# Patient Record
Sex: Male | Born: 1935
Health system: Southern US, Community
[De-identification: ages and names within clinical notes are randomized; demographics above are authoritative.]

## PROBLEM LIST (undated history)

## (undated) ENCOUNTER — Inpatient Hospital Stay: Admission: EM | Payer: Self-pay | Source: Home / Self Care

## (undated) DIAGNOSIS — I714 Abdominal aortic aneurysm, without rupture, unspecified: Secondary | ICD-10-CM

## (undated) DIAGNOSIS — I8290 Acute embolism and thrombosis of unspecified vein: Secondary | ICD-10-CM

## (undated) DIAGNOSIS — N189 Chronic kidney disease, unspecified: Secondary | ICD-10-CM

## (undated) DIAGNOSIS — J189 Pneumonia, unspecified organism: Secondary | ICD-10-CM

## (undated) DIAGNOSIS — I10 Essential (primary) hypertension: Secondary | ICD-10-CM

## (undated) DIAGNOSIS — I2510 Atherosclerotic heart disease of native coronary artery without angina pectoris: Secondary | ICD-10-CM

## (undated) DIAGNOSIS — M1990 Unspecified osteoarthritis, unspecified site: Secondary | ICD-10-CM

## (undated) DIAGNOSIS — K649 Unspecified hemorrhoids: Secondary | ICD-10-CM

## (undated) DIAGNOSIS — K219 Gastro-esophageal reflux disease without esophagitis: Secondary | ICD-10-CM

## (undated) DIAGNOSIS — E079 Disorder of thyroid, unspecified: Secondary | ICD-10-CM

## (undated) DIAGNOSIS — C801 Malignant (primary) neoplasm, unspecified: Secondary | ICD-10-CM

## (undated) HISTORY — PX: ABDOMINAL AORTIC ANEURYSM REPAIR: SUR1152

## (undated) HISTORY — PX: EYE SURGERY: SHX253

## (undated) HISTORY — DX: Abdominal aortic aneurysm, without rupture: I71.4

## (undated) HISTORY — DX: Gastro-esophageal reflux disease without esophagitis: K21.9

## (undated) HISTORY — DX: Acute embolism and thrombosis of unspecified vein: I82.90

## (undated) HISTORY — DX: Atherosclerotic heart disease of native coronary artery without angina pectoris: I25.10

## (undated) HISTORY — DX: Chronic kidney disease, unspecified: N18.9

## (undated) HISTORY — DX: Unspecified osteoarthritis, unspecified site: M19.90

## (undated) HISTORY — DX: Abdominal aortic aneurysm, without rupture, unspecified: I71.40

## (undated) HISTORY — DX: Essential (primary) hypertension: I10

## (undated) HISTORY — PX: MOUTH SURGERY: SHX715

---

## 2007-07-15 ENCOUNTER — Ambulatory Visit: Payer: Self-pay | Admitting: Vascular Surgery

## 2007-07-23 ENCOUNTER — Ambulatory Visit (HOSPITAL_COMMUNITY): Admission: RE | Admit: 2007-07-23 | Discharge: 2007-07-23 | Payer: Self-pay | Admitting: Vascular Surgery

## 2007-07-25 ENCOUNTER — Ambulatory Visit: Payer: Self-pay | Admitting: Cardiology

## 2007-07-25 ENCOUNTER — Encounter (HOSPITAL_COMMUNITY): Admission: RE | Admit: 2007-07-25 | Discharge: 2007-08-24 | Payer: Self-pay | Admitting: Vascular Surgery

## 2007-07-29 ENCOUNTER — Ambulatory Visit: Payer: Self-pay | Admitting: Vascular Surgery

## 2008-02-03 ENCOUNTER — Ambulatory Visit: Payer: Self-pay | Admitting: Vascular Surgery

## 2008-07-27 ENCOUNTER — Ambulatory Visit: Payer: Self-pay | Admitting: Vascular Surgery

## 2009-01-25 ENCOUNTER — Ambulatory Visit: Payer: Self-pay | Admitting: Vascular Surgery

## 2009-08-11 ENCOUNTER — Ambulatory Visit: Payer: Self-pay | Admitting: Vascular Surgery

## 2010-03-24 ENCOUNTER — Ambulatory Visit: Payer: Self-pay | Admitting: Vascular Surgery

## 2010-08-28 ENCOUNTER — Other Ambulatory Visit: Payer: Self-pay | Admitting: Ophthalmology

## 2010-08-28 ENCOUNTER — Encounter (HOSPITAL_COMMUNITY): Payer: Medicare Other

## 2010-08-28 LAB — BASIC METABOLIC PANEL
CO2: 23 mEq/L (ref 19–32)
Calcium: 10.2 mg/dL (ref 8.4–10.5)
Chloride: 108 mEq/L (ref 96–112)
Creatinine, Ser: 2.21 mg/dL — ABNORMAL HIGH (ref 0.4–1.5)
Glucose, Bld: 112 mg/dL — ABNORMAL HIGH (ref 70–99)

## 2010-09-04 ENCOUNTER — Ambulatory Visit (HOSPITAL_COMMUNITY)
Admission: RE | Admit: 2010-09-04 | Discharge: 2010-09-04 | Disposition: A | Discharge status: Home / Self Care | Payer: Medicare Other | Source: Ambulatory Visit | Attending: Ophthalmology | Admitting: Ophthalmology

## 2010-09-04 DIAGNOSIS — Z79899 Other long term (current) drug therapy: Secondary | ICD-10-CM | POA: Insufficient documentation

## 2010-09-04 DIAGNOSIS — Z7982 Long term (current) use of aspirin: Secondary | ICD-10-CM | POA: Insufficient documentation

## 2010-09-04 DIAGNOSIS — Z01812 Encounter for preprocedural laboratory examination: Secondary | ICD-10-CM | POA: Insufficient documentation

## 2010-09-04 DIAGNOSIS — H2589 Other age-related cataract: Secondary | ICD-10-CM | POA: Insufficient documentation

## 2010-09-04 DIAGNOSIS — I10 Essential (primary) hypertension: Secondary | ICD-10-CM | POA: Insufficient documentation

## 2010-09-05 NOTE — Procedures (Signed)
DUPLEX ULTRASOUND OF ABDOMINAL AORTA   INDICATION:  Followup abdominal aortic aneurysm.   HISTORY:  Diabetes:  No.  Cardiac:  No.  Hypertension:  Yes.  Smoking:  Previous.  Connective Tissue Disorder:  Family History:  No.  Previous Surgery:  No.   DUPLEX EXAM:         AP (cm)                   TRANSVERSE (cm)  Proximal             2.48 cm                   2.50 cm  Mid                  3.31 cm                   3.69 cm  Distal               4.84 cm                   4.93 cm  Right Iliac          1.78 cm                   cm  Left Iliac           1.65 cm                   cm   PREVIOUS:  Date:  01/25/2009  AP:  4.9  TRANSVERSE:  4.8   IMPRESSION:  Stable abdominal aortic aneurysm with largest measurement  today of 4.84 cm x 4.93 cm.   ___________________________________________  Nelda Severe. Kellie Simmering, M.D.   AS/MEDQ  D:  08/11/2009  T:  08/11/2009  Job:  [id number]

## 2010-09-05 NOTE — Procedures (Signed)
DUPLEX ULTRASOUND OF ABDOMINAL AORTA   INDICATION:  Abdominal aortic aneurysm.   HISTORY:  Diabetes:  No.  Cardiac:  No.  Hypertension:  Yes.  Smoking:  Previous.  Connective Tissue Disorder:  Family History:  No.  Previous Surgery:  No.   DUPLEX EXAM:         AP (cm)                   TRANSVERSE (cm)  Proximal             2.95 cm                   2.8 cm  Mid                  4.9 cm                    4.8 cm  Distal               4.7 cm                    4.5 cm  Right Iliac          Not visualized            Not visualized  Left Iliac           Not visualized            Not visualized   PREVIOUS:  Date:  07/27/2008  AP:  4.62  TRANSVERSE:  4.76   IMPRESSION:  1. Aneurysm of the mid to distal abdominal aorta with no significant      change in maximum diameter.  2. Unable to adequately visualize the bilateral iliac arteries due to      overlying bowel gas patterns.   ___________________________________________  Nelda Severe Kellie Simmering, M.D.   CH/MEDQ  D:  01/26/2009  T:  01/26/2009  Job:  [id number]

## 2010-09-05 NOTE — Assessment & Plan Note (Signed)
OFFICE VISIT   [name], Gerald T  DOB:  1929/03/15                                       02/03/2008  [id number]   The patient returns today for further followup regarding his abdominal  aortic aneurysm which we have been following.  He did have a CT scan in  April of this year which revealed the aneurysm to be slightly smaller  than had been appreciated on the duplex scan, which had suggested it was  5.2 cm in diameter from [facility].  That study was done in  April 2009 as well.  Today, a duplex scan in our office revealed the  aneurysm had a maximum diameter of 4.7 x 4.43 cm.  I reviewed the CT  scan again today and his neck measures about 27.5 cm in diameter below  the renal arteries, and the neck is fairly short and the aorta is quite  torturous.  I think he is very borderline as a candidate for stent  graft.  He does have mild renal insufficiency, which apparently has  improved slightly during his last check.  His creatinine has run in the  range of 1.8 in the past.  He denies any abdominal or back pain, other  than his chronic back discomfort and has no hemispheric or non-  hemispheric TIA by history, chest pain, dyspnea on exertion, PND, or  orthopnea.   PHYSICAL EXAMINATION:  Today, blood pressure is 130/84, heart rate 65,  respirations 18.  Chest is clear to auscultation.  Carotid pulses 3+  with no bruits.  Neurologic exam is normal.  Abdomen is soft and  nontender with a small pulsatile mass noted at approximately 5 cm, which  is nontender.  He has 3+ femoral and popliteal pulses bilaterally with  well-perfused lower extremities.   We will continue to follow his aneurysm since it is only 4.7 cm in  maximum diameter, and I will see him back in 6 months with a followup  duplex scan at that time.  If he develops any sudden abdominal or flank  pain, he will report to the emergency room immediately.   Nelda Severe Kellie Simmering, M.D.  Electronically Signed   JDL/MEDQ  D:  02/03/2008  T:  02/04/2008  Job:  [id number]

## 2010-09-05 NOTE — Consult Note (Signed)
VASCULAR SURGERY CONSULTATION   [name], [name]  DOB:  03-17-1929                                       07/16/2007  [id number]   The patient was referred for vascular surgery consultation by Dr. Nadara Mustard  for recently diagnosed infrarenal abdominal aortic aneurysm.  This 77-  year-old healthy gentleman has had some mild renal dysfunction over the  past few years, and underwent a renal ultrasound, which revealed a  5.2x4.8 cm infrarenal abdominal aortic aneurysm, which had not been  previously identified.  He had chronic renal disease with no asymmetry  in renal size.  He also had bilateral renal cysts.  He was not aware  that he had previously had the aneurysm.  His serum creatinine on March  9 this year was 1.84 with a BUN 34 and it was repeated 1 week later and  was 1.64.   PAST MEDICAL HISTORY:  1. Hypertension.  2. Negative for diabetes, coronary artery disease, MI, COPD, stroke,      or hyperlipidemia.   PREVIOUS SURGERY:  None.   FAMILY HISTORY:  Positive for coronary artery disease in his mother,  stroke in his father, negative for diabetes.   SOCIAL HISTORY:  He is married and has 2 children.  Is retired.  He has  not smoked cigarettes in approximately 30 years.  He does not use  alcohol.   REVIEW OF SYSTEMS:  He does have reflux esophagitis symptoms on  occasion.  He has chronic kidney disease as noted, and some diffuse arthritis.  He had 1 episode of blurred vision in the left eye lasting 10 minutes,  which has not recurred.  He has no history of TIA.   ALLERGIES:  None.   MEDICATIONS:  Ranitidine 150 mg b.i.d.  Levothyroxine 150 mg 1 daily.  Hydrochlorothiazide 50 mg 1 daily.  Lisinopril 50 mg 1 daily.   PHYSICAL EXAM:  Blood pressure 137/76, heart rate 62, respirations 14.  In general, he is a healthy-appearing male in no acute distress.  He is  alert and oriented x3.  His neck is supple.  3+ carotid pulses palpable.  No bruits are audible.   Neurologic exam is normal.  No palpable  adenopathy in the neck.  No skin rashes are noted.  Upper extremity  pulses are 3+ bilaterally.  Chest is clear to auscultation.  Cardiovascular exam reveals a regular rhythm with no murmurs.  His  abdomen is soft and nontender with a pulsatile mass in the  midepigastrium.  This approximates 5 cm in diameter.  He has 3+ femoral,  popliteal, and dorsalis pedis pulses bilaterally and 2+ posterior tibial  pulses.  No distal edema is noted.   I discussed the aneurysm with the patient today and we will proceed with  further evaluation, including a CT scan with no contrast since he does  have some mild renal insufficiency.  Will also obtain a Cardiolite and  he will return in 2 weeks for [place] to discuss this finding.   Nelda Severe Kellie Simmering, M.D.  Electronically Signed  JDL/MEDQ  D:  07/15/2007  T:  07/16/2007  Job:  [id number]   cc:   Rory Percy

## 2010-09-05 NOTE — Procedures (Signed)
DUPLEX ULTRASOUND OF ABDOMINAL AORTA   INDICATION:  Abdominal aortic aneurysm.   HISTORY:  Diabetes:  No.  Cardiac:  No.  Hypertension:  Yes.  Smoking:  Previous.  Connective Tissue Disorder:  Family History:  No.  Previous Surgery:  No.   DUPLEX EXAM:         AP (cm)                   TRANSVERSE (cm)  Proximal             2.5 cm                    2.9 cm  Mid                  3.7 cm                    4.0 cm  Distal               4.9 cm                    4.8 cm  Right Iliac          1.6 cm                    1.6 cm  Left Iliac           1.4 cm                    1.5 cm   PREVIOUS:  Date:  08/11/2009  AP:  4.84  TRANSVERSE:  4.93   IMPRESSION:  1. Aneurysmal dilatation of the mid to distal abdominal aorta with no      significant change in maximum diameter when compared to the      previous exam.  2. Doppler velocities of the abdominal aorta and bilateral common      iliac arteries appear within normal limits.   ___________________________________________  Nelda Severe Kellie Simmering, M.D.   CH/MEDQ  D:  03/24/2010  T:  03/24/2010  Job:  [id number]

## 2010-09-05 NOTE — Procedures (Signed)
DUPLEX ULTRASOUND OF ABDOMINAL AORTA   INDICATION:  Follow up abdominal aortic aneurysm.   HISTORY:  Diabetes:  No.  Cardiac:  No.  Hypertension:  Yes.  Smoking:  Quit.  Connective Tissue Disorder:  Family History:  No.  Previous Surgery:  No.   DUPLEX EXAM:         AP (cm)                   TRANSVERSE (cm)  Proximal             3.33 cm                   3.45 cm  Mid                  4.62 cm                   4.65 cm  Distal               4.46 cm                   4.76 cm  Right Iliac          1.69 cm                   1.61 cm  Left Iliac           1.65 cm                   1.61 cm   PREVIOUS:  Date: 02/03/2008  AP:  4.7  TRANSVERSE:  4.43   IMPRESSION:  Stable abdominal aortic aneurysm with largest measurement  of 4.46 cm x 4.76 cm.   ___________________________________________  Nelda Severe. Kellie Simmering, M.D.   AS/MEDQ  D:  07/27/2008  T:  07/27/2008  Job:  [id number]

## 2010-09-05 NOTE — Assessment & Plan Note (Signed)
OFFICE VISIT   Niblett, Vilas T  DOB:  1929-02-08                                       07/29/2007  [id number]   The patient returns today having had a Myoview performed or cardiac  clearance for his aneurysm surgery which we are contemplating.  His  Myoview was normal with good ventricular function and no ischemia.  He  also had a noncontrast CT scan which I reviewed.  The CT scan reveals  the diameter of the aneurysm to be significantly less than the  ultrasound revealed.  The ultrasound revealed 5.2 x 4.8 done at [facility]  and the CT scan reveals maximum diameter of 4.3 because of tortuosity of  the aneurysm.  I do not think the aneurysm needs treatment at this time  but does need close followup.  I discussed this at length with him and  his son and wife.  I also discussed symptoms that would be present if  the aneurysm leaked.   His blood pressure today is 163/78, heart rate 54, respirations 14.  He  is stable hemodynamically.  I will see him back in 6 months with a  followup duplex scan in our office since we have not performed an  ultrasound in our office before.  We will then continue to follow it on  a regular basis.  He does have mild renal insufficiency with a  creatinine of 1.8, BUN of 34 although those numbers have decreased  somewhat.   Nelda Severe Kellie Simmering, M.D.  Electronically Signed   JDL/MEDQ  D:  07/29/2007  T:  07/30/2007  Job:  [id number]   cc:   Rory Percy

## 2010-09-05 NOTE — Procedures (Signed)
DUPLEX ULTRASOUND OF ABDOMINAL AORTA   INDICATION:  Followup abdominal aortic aneurysm per CT.   HISTORY:  Diabetes:  No.  Cardiac:  No.  Hypertension:  Yes.  Smoking:  Quit about 30 years ago.  Connective Tissue Disorder:  Family History:  Previous Surgery:   DUPLEX EXAM:         AP (cm)                   TRANSVERSE (cm)  Proximal             3.49 cm                   3.65 cm  Mid                  4.27 cm                   4.09 cm  Distal               4.70 cm                   4.43 cm  Right Iliac          1.59 cm                   1.29 cm  Left Iliac           1.43 cm                   1.42 cm   PREVIOUS:  Date:  AP:  4.3 per CT  TRANSVERSE:  4.3 per CT   IMPRESSION:  A bilobar abdominal aortic aneurysm noted measuring 4.27 cm  x 4.09 cm and 4.70 cm x 4.43 cm.   ___________________________________________  Nelda Severe. Kellie Simmering, M.D.   MG/MEDQ  D:  02/03/2008  T:  02/03/2008  Job:  [id number]

## 2010-10-02 NOTE — Op Note (Signed)
  NAME:  [name], [name]                   ACCOUNT NO.:  [id number]  MEDICAL RECORD NO.:  [id number]           PATIENT TYPE:  LOCATION:                                 FACILITY:  PHYSICIAN:  Richardo Hanks, MD       DATE OF BIRTH:  [birth year]  DATE OF PROCEDURE:  09/05/2010 DATE OF DISCHARGE:  09/05/2010                              OPERATIVE REPORT   PREOPERATIVE DIAGNOSIS:  Combined cataract, right eye, diagnosis code 366.19.  POSTOPERATIVE DIAGNOSIS:  Combined cataract, right eye, diagnosis code 366.19.  PROSTHETIC DEVICE USED:  Lenstec posterior chamber lens model Softec HD. The power is 28.75, serial number is [id number].          ______________________________ Richardo Hanks, MD     KEH/MEDQ  D:  09/04/2010  T:  09/04/2010  Job:  [id number]  Electronically Signed by Tonny Branch MD on 10/02/2010 12:21:50 PM

## 2010-10-17 ENCOUNTER — Encounter (INDEPENDENT_AMBULATORY_CARE_PROVIDER_SITE_OTHER): Payer: Medicare Other

## 2010-10-17 ENCOUNTER — Ambulatory Visit (INDEPENDENT_AMBULATORY_CARE_PROVIDER_SITE_OTHER): Payer: Medicare Other | Admitting: Vascular Surgery

## 2010-10-17 DIAGNOSIS — I714 Abdominal aortic aneurysm, without rupture: Secondary | ICD-10-CM

## 2010-10-17 NOTE — Assessment & Plan Note (Signed)
OFFICE VISIT  [name] DOB:  01-14-1929                                       10/17/2010 [id number]  This patient returns today for continued follow-up regarding his infrarenal abdominal aneurysm which was diagnosed in 2009 at [facility].  This was proximal 5.2 cm initially but follow-up ultrasound in our office has always been in the 4.8 to 4.9 cm range.  He remains asymptomatic.  The previous CT scan showed the neck to be fairly short with a tortuous aorta and he was a very borderline candidate for stent graft at best.  Also some mild renal insufficiency with creatinine of 1.8 and has been followed by Dr. Lowanda Foster.  CHRONIC MEDICAL PROBLEMS: 1. Hypertension. 2. Chronic renal insufficiency. 3. Cataracts, previous surgery. 4. Negative for coronary artery disease, diabetes, COPD or stroke.  SOCIAL HISTORY:  Married.  He is retired.  He has not smoked since 1975. He does not use alcohol.  FAMILY HISTORY:  Positive for coronary artery disease and renal failure in his mother and stroke in father.  Negative for diabetes.  REVIEW OF SYSTEMS:  Negative for chest pain, dyspnea on exertion, PND, orthopnea, no claudication.  Does have chronic renal insufficiency.  All other systems are negative in complete review of systems.  PHYSICAL EXAMINATION:  Blood pressure 130/86, heart rate is 55, respirations 20.  General:  This is a well-developed, well-nourished male who is in no apparent distress, alert and oriented x3.  HEENT: Exam normal for age.  EOMs intact.  Lungs:  Clear to auscultation.  No rhonchi or wheezing.  Cardiovascular:  Regular rhythm, no murmurs. Carotid pulses 3+, no audible bruits.  Abdomen:  Soft, nontender with 5 cm pulsatile mass.  Musculoskeletal:  Free of major deformities. Neurologic:  Normal.  Skin:  Free of rashes.  Lower extremities:  Exam reveals 3+ femoral and posterior tibial pulses palpable bilaterally.  Today I  ordered a duplex scan of the aneurysm which I have reviewed and interpreted.  The maximum diameter today appears to be 4.8 x 4.7 cm.  I reassured him regarding these findings.  We will see him in 9 months with a follow-up duplex scan of the office for further evaluation.  He will also be followed by Dr. Lowanda Foster for his chronic renal insufficiency.    Nelda Severe Kellie Simmering, M.D. Electronically Signed  JDL/MEDQ  D:  10/17/2010  Walker:  10/17/2010  Job:  [id number]  cc:   Rory Percy, MD

## 2010-10-26 NOTE — Procedures (Unsigned)
DUPLEX ULTRASOUND OF ABDOMINAL AORTA  INDICATION:  Follow up abdominal aortic aneurysm.  HISTORY: Diabetes:  No. Cardiac:  No. Hypertension:  Yes. Smoking:  Previous. Connective Tissue Disorder: Family History:  No. Previous Surgery:  No.  DUPLEX EXAM:         AP (cm)                   TRANSVERSE (cm) Proximal             2.31 cm Mid                  3.24 cm                   3.37 cm Distal               4.73 cm                   4.81 cm Right Iliac          1.49 cm Left Iliac           1.64 cm  PREVIOUS:  Date: 03/24/2010  AP:  4.9  TRANSVERSE:  4.8  IMPRESSION: 1. Abdominal aortic aneurysm shows no significant changes from a     previous study done 03/24/2010. 2. Aortic and common iliac artery velocities appear within normal     limits.  ___________________________________________ Nelda Severe Kellie Simmering, M.D.  AS/MEDQ  D:  10/17/2010  T:  10/17/2010  Job:  [id number]

## 2011-07-17 ENCOUNTER — Ambulatory Visit: Payer: Medicare Other | Admitting: Vascular Surgery

## 2011-08-02 ENCOUNTER — Encounter: Payer: Self-pay | Admitting: Vascular Surgery

## 2011-08-07 ENCOUNTER — Ambulatory Visit: Payer: Medicare Other | Admitting: Vascular Surgery

## 2011-08-13 ENCOUNTER — Encounter: Payer: Self-pay | Admitting: Vascular Surgery

## 2011-08-14 ENCOUNTER — Ambulatory Visit (INDEPENDENT_AMBULATORY_CARE_PROVIDER_SITE_OTHER): Payer: Medicare Other | Admitting: Vascular Surgery

## 2011-08-14 ENCOUNTER — Encounter: Payer: Self-pay | Admitting: Vascular Surgery

## 2011-08-14 VITALS — BP 148/78 | HR 57 | Resp 16 | Ht 72.0 in | Wt 199.6 lb

## 2011-08-14 DIAGNOSIS — I714 Abdominal aortic aneurysm, without rupture, unspecified: Secondary | ICD-10-CM

## 2011-08-14 DIAGNOSIS — Z48812 Encounter for surgical aftercare following surgery on the circulatory system: Secondary | ICD-10-CM

## 2011-08-14 NOTE — Progress Notes (Signed)
Subjective:     Patient ID: Carmelina Dane, male   DOB: Sep 07, 1928, 78 y.o.   MRN: [id number]  HPI is 78 year old male patient returns for further follow up regarding his abdominal aortic aneurysm which we have been following since 2009. It has hovered around 5 cm in maximum diameter. He denies any new abdominal or back symptoms. He has had no new medical problems since the last saw him one year ago.  Past Medical History  Diagnosis Date  . AAA (abdominal aortic aneurysm)   . GERD (gastroesophageal reflux disease)   . Arthritis   . Hypertension   . Chronic kidney disease     Renal insufficiency  . Coronary artery disease     History  Substance Use Topics  . Smoking status: Former Smoker    Quit date: 07/22/1973  . Smokeless tobacco: Not on file  . Alcohol Use: No    Family History  Problem Relation Age of Onset  . Coronary artery disease Mother   . Kidney disease Mother   . Stroke Father     Not on File  Current outpatient prescriptions:fish oil-omega-3 fatty acids 1000 MG capsule, Take 2 g by mouth daily., Disp: , Rfl: ;  levothyroxine (SYNTHROID, LEVOTHROID) 150 MCG tablet, Take 150 mcg by mouth daily., Disp: , Rfl: ;  lisinopril-hydrochlorothiazide (PRINZIDE,ZESTORETIC) 10-12.5 MG per tablet, Take 1 tablet by mouth daily., Disp: , Rfl: ;  Multiple Vitamins-Minerals (MULTIVITAMIN WITH MINERALS) tablet, Take 1 tablet by mouth daily., Disp: , Rfl:  ranitidine (ZANTAC) 150 MG tablet, Take 150 mg by mouth 2 (two) times daily., Disp: , Rfl: ;  aspirin 81 MG tablet, Take 81 mg by mouth daily., Disp: , Rfl:   BP 148/78  Pulse 57  Resp 16  Ht 6' (1.829 m)  Wt 199 lb 9.6 oz (90.538 kg)  BMI 27.07 kg/m2  SpO2 98%  Body mass index is 27.07 kg/(m^2).          Review of Systems denies chest pain, dyspnea on exertion, PND, orthopnea, or Trinity claudication. Does have mild renal insufficiency with recent creatinine of 1.95. Other systems are negative and a complete review of  systems     Objective:   Physical Exam blood pressure 140/78 heart rate 57 respirations 16 Gen.-alert and oriented x3 in no apparent distress HEENT normal for age Lungs no rhonchi or wheezing Cardiovascular regular rhythm no murmurs carotid pulses 3+ palpable no bruits audible Abdomen soft nontender-5 cm pulsatile mass in mid epigastrium which is nontender.  Musculoskeletal free of  major deformities Skin clear -no rashes Neurologic normal Lower extremities 3+ femoral and dorsalis pedis pulses palpable bilaterally with no edema  Today I ordered an abdominal duplex scan which I reviewed and interpreted. The aneurysm has increased slightly in size measuring 5 x 5.1 cm     Assessment:     Infrarenal abdominal aortic aneurysm 5.1 cm maximum diameter Previous CT scan in 09 revealed that this has a short angulated neck and is not likely a stent graft candidate Discussed possible options with patient and his wife including proceeding with treatment now versus waiting 3-6 months and repeating a CT scan which they chose to do    Plan:     Return 3 months with CT scan-no contrast If patient's wife decided they would like to proceed with that they will call and we will move this up Patient will need Cardiolite prior to surgical procedure and may well require open repair

## 2011-08-16 NOTE — Progress Notes (Signed)
Addended by: Mena Goes on: 08/16/2011 08:41 AM   Modules accepted: Orders

## 2011-08-21 NOTE — Procedures (Unsigned)
DUPLEX ULTRASOUND OF ABDOMINAL AORTA  INDICATION:  Abdominal aortic aneurysm.  HISTORY: Diabetes:  No. Cardiac:  No. Hypertension:  Yes. Smoking:  Previous. Connective Tissue Disorder: Family History:  No. Previous Surgery:  No.  DUPLEX EXAM:         AP (cm)                   TRANSVERSE (cm) Proximal             2.49 cm                   2.67 cm Mid                  4.99 cm                   5.07 cm Distal               2.94 cm                   3.08 cm Right Iliac          1.34 cm                   1.48 cm Left Iliac           1.37 cm                   1.57 cm  PREVIOUS:  Date: 10/17/2010  AP:  4.73  TRANSVERSE:  4.81  IMPRESSION: 1. Infrarenal abdominal aortic aneurysm present measuring 4.99 cm x     5.07 cm, with intramural thrombus present. 2. Slight increase in diameter since previous study on 10/17/2010.  ___________________________________________ Nelda Severe. Kellie Simmering, M.D.  SH/MEDQ  D:  08/14/2011  T:  08/14/2011  Job:  [id number]

## 2011-11-12 ENCOUNTER — Encounter: Payer: Self-pay | Admitting: Vascular Surgery

## 2011-11-13 ENCOUNTER — Encounter: Payer: Self-pay | Admitting: Vascular Surgery

## 2011-11-13 ENCOUNTER — Ambulatory Visit
Admission: RE | Admit: 2011-11-13 | Discharge: 2011-11-13 | Disposition: A | Discharge status: Home / Self Care | Payer: Medicare Other | Source: Ambulatory Visit | Attending: Vascular Surgery | Admitting: Vascular Surgery

## 2011-11-13 ENCOUNTER — Ambulatory Visit (INDEPENDENT_AMBULATORY_CARE_PROVIDER_SITE_OTHER): Payer: Medicare Other | Admitting: Vascular Surgery

## 2011-11-13 ENCOUNTER — Other Ambulatory Visit: Payer: Self-pay | Admitting: *Deleted

## 2011-11-13 VITALS — BP 161/79 | HR 53 | Temp 97.9°F | Ht 72.0 in | Wt 198.0 lb

## 2011-11-13 DIAGNOSIS — Z48812 Encounter for surgical aftercare following surgery on the circulatory system: Secondary | ICD-10-CM

## 2011-11-13 DIAGNOSIS — Z01810 Encounter for preprocedural cardiovascular examination: Secondary | ICD-10-CM

## 2011-11-13 DIAGNOSIS — I714 Abdominal aortic aneurysm, without rupture: Secondary | ICD-10-CM

## 2011-11-13 NOTE — Addendum Note (Signed)
Addended by: Mena Goes on: 11/13/2011 12:47 PM   Modules accepted: Orders

## 2011-11-13 NOTE — Progress Notes (Signed)
Subjective:     Patient ID: [name], male   DOB: Mar 04, 1929, 78 y.o.   MRN: [id number]  HPI this healthy 78 year old male returns today for further followup regarding his abdominal aortic aneurysm which I have followed for the past 5-6 years. He had a CT scan today without contrast which I have reviewed by computer. The aneurysm has now increased in size to 6 cm in diameter. It extends up to the renal arteries and is not a candidate for aortic stent grafting. Today I had a long discussion with the patient's family including the sons and the wife regarding this surgery.  Past Medical History  Diagnosis Date  . AAA (abdominal aortic aneurysm)   . GERD (gastroesophageal reflux disease)   . Arthritis   . Hypertension   . Chronic kidney disease     Renal insufficiency  . Coronary artery disease     History  Substance Use Topics  . Smoking status: Former Smoker    Types: Cigarettes    Quit date: 07/22/1973  . Smokeless tobacco: Never Used  . Alcohol Use: No    Family History  Problem Relation Age of Onset  . Coronary artery disease Mother   . Kidney disease Mother   . Heart attack Mother   . Stroke Father   . Hypertension Father     No Known Allergies  Current outpatient prescriptions:aspirin 81 MG tablet, Take 81 mg by mouth daily., Disp: , Rfl: ;  fish oil-omega-3 fatty acids 1000 MG capsule, Take 2 g by mouth daily., Disp: , Rfl: ;  levothyroxine (SYNTHROID, LEVOTHROID) 150 MCG tablet, Take 150 mcg by mouth daily., Disp: , Rfl: ;  lisinopril-hydrochlorothiazide (PRINZIDE,ZESTORETIC) 10-12.5 MG per tablet, Take 1 tablet by mouth daily., Disp: , Rfl:  Multiple Vitamins-Minerals (MULTIVITAMIN WITH MINERALS) tablet, Take 1 tablet by mouth daily., Disp: , Rfl: ;  ranitidine (ZANTAC) 150 MG tablet, Take 150 mg by mouth 2 (two) times daily., Disp: , Rfl:   BP 161/79  Pulse 53  Temp 97.9 F (36.6 C) (Oral)  Ht 6' (1.829 m)  Wt 198 lb (89.812 kg)  BMI 26.85 kg/m2  SpO2  100%  Body mass index is 26.85 kg/(m^2).           Review of Systems denies chest pain, dyspnea on exertion, PND, orthopnea, claudication, lateralizing weakness.     Objective:   Physical Exam blood pressure 161/79 heart rate 53 respirations 16 Gen.-alert and oriented x3 in no apparent distress HEENT normal for age Lungs no rhonchi or wheezing Cardiovascular regular rhythm no murmurs carotid pulses 3+ palpable no bruits audible Abdomen soft nontender , small umbilical hernia, 5-6 cm pulsatile mass nontender Musculoskeletal free of  major deformities Skin clear -no rashes Neurologic normal Lower extremities 3+ femoral and dorsalis pedis pulses palpable bilaterally with no edema      Assessment:     I reviewed his CT scan today and agree that he does have a bilobed infrarenal abdominal aortic aneurysm with maximum diameter of near 6 cm. This extends to the renal arteries.    Plan:     Obtain Cardiolite for cardiac clearance #2 schedule resection and grafting of abdominal aortic aneurysm for Wednesday, August 14 Risks and benefits discussed completely with patient and family including 3% mortality rate. If patient develops any back or flank pain in the interim he will report to the emergency department immediately

## 2011-11-19 ENCOUNTER — Encounter (HOSPITAL_COMMUNITY): Payer: Self-pay | Admitting: Pharmacy Technician

## 2011-11-19 ENCOUNTER — Ambulatory Visit (HOSPITAL_COMMUNITY): Payer: Medicare Other | Attending: Cardiology | Admitting: Radiology

## 2011-11-19 VITALS — BP 136/77 | Ht 72.0 in | Wt 195.0 lb

## 2011-11-19 DIAGNOSIS — I714 Abdominal aortic aneurysm, without rupture, unspecified: Secondary | ICD-10-CM | POA: Insufficient documentation

## 2011-11-19 DIAGNOSIS — Z01810 Encounter for preprocedural cardiovascular examination: Secondary | ICD-10-CM | POA: Insufficient documentation

## 2011-11-19 DIAGNOSIS — R9431 Abnormal electrocardiogram [ECG] [EKG]: Secondary | ICD-10-CM

## 2011-11-19 MED ORDER — REGADENOSON 0.4 MG/5ML IV SOLN
0.4000 mg | Freq: Once | INTRAVENOUS | Status: AC
  Administered 2011-11-19: 0.4 mg via INTRAVENOUS

## 2011-11-19 MED ORDER — TECHNETIUM TC 99M TETROFOSMIN IV KIT
30.0000 | PACK | Freq: Once | INTRAVENOUS | Status: AC | PRN
  Administered 2011-11-19: 30 via INTRAVENOUS

## 2011-11-19 MED ORDER — TECHNETIUM TC 99M TETROFOSMIN IV KIT
10.0000 | PACK | Freq: Once | INTRAVENOUS | Status: AC | PRN
  Administered 2011-11-19: 10 via INTRAVENOUS

## 2011-11-19 NOTE — Progress Notes (Signed)
[address] [place] [address] [phone]  Cardiology Nuclear Med Study  [name] is a 78 y.o. male     MRN : [id number]     DOB: Sep 23, 1928  Procedure Date: 11/19/2011  Nuclear Med Background Indication for Stress Test:  Evaluation for Ischemia, and Surgical Clearance for  AAA Repair on 12-05-11 by Dr.James Kellie Simmering History:  AAA at 6.0cm and '09 [id number] and EF=65% Cardiac Risk Factors: Family History - CAD, History of Smoking, Hypertension and PVD  Symptoms:  no symptoms   Nuclear Pre-Procedure Caffeine/Decaff Intake:  None X 12 hrs NPO After: 6:30pm   Lungs:  clear O2 Sat: 95% on RA IV 0.9% NS with Angio Cath:  20g  IV Site: R Antecubital x 1, tolerated well IV Started by:  Irven Baltimore, RN  Chest Size (in):  46 Cup Size: n/a  Height: 6' (1.829 m)  Weight:  195 lb (88.451 kg)  BMI:  Body mass index is 26.45 kg/(m^2). Tech Comments:  n/a    Nuclear Med Study 1 or 2 day study: 1 day  Stress Test Type:  Lexiscan  Reading MD: Kirk Ruths, MD  Order Authorizing Provider:  Tinnie Gens, MD  Resting Radionuclide: Technetium 78m Tetrofosmin  Resting Radionuclide Dose: 11.0 mCi   Stress Radionuclide:  Technetium 25m Tetrofosmin  Stress Radionuclide Dose: 33.0 mCi           Stress Protocol Rest HR: 50 Stress HR: 64  Rest BP: 136/77 Stress BP: 145/85  Exercise Time (min): n/a METS: n/a   Predicted Max HR: 139 bpm % Max HR: 46.76 bpm Rate Pressure Product: 9425   Dose of Adenosine (mg):  n/a Dose of Lexiscan: 0.4 mg  Dose of Atropine (mg): n/a Dose of Dobutamine: n/a mcg/kg/min (at max HR)  Stress Test Technologist: Matilde Haymaker, RN  Nuclear Technologist:  Charlton Amor, CNMT     Rest Procedure:  Myocardial perfusion imaging was performed at rest 45 minutes following the intravenous administration of Technetium 25m Tetrofosmin. Rest ECG: Sinus Bradycardia  Stress Procedure:  The patient received IV  Lexiscan 0.4 mg over 15-seconds.  Technetium 26m Tetrofosmin injected at 30-seconds.  There were no significant changes with Lexiscan.  Quantitative spect images were obtained after a 45 minute delay. Stress ECG: No significant ST segment change suggestive of ischemia.  QPS Raw Data Images:  Acquisition technically good; normal left ventricular size. Stress Images:  Normal homogeneous uptake in all areas of the myocardium. Rest Images:  Normal homogeneous uptake in all areas of the myocardium. Subtraction (SDS):  No evidence of ischemia. Transient Ischemic Dilatation (Normal <1.22):  1.14 Lung/Heart Ratio (Normal <0.45):  0.34  Quantitative Gated Spect Images QGS EDV:  100 ml QGS ESV:  40 ml  Impression Exercise Capacity:  Lexiscan with no exercise. BP Response:  Normal blood pressure response. Clinical Symptoms:  No chest pain or dyspnea. ECG Impression:  No significant ST segment change suggestive of ischemia. Comparison with Prior Nuclear Study: No significant change from previous study  Overall Impression:  Normal stress nuclear study.  LV Ejection Fraction: 60%.  LV Wall Motion:  NL LV Function; NL Wall Motion  Kirk Ruths

## 2011-11-28 ENCOUNTER — Ambulatory Visit (HOSPITAL_COMMUNITY)
Admission: RE | Admit: 2011-11-28 | Discharge: 2011-11-28 | Disposition: A | Discharge status: Home / Self Care | Payer: PRIVATE HEALTH INSURANCE | Source: Ambulatory Visit | Attending: Vascular Surgery | Admitting: Vascular Surgery

## 2011-11-28 ENCOUNTER — Encounter (HOSPITAL_COMMUNITY): Payer: Self-pay

## 2011-11-28 ENCOUNTER — Encounter (HOSPITAL_COMMUNITY)
Admission: RE | Admit: 2011-11-28 | Discharge: 2011-11-28 | Disposition: A | Discharge status: Home / Self Care | Payer: PRIVATE HEALTH INSURANCE | Source: Ambulatory Visit | Attending: Vascular Surgery | Admitting: Vascular Surgery

## 2011-11-28 DIAGNOSIS — J438 Other emphysema: Secondary | ICD-10-CM | POA: Insufficient documentation

## 2011-11-28 DIAGNOSIS — K228 Other specified diseases of esophagus: Secondary | ICD-10-CM | POA: Insufficient documentation

## 2011-11-28 DIAGNOSIS — Z01812 Encounter for preprocedural laboratory examination: Secondary | ICD-10-CM | POA: Insufficient documentation

## 2011-11-28 DIAGNOSIS — I714 Abdominal aortic aneurysm, without rupture, unspecified: Secondary | ICD-10-CM | POA: Insufficient documentation

## 2011-11-28 DIAGNOSIS — Z01810 Encounter for preprocedural cardiovascular examination: Secondary | ICD-10-CM | POA: Insufficient documentation

## 2011-11-28 DIAGNOSIS — K2289 Other specified disease of esophagus: Secondary | ICD-10-CM | POA: Insufficient documentation

## 2011-11-28 DIAGNOSIS — Z01818 Encounter for other preprocedural examination: Secondary | ICD-10-CM | POA: Insufficient documentation

## 2011-11-28 HISTORY — DX: Unspecified hemorrhoids: K64.9

## 2011-11-28 LAB — CBC
HCT: 38.8 % — ABNORMAL LOW (ref 39.0–52.0)
Hemoglobin: 13 g/dL (ref 13.0–17.0)
MCV: 94.6 fL (ref 78.0–100.0)
RBC: 4.1 MIL/uL — ABNORMAL LOW (ref 4.22–5.81)
RDW: 13.5 % (ref 11.5–15.5)
WBC: 8.5 10*3/uL (ref 4.0–10.5)

## 2011-11-28 LAB — APTT: aPTT: 36 seconds (ref 24–37)

## 2011-11-28 LAB — BLOOD GAS, ARTERIAL
Bicarbonate: 23.2 mEq/L (ref 20.0–24.0)
TCO2: 24.4 mmol/L (ref 0–100)
pCO2 arterial: 40.5 mmHg (ref 35.0–45.0)
pH, Arterial: 7.376 (ref 7.350–7.450)
pO2, Arterial: 94.9 mmHg (ref 80.0–100.0)

## 2011-11-28 LAB — COMPREHENSIVE METABOLIC PANEL
Albumin: 3.5 g/dL (ref 3.5–5.2)
Alkaline Phosphatase: 65 U/L (ref 39–117)
BUN: 23 mg/dL (ref 6–23)
CO2: 22 mEq/L (ref 19–32)
Chloride: 111 mEq/L (ref 96–112)
Creatinine, Ser: 1.74 mg/dL — ABNORMAL HIGH (ref 0.50–1.35)
GFR calc Af Amer: 41 mL/min — ABNORMAL LOW (ref >90)
GFR calc non Af Amer: 35 mL/min — ABNORMAL LOW (ref >90)
Glucose, Bld: 88 mg/dL (ref 70–99)
Potassium: 5.1 mEq/L (ref 3.5–5.1)
Total Bilirubin: 0.5 mg/dL (ref 0.3–1.2)

## 2011-11-28 LAB — URINALYSIS, ROUTINE W REFLEX MICROSCOPIC
Glucose, UA: NEGATIVE mg/dL
Hgb urine dipstick: NEGATIVE
Ketones, ur: NEGATIVE mg/dL
Protein, ur: NEGATIVE mg/dL
Urobilinogen, UA: 0.2 mg/dL (ref 0.0–1.0)

## 2011-11-28 LAB — ABO/RH: ABO/RH(D): O POS

## 2011-11-28 NOTE — Progress Notes (Signed)
Per Arbie Cookey at Dr. Evelena Leyden office obtain PT/INR

## 2011-11-28 NOTE — Pre-Procedure Instructions (Signed)
[place]  11/28/2011   Your procedure is scheduled on:  August 14  Report to [place] at 06:30 AM.  Call this number if you have problems the morning of surgery: [phone]   Remember:   Do not eat or drink:After Midnight.  Take these medicines the morning of surgery with A SIP OF WATER: Synthroid, Zantac   STOP fish oil and multiple vitamins after today  Do not wear jewelry, make-up or nail polish.  Do not wear lotions, powders, or perfumes. You may wear deodorant.  Do not shave 48 hours prior to surgery. Men may shave face and neck.  Do not bring valuables to the hospital.  Contacts, dentures or bridgework may not be worn into surgery.  Leave suitcase in the car. After surgery it may be brought to your room.  For patients admitted to the hospital, checkout time is 11:00 AM the day of discharge.   Special Instructions: CHG Shower Use Special Wash: 1/2 bottle night before surgery and 1/2 bottle morning of surgery.   Please read over the following fact sheets that you were given: Pain Booklet, Coughing and Deep Breathing, Blood Transfusion Information and Surgical Site Infection Prevention

## 2011-11-29 NOTE — Consult Note (Addendum)
Anesthesia chart review: Patient is an 78 year old male scheduled for open repair of AAA on 12/05/2011 by Dr. Kellie Simmering.  History includes former smoker, AAA, GERD, HTN, CKD, OA, CAD (not specified and without documented history of previous coronary intervention).  PCP is listed as Dr. Rory Percy.  Dr. Ashley Akin is his Nephrologist.  EKG on 11/28/11 showed SB @ 51 bpm.  He had a normal nuclear stress test on 11/19/11.  LV Ejection Fraction: 60%. LV Wall Motion: NL LV Function; NL Wall Motion.  CXR report on 11/28/11 showed: Heart size and pulmonary vascularity are normal. There  is tortuosity of the thoracic aorta. The esophagus is dilated as demonstrated on prior CT scan. The patient has emphysematous disease at throughout both lungs. No acute osseous abnormality.   CT scan of the ABD/pelvis on 11/13/11 showed: 1. Fusiform infrarenal abdominal aortic aneurysm measures up to 6.0 cm in diameter. Bilateral common iliac artery aneurysms are also identified. No hemorrhage.  2. Single small gallstone. No evidence of cholecystitis.  3. Bilateral renal cysts.  4. Enlarged prostate gland.  5. Emphysema.  6. Fat containing umbilical hernia.  7. Fluid in the distal esophagus compatible with poor motility  and/or reflux disease.  Labs noted.  K 5.1, Cr 1.74 (improved from 2.21 in May 2012), H/H 13.0/38.8, PLT 136, coags WNL.  Labs from [facility] on 06/26/11 show a Cr 1.95.  Michaele Offer, RN at [facility] is aware of BMET results.  She will review with Dr. Kellie Simmering.  He has known CKD with a stable/improved Cr since last year.  His recent stress test was normal.  Anticipate he can proceed as planned.  Dr. Kellie Simmering can follow-up patient's renal function closely in the post-operative period.  Myra Gianotti, PA-C

## 2011-12-04 MED ORDER — DEXTROSE 5 % IV SOLN
1.5000 g | INTRAVENOUS | Status: AC
  Administered 2011-12-05: 1.5 g via INTRAVENOUS
  Filled 2011-12-04: qty 1.5

## 2011-12-05 ENCOUNTER — Inpatient Hospital Stay (HOSPITAL_COMMUNITY): Payer: Medicare Other

## 2011-12-05 ENCOUNTER — Encounter (HOSPITAL_COMMUNITY)
Admission: RE | Disposition: A | Discharge status: Rehabilitation Facility | Payer: Self-pay | Source: Ambulatory Visit | Attending: Vascular Surgery

## 2011-12-05 ENCOUNTER — Inpatient Hospital Stay (HOSPITAL_COMMUNITY): Payer: Medicare Other | Admitting: Certified Registered Nurse Anesthetist

## 2011-12-05 ENCOUNTER — Encounter (HOSPITAL_COMMUNITY): Payer: Self-pay | Admitting: *Deleted

## 2011-12-05 ENCOUNTER — Encounter (HOSPITAL_COMMUNITY): Payer: Self-pay | Admitting: Certified Registered Nurse Anesthetist

## 2011-12-05 ENCOUNTER — Inpatient Hospital Stay (HOSPITAL_COMMUNITY)
Admission: RE | Admit: 2011-12-05 | Discharge: 2011-12-13 | DRG: 237 | Disposition: A | Discharge status: Rehabilitation Facility | Payer: Medicare Other | Source: Ambulatory Visit | Attending: Vascular Surgery | Admitting: Vascular Surgery

## 2011-12-05 ENCOUNTER — Encounter (HOSPITAL_COMMUNITY): Payer: Self-pay | Admitting: Vascular Surgery

## 2011-12-05 ENCOUNTER — Inpatient Hospital Stay (HOSPITAL_COMMUNITY): Payer: Medicare Other | Admitting: Vascular Surgery

## 2011-12-05 DIAGNOSIS — D62 Acute posthemorrhagic anemia: Secondary | ICD-10-CM | POA: Diagnosis not present

## 2011-12-05 DIAGNOSIS — I214 Non-ST elevation (NSTEMI) myocardial infarction: Secondary | ICD-10-CM | POA: Diagnosis present

## 2011-12-05 DIAGNOSIS — T82898A Other specified complication of vascular prosthetic devices, implants and grafts, initial encounter: Secondary | ICD-10-CM | POA: Diagnosis present

## 2011-12-05 DIAGNOSIS — N183 Chronic kidney disease, stage 3 unspecified: Secondary | ICD-10-CM | POA: Diagnosis present

## 2011-12-05 DIAGNOSIS — I714 Abdominal aortic aneurysm, without rupture, unspecified: Principal | ICD-10-CM

## 2011-12-05 DIAGNOSIS — M129 Arthropathy, unspecified: Secondary | ICD-10-CM | POA: Diagnosis present

## 2011-12-05 DIAGNOSIS — J95821 Acute postprocedural respiratory failure: Secondary | ICD-10-CM

## 2011-12-05 DIAGNOSIS — N189 Chronic kidney disease, unspecified: Secondary | ICD-10-CM

## 2011-12-05 DIAGNOSIS — K219 Gastro-esophageal reflux disease without esophagitis: Secondary | ICD-10-CM

## 2011-12-05 DIAGNOSIS — D649 Anemia, unspecified: Secondary | ICD-10-CM | POA: Diagnosis present

## 2011-12-05 DIAGNOSIS — D696 Thrombocytopenia, unspecified: Secondary | ICD-10-CM | POA: Diagnosis present

## 2011-12-05 DIAGNOSIS — E875 Hyperkalemia: Secondary | ICD-10-CM | POA: Diagnosis present

## 2011-12-05 DIAGNOSIS — I10 Essential (primary) hypertension: Secondary | ICD-10-CM

## 2011-12-05 DIAGNOSIS — K560 Paralytic ileus: Secondary | ICD-10-CM | POA: Diagnosis present

## 2011-12-05 DIAGNOSIS — I129 Hypertensive chronic kidney disease with stage 1 through stage 4 chronic kidney disease, or unspecified chronic kidney disease: Secondary | ICD-10-CM | POA: Diagnosis present

## 2011-12-05 DIAGNOSIS — R7989 Other specified abnormal findings of blood chemistry: Secondary | ICD-10-CM

## 2011-12-05 DIAGNOSIS — I2510 Atherosclerotic heart disease of native coronary artery without angina pectoris: Secondary | ICD-10-CM

## 2011-12-05 DIAGNOSIS — R748 Abnormal levels of other serum enzymes: Secondary | ICD-10-CM

## 2011-12-05 DIAGNOSIS — E872 Acidosis, unspecified: Secondary | ICD-10-CM | POA: Diagnosis present

## 2011-12-05 DIAGNOSIS — E039 Hypothyroidism, unspecified: Secondary | ICD-10-CM | POA: Diagnosis present

## 2011-12-05 DIAGNOSIS — Y832 Surgical operation with anastomosis, bypass or graft as the cause of abnormal reaction of the patient, or of later complication, without mention of misadventure at the time of the procedure: Secondary | ICD-10-CM | POA: Diagnosis present

## 2011-12-05 DIAGNOSIS — I743 Embolism and thrombosis of arteries of the lower extremities: Secondary | ICD-10-CM

## 2011-12-05 DIAGNOSIS — I998 Other disorder of circulatory system: Secondary | ICD-10-CM

## 2011-12-05 DIAGNOSIS — Z01812 Encounter for preprocedural laboratory examination: Secondary | ICD-10-CM

## 2011-12-05 DIAGNOSIS — J9600 Acute respiratory failure, unspecified whether with hypoxia or hypercapnia: Secondary | ICD-10-CM | POA: Diagnosis present

## 2011-12-05 DIAGNOSIS — D72829 Elevated white blood cell count, unspecified: Secondary | ICD-10-CM | POA: Diagnosis present

## 2011-12-05 DIAGNOSIS — Z87891 Personal history of nicotine dependence: Secondary | ICD-10-CM

## 2011-12-05 HISTORY — PX: EMBOLECTOMY: SHX44

## 2011-12-05 HISTORY — PX: INTRAOPERATIVE ARTERIOGRAM: SHX5157

## 2011-12-05 HISTORY — PX: ABDOMINAL AORTIC ANEURYSM REPAIR: SHX42

## 2011-12-05 HISTORY — PX: ARTERY EXPLORATION: SHX5110

## 2011-12-05 LAB — BASIC METABOLIC PANEL
CO2: 22 mEq/L (ref 19–32)
Calcium: 7.8 mg/dL — ABNORMAL LOW (ref 8.4–10.5)
GFR calc Af Amer: 42 mL/min — ABNORMAL LOW (ref >90)
Sodium: 143 mEq/L (ref 135–145)

## 2011-12-05 LAB — POCT I-STAT 7, (LYTES, BLD GAS, ICA,H+H)
Acid-base deficit: 7 mmol/L — ABNORMAL HIGH (ref 0.0–2.0)
Bicarbonate: 21 mEq/L (ref 20.0–24.0)
Bicarbonate: 23.5 mEq/L (ref 20.0–24.0)
Hemoglobin: 9.9 g/dL — ABNORMAL LOW (ref 13.0–17.0)
Sodium: 141 mEq/L (ref 135–145)
TCO2: 23 mmol/L (ref 0–100)
TCO2: 25 mmol/L (ref 0–100)
pCO2 arterial: 48.9 mmHg — ABNORMAL HIGH (ref 35.0–45.0)
pH, Arterial: 7.29 — ABNORMAL LOW (ref 7.350–7.450)

## 2011-12-05 LAB — CBC
MCH: 32.3 pg (ref 26.0–34.0)
MCHC: 33.9 g/dL (ref 30.0–36.0)
MCV: 95.5 fL (ref 78.0–100.0)
Platelets: 124 10*3/uL — ABNORMAL LOW (ref 150–400)
RBC: 3.37 MIL/uL — ABNORMAL LOW (ref 4.22–5.81)
RDW: 13.7 % (ref 11.5–15.5)

## 2011-12-05 LAB — CARDIAC PANEL(CRET KIN+CKTOT+MB+TROPI)
CK, MB: 6 ng/mL — ABNORMAL HIGH (ref 0.3–4.0)
Relative Index: 3.8 — ABNORMAL HIGH (ref 0.0–2.5)
Total CK: 158 U/L (ref 7–232)

## 2011-12-05 LAB — BLOOD GAS, ARTERIAL
Acid-base deficit: 3.5 mmol/L — ABNORMAL HIGH (ref 0.0–2.0)
Drawn by: 296031
O2 Content: 6 L/min
O2 Saturation: 96.3 %
Patient temperature: 98.6
TCO2: 22.5 mmol/L (ref 0–100)
pCO2 arterial: 40.4 mmHg (ref 35.0–45.0)

## 2011-12-05 LAB — APTT: aPTT: 36 seconds (ref 24–37)

## 2011-12-05 SURGERY — EMBOLECTOMY
Anesthesia: General | Site: Leg Lower | Laterality: Right | Wound class: Clean

## 2011-12-05 SURGERY — ANEURYSM ABDOMINAL AORTIC REPAIR
Anesthesia: General | Site: Leg Lower | Laterality: Right | Wound class: Clean

## 2011-12-05 MED ORDER — SODIUM CHLORIDE 0.9 % IR SOLN
Status: DC | PRN
  Administered 2011-12-05: 22:00:00

## 2011-12-05 MED ORDER — LABETALOL HCL 5 MG/ML IV SOLN
10.0000 mg | INTRAVENOUS | Status: DC | PRN
  Administered 2011-12-05: 10 mg via INTRAVENOUS
  Filled 2011-12-05: qty 4

## 2011-12-05 MED ORDER — LISINOPRIL 10 MG PO TABS: 10.0000 mg | ORAL_TABLET | Freq: Every day | ORAL | Status: DC

## 2011-12-05 MED ORDER — DIPHENHYDRAMINE HCL 12.5 MG/5ML PO ELIX
12.5000 mg | ORAL_SOLUTION | Freq: Four times a day (QID) | ORAL | Status: DC | PRN
  Filled 2011-12-05: qty 5

## 2011-12-05 MED ORDER — SODIUM CHLORIDE 0.9 % IV SOLN
INTRAVENOUS | Status: DC | PRN
  Administered 2011-12-05: 21:00:00 via INTRAVENOUS

## 2011-12-05 MED ORDER — ONDANSETRON HCL 4 MG/2ML IJ SOLN: 4.0000 mg | Freq: Four times a day (QID) | INTRAMUSCULAR | Status: DC | PRN

## 2011-12-05 MED ORDER — LEVOTHYROXINE SODIUM 150 MCG PO TABS: 150.0000 ug | ORAL_TABLET | Freq: Every day | ORAL | Status: DC

## 2011-12-05 MED ORDER — MAGNESIUM SULFATE 40 MG/ML IJ SOLN
2.0000 g | Freq: Once | INTRAMUSCULAR | Status: AC | PRN
  Administered 2011-12-05: 2 g via INTRAVENOUS
  Filled 2011-12-05: qty 100

## 2011-12-05 MED ORDER — PROPOFOL 10 MG/ML IV EMUL
INTRAVENOUS | Status: DC | PRN
  Administered 2011-12-05: 120 mg via INTRAVENOUS
  Administered 2011-12-05: 80 mg via INTRAVENOUS

## 2011-12-05 MED ORDER — HEPARIN (PORCINE) IN NACL 100-0.45 UNIT/ML-% IJ SOLN
1200.0000 [IU]/h | INTRAMUSCULAR | Status: DC
  Administered 2011-12-06: 800 [IU]/h via INTRAVENOUS
  Administered 2011-12-07: 1200 [IU]/h via INTRAVENOUS
  Filled 2011-12-05 (×4): qty 250

## 2011-12-05 MED ORDER — SODIUM CHLORIDE 0.9 % IJ SOLN
10.0000 mL | Freq: Two times a day (BID) | INTRAMUSCULAR | Status: DC
  Administered 2011-12-06 – 2011-12-07 (×4): 10 mL via INTRAVENOUS

## 2011-12-05 MED ORDER — BIOTENE DRY MOUTH MT LIQD
15.0000 mL | Freq: Two times a day (BID) | OROMUCOSAL | Status: DC
  Administered 2011-12-05: 15 mL via OROMUCOSAL

## 2011-12-05 MED ORDER — ESMOLOL HCL 10 MG/ML IV SOLN
INTRAVENOUS | Status: DC | PRN
  Administered 2011-12-05: 50 mg via INTRAVENOUS

## 2011-12-05 MED ORDER — SODIUM CHLORIDE 0.9 % IV SOLN: 500.0000 mL | Freq: Once | INTRAVENOUS | Status: AC | PRN

## 2011-12-05 MED ORDER — CHLORHEXIDINE GLUCONATE 0.12 % MT SOLN
15.0000 mL | Freq: Two times a day (BID) | OROMUCOSAL | Status: DC
  Administered 2011-12-06 – 2011-12-12 (×15): 15 mL via OROMUCOSAL
  Filled 2011-12-05 (×18): qty 15

## 2011-12-05 MED ORDER — SODIUM CHLORIDE 0.9 % IJ SOLN
10.0000 mL | INTRAMUSCULAR | Status: DC | PRN
  Administered 2011-12-12: 10 mL via INTRAVENOUS

## 2011-12-05 MED ORDER — 0.9 % SODIUM CHLORIDE (POUR BTL) OPTIME
TOPICAL | Status: DC | PRN
  Administered 2011-12-05: 100 mL

## 2011-12-05 MED ORDER — LISINOPRIL 10 MG PO TABS
10.0000 mg | ORAL_TABLET | Freq: Every day | ORAL | Status: DC
  Filled 2011-12-05: qty 1

## 2011-12-05 MED ORDER — HYDROMORPHONE 0.3 MG/ML IV SOLN
INTRAVENOUS | Status: DC
  Administered 2011-12-05: 25 mL via INTRAVENOUS

## 2011-12-05 MED ORDER — DOPAMINE-DEXTROSE 3.2-5 MG/ML-% IV SOLN: 3.0000 ug/kg/min | INTRAVENOUS | Status: DC | PRN

## 2011-12-05 MED ORDER — FENTANYL CITRATE 0.05 MG/ML IJ SOLN
INTRAMUSCULAR | Status: DC | PRN
  Administered 2011-12-05: 50 ug via INTRAVENOUS
  Administered 2011-12-05 (×3): 100 ug via INTRAVENOUS
  Administered 2011-12-05 (×3): 50 ug via INTRAVENOUS

## 2011-12-05 MED ORDER — ONDANSETRON HCL 4 MG/2ML IJ SOLN
INTRAMUSCULAR | Status: DC | PRN
  Administered 2011-12-05: 4 mg via INTRAVENOUS

## 2011-12-05 MED ORDER — NITROGLYCERIN IN D5W 200-5 MCG/ML-% IV SOLN
INTRAVENOUS | Status: DC | PRN
  Administered 2011-12-05: 20 ug/min via INTRAVENOUS

## 2011-12-05 MED ORDER — LACTATED RINGERS IV SOLN
INTRAVENOUS | Status: DC | PRN
  Administered 2011-12-05: 23:00:00 via INTRAVENOUS

## 2011-12-05 MED ORDER — HYDROMORPHONE HCL PF 1 MG/ML IJ SOLN
0.2500 mg | INTRAMUSCULAR | Status: DC | PRN
  Administered 2011-12-05 (×2): 0.5 mg via INTRAVENOUS

## 2011-12-05 MED ORDER — ACETAMINOPHEN 325 MG PO TABS: 325.0000 mg | ORAL_TABLET | ORAL | Status: DC | PRN

## 2011-12-05 MED ORDER — PHENOL 1.4 % MT LIQD
1.0000 | OROMUCOSAL | Status: DC | PRN
  Filled 2011-12-05: qty 177

## 2011-12-05 MED ORDER — HYDROCHLOROTHIAZIDE 12.5 MG PO CAPS: 12.5000 mg | ORAL_CAPSULE | Freq: Every day | ORAL | Status: DC

## 2011-12-05 MED ORDER — LACTATED RINGERS IV SOLN
INTRAVENOUS | Status: DC | PRN
  Administered 2011-12-05 (×2): via INTRAVENOUS

## 2011-12-05 MED ORDER — LACTATED RINGERS IV SOLN
INTRAVENOUS | Status: DC | PRN
  Administered 2011-12-05 (×2): via INTRAVENOUS

## 2011-12-05 MED ORDER — DIPHENHYDRAMINE HCL 12.5 MG/5ML PO ELIX: 12.5000 mg | ORAL_SOLUTION | Freq: Four times a day (QID) | ORAL | Status: DC | PRN

## 2011-12-05 MED ORDER — DEXTROSE 5 % IV SOLN
1.5000 g | Freq: Two times a day (BID) | INTRAVENOUS | Status: AC
  Administered 2011-12-05 – 2011-12-06 (×2): 1.5 g via INTRAVENOUS
  Filled 2011-12-05 (×2): qty 1.5

## 2011-12-05 MED ORDER — SODIUM CHLORIDE 0.9 % IV SOLN: INTRAVENOUS | Status: DC

## 2011-12-05 MED ORDER — IOHEXOL 300 MG/ML  SOLN
INTRAMUSCULAR | Status: DC | PRN
  Administered 2011-12-05: 50 mL via INTRAVENOUS

## 2011-12-05 MED ORDER — SODIUM CHLORIDE 0.9 % IR SOLN
Status: DC | PRN
  Administered 2011-12-05: 10:00:00

## 2011-12-05 MED ORDER — GLYCOPYRROLATE 0.2 MG/ML IJ SOLN
INTRAMUSCULAR | Status: DC | PRN
  Administered 2011-12-05: .8 mg via INTRAVENOUS

## 2011-12-05 MED ORDER — ALUM & MAG HYDROXIDE-SIMETH 200-200-20 MG/5ML PO SUSP: 15.0000 mL | ORAL | Status: DC | PRN

## 2011-12-05 MED ORDER — 0.9 % SODIUM CHLORIDE (POUR BTL) OPTIME
TOPICAL | Status: DC | PRN
  Administered 2011-12-05: 1000 mL

## 2011-12-05 MED ORDER — DIPHENHYDRAMINE HCL 50 MG/ML IJ SOLN: 12.5000 mg | Freq: Four times a day (QID) | INTRAMUSCULAR | Status: DC | PRN

## 2011-12-05 MED ORDER — HYDRALAZINE HCL 20 MG/ML IJ SOLN
INTRAMUSCULAR | Status: DC | PRN
  Administered 2011-12-05: 5 mg via INTRAVENOUS
  Administered 2011-12-05: 10 mg via INTRAVENOUS
  Administered 2011-12-05: 5 mg via INTRAVENOUS

## 2011-12-05 MED ORDER — POTASSIUM CHLORIDE CRYS ER 20 MEQ PO TBCR: 20.0000 meq | EXTENDED_RELEASE_TABLET | Freq: Every day | ORAL | Status: DC | PRN

## 2011-12-05 MED ORDER — HEPARIN SODIUM (PORCINE) 1000 UNIT/ML IJ SOLN
INTRAMUSCULAR | Status: AC
  Filled 2011-12-05: qty 1

## 2011-12-05 MED ORDER — NALOXONE HCL 0.4 MG/ML IJ SOLN: 0.4000 mg | INTRAMUSCULAR | Status: DC | PRN

## 2011-12-05 MED ORDER — HYDROCHLOROTHIAZIDE 12.5 MG PO CAPS
12.5000 mg | ORAL_CAPSULE | Freq: Every day | ORAL | Status: DC
  Filled 2011-12-05: qty 1

## 2011-12-05 MED ORDER — PANTOPRAZOLE SODIUM 40 MG IV SOLR
40.0000 mg | Freq: Every day | INTRAVENOUS | Status: DC
  Administered 2011-12-06 – 2011-12-10 (×6): 40 mg via INTRAVENOUS
  Filled 2011-12-05 (×8): qty 40

## 2011-12-05 MED ORDER — ARTIFICIAL TEARS OP OINT
TOPICAL_OINTMENT | OPHTHALMIC | Status: DC | PRN
  Administered 2011-12-05: 1 via OPHTHALMIC

## 2011-12-05 MED ORDER — IOHEXOL 300 MG/ML  SOLN
INTRAMUSCULAR | Status: AC
  Filled 2011-12-05: qty 1

## 2011-12-05 MED ORDER — MANNITOL 25 % IV SOLN
INTRAVENOUS | Status: DC | PRN
  Administered 2011-12-05: 25 g via INTRAVENOUS

## 2011-12-05 MED ORDER — METOPROLOL TARTRATE 1 MG/ML IV SOLN
5.0000 mg | Freq: Four times a day (QID) | INTRAVENOUS | Status: DC
  Administered 2011-12-06 – 2011-12-08 (×5): 5 mg via INTRAVENOUS
  Filled 2011-12-05 (×13): qty 5

## 2011-12-05 MED ORDER — POTASSIUM CHLORIDE CRYS ER 20 MEQ PO TBCR: 20.0000 meq | EXTENDED_RELEASE_TABLET | Freq: Once | ORAL | Status: DC | PRN

## 2011-12-05 MED ORDER — HYDRALAZINE HCL 20 MG/ML IJ SOLN
10.0000 mg | INTRAMUSCULAR | Status: DC | PRN
  Filled 2011-12-05: qty 0.5

## 2011-12-05 MED ORDER — LEVOTHYROXINE SODIUM 150 MCG PO TABS
150.0000 ug | ORAL_TABLET | Freq: Every day | ORAL | Status: DC
  Filled 2011-12-05: qty 1

## 2011-12-05 MED ORDER — ALBUMIN HUMAN 5 % IV SOLN
INTRAVENOUS | Status: DC | PRN
  Administered 2011-12-05 (×3): via INTRAVENOUS

## 2011-12-05 MED ORDER — EPHEDRINE SULFATE 50 MG/ML IJ SOLN
INTRAMUSCULAR | Status: DC | PRN
  Administered 2011-12-05 (×2): 10 mg via INTRAVENOUS

## 2011-12-05 MED ORDER — ENOXAPARIN SODIUM 30 MG/0.3ML ~~LOC~~ SOLN: 30.0000 mg | SUBCUTANEOUS | Status: DC

## 2011-12-05 MED ORDER — HYDROMORPHONE 0.3 MG/ML IV SOLN
INTRAVENOUS | Status: DC
  Administered 2011-12-05: 1.79 mg via INTRAVENOUS

## 2011-12-05 MED ORDER — DOCUSATE SODIUM 100 MG PO CAPS: 100.0000 mg | ORAL_CAPSULE | Freq: Every day | ORAL | Status: DC

## 2011-12-05 MED ORDER — HYDROMORPHONE 0.3 MG/ML IV SOLN
INTRAVENOUS | Status: AC
  Filled 2011-12-05: qty 25

## 2011-12-05 MED ORDER — HYDROMORPHONE HCL PF 1 MG/ML IJ SOLN
INTRAMUSCULAR | Status: AC
  Filled 2011-12-05: qty 1

## 2011-12-05 MED ORDER — NITROGLYCERIN IN D5W 200-5 MCG/ML-% IV SOLN: 5.0000 ug/min | INTRAVENOUS | Status: DC

## 2011-12-05 MED ORDER — PROPOFOL 10 MG/ML IV BOLUS: INTRAVENOUS | Status: DC | PRN

## 2011-12-05 MED ORDER — SODIUM CHLORIDE 0.9 % IJ SOLN: 9.0000 mL | INTRAMUSCULAR | Status: DC | PRN

## 2011-12-05 MED ORDER — GUAIFENESIN-DM 100-10 MG/5ML PO SYRP: 15.0000 mL | ORAL_SOLUTION | ORAL | Status: DC | PRN

## 2011-12-05 MED ORDER — FENTANYL CITRATE 0.05 MG/ML IJ SOLN
INTRAMUSCULAR | Status: DC | PRN
  Administered 2011-12-05: 100 ug via INTRAVENOUS

## 2011-12-05 MED ORDER — SODIUM BICARBONATE 4.2 % IV SOLN
INTRAVENOUS | Status: DC | PRN
  Administered 2011-12-05: 50 meq via INTRAVENOUS

## 2011-12-05 MED ORDER — HEPARIN SODIUM (PORCINE) 1000 UNIT/ML IJ SOLN
INTRAMUSCULAR | Status: DC | PRN
  Administered 2011-12-05: 6000 [IU] via INTRAVENOUS
  Administered 2011-12-05: 2000 [IU] via INTRAVENOUS

## 2011-12-05 MED ORDER — ROCURONIUM BROMIDE 100 MG/10ML IV SOLN
INTRAVENOUS | Status: DC | PRN
  Administered 2011-12-05 (×2): 50 mg via INTRAVENOUS

## 2011-12-05 MED ORDER — ACETAMINOPHEN 650 MG RE SUPP: 325.0000 mg | RECTAL | Status: DC | PRN

## 2011-12-05 MED ORDER — SODIUM CHLORIDE 0.9 % IV SOLN
INTRAVENOUS | Status: DC | PRN
  Administered 2011-12-05: 11:00:00 via INTRAVENOUS

## 2011-12-05 MED ORDER — LISINOPRIL-HYDROCHLOROTHIAZIDE 10-12.5 MG PO TABS: 1.0000 | ORAL_TABLET | Freq: Every day | ORAL | Status: DC

## 2011-12-05 MED ORDER — SODIUM CHLORIDE 0.9 % IV SOLN
INTRAVENOUS | Status: DC
  Administered 2011-12-05: 17:00:00 via INTRAVENOUS

## 2011-12-05 MED ORDER — METOPROLOL TARTRATE 1 MG/ML IV SOLN
2.0000 mg | INTRAVENOUS | Status: DC | PRN
  Filled 2011-12-05: qty 5

## 2011-12-05 MED ORDER — PROPOFOL 10 MG/ML IV EMUL
INTRAVENOUS | Status: DC | PRN
  Administered 2011-12-05: 80 mg via INTRAVENOUS

## 2011-12-05 MED ORDER — HEPARIN SODIUM (PORCINE) 1000 UNIT/ML IJ SOLN
INTRAMUSCULAR | Status: DC | PRN
  Administered 2011-12-05: 2000 [IU] via INTRAVENOUS
  Administered 2011-12-05: 6000 [IU] via INTRAVENOUS
  Administered 2011-12-05: 5000 [IU] via INTRAVENOUS

## 2011-12-05 MED ORDER — PROTAMINE SULFATE 10 MG/ML IV SOLN
INTRAVENOUS | Status: DC | PRN
  Administered 2011-12-05: 10 mg via INTRAVENOUS
  Administered 2011-12-05: 20 mg via INTRAVENOUS
  Administered 2011-12-05 (×2): 30 mg via INTRAVENOUS
  Administered 2011-12-05 (×3): 20 mg via INTRAVENOUS

## 2011-12-05 MED ORDER — NEOSTIGMINE METHYLSULFATE 1 MG/ML IJ SOLN
INTRAMUSCULAR | Status: DC | PRN
  Administered 2011-12-05: 5 mg via INTRAVENOUS

## 2011-12-05 MED ORDER — LACTATED RINGERS IV SOLN
INTRAVENOUS | Status: DC | PRN
  Administered 2011-12-05 (×3): via INTRAVENOUS

## 2011-12-05 MED ORDER — PHENYLEPHRINE HCL 10 MG/ML IJ SOLN
10.0000 mg | INTRAVENOUS | Status: DC | PRN
  Administered 2011-12-05: 50 ug/min via INTRAVENOUS

## 2011-12-05 MED ORDER — LIDOCAINE HCL (CARDIAC) 20 MG/ML IV SOLN
INTRAVENOUS | Status: DC | PRN
  Administered 2011-12-05: 100 mg via INTRAVENOUS

## 2011-12-05 MED ORDER — LACTATED RINGERS IV SOLN
INTRAVENOUS | Status: DC | PRN
  Administered 2011-12-05 (×2): via INTRAVENOUS

## 2011-12-05 MED ORDER — PHENYLEPHRINE HCL 10 MG/ML IJ SOLN
10.0000 mg | INTRAVENOUS | Status: DC | PRN
  Administered 2011-12-05: 10 ug/min via INTRAVENOUS

## 2011-12-05 MED ORDER — VECURONIUM BROMIDE 10 MG IV SOLR
INTRAVENOUS | Status: DC | PRN
  Administered 2011-12-05: 3 mg via INTRAVENOUS

## 2011-12-05 MED ORDER — EPHEDRINE SULFATE 50 MG/ML IJ SOLN
INTRAMUSCULAR | Status: DC | PRN
  Administered 2011-12-05: 10 mg via INTRAVENOUS

## 2011-12-05 MED ORDER — HYDROMORPHONE HCL PF 1 MG/ML IJ SOLN
1.0000 mg | Freq: Once | INTRAMUSCULAR | Status: AC
  Administered 2011-12-05: 1 mg via INTRAVENOUS

## 2011-12-05 MED ORDER — ALBUMIN HUMAN 5 % IV SOLN
INTRAVENOUS | Status: DC | PRN
  Administered 2011-12-05 (×2): via INTRAVENOUS

## 2011-12-05 MED ORDER — MIDAZOLAM HCL 5 MG/5ML IJ SOLN
INTRAMUSCULAR | Status: DC | PRN
  Administered 2011-12-05: 2 mg via INTRAVENOUS

## 2011-12-05 SURGICAL SUPPLY — 78 items
BAG ISOLATION DRAPE 18X18 (DRAPES) ×2 IMPLANT
BLADE SURG 10 STRL SS (BLADE) ×3 IMPLANT
BLADE SURG 11 STRL SS (BLADE) ×3 IMPLANT
BLADE SURG 15 STRL LF DISP TIS (BLADE) ×2 IMPLANT
BLADE SURG 15 STRL SS (BLADE) ×1
CANISTER SUCTION 2500CC (MISCELLANEOUS) ×3 IMPLANT
CATH EMB 3FR 80CM (CATHETERS) ×9 IMPLANT
CATH EMB 4FR 80CM (CATHETERS) ×3 IMPLANT
CLIP TI MEDIUM 24 (CLIP) ×3 IMPLANT
CLIP TI WIDE RED SMALL 24 (CLIP) ×3 IMPLANT
CLOTH BEACON ORANGE TIMEOUT ST (SAFETY) ×3 IMPLANT
COVER SURGICAL LIGHT HANDLE (MISCELLANEOUS) ×3 IMPLANT
DRAPE INCISE IOBAN 66X45 STRL (DRAPES) ×3 IMPLANT
DRAPE ISOLATION BAG 18X18 (DRAPES) ×1
DRAPE WARM FLUID 44X44 (DRAPE) ×3 IMPLANT
DRAPE X-RAY CASS 24X20 (DRAPES) ×3 IMPLANT
DRSG COVADERM 4X14 (GAUZE/BANDAGES/DRESSINGS) ×3 IMPLANT
ELECT CAUTERY BLADE 6.4 (BLADE) ×3 IMPLANT
ELECT REM PT RETURN 9FT ADLT (ELECTROSURGICAL) ×3
ELECTRODE REM PT RTRN 9FT ADLT (ELECTROSURGICAL) ×2 IMPLANT
FELT TEFLON 4 X1 (Mesh General) ×3 IMPLANT
GLOVE BIO SURGEON STRL SZ 6.5 (GLOVE) ×6 IMPLANT
GLOVE BIOGEL PI IND STRL 7.0 (GLOVE) ×14 IMPLANT
GLOVE BIOGEL PI IND STRL 7.5 (GLOVE) ×4 IMPLANT
GLOVE BIOGEL PI INDICATOR 7.0 (GLOVE) ×7
GLOVE BIOGEL PI INDICATOR 7.5 (GLOVE) ×2
GLOVE ORTHOPEDIC STR SZ6.5 (GLOVE) ×6 IMPLANT
GLOVE SS BIOGEL STRL SZ 7 (GLOVE) ×6 IMPLANT
GLOVE SUPERSENSE BIOGEL SZ 7 (GLOVE) ×3
GLOVE SURG SS PI 7.5 STRL IVOR (GLOVE) ×9 IMPLANT
GOWN PREVENTION PLUS XLARGE (GOWN DISPOSABLE) ×6 IMPLANT
GOWN STRL NON-REIN LRG LVL3 (GOWN DISPOSABLE) ×21 IMPLANT
GRAFT HEMASHIELD 18X9MM (Vascular Products) ×3 IMPLANT
INSERT FOGARTY 61MM (MISCELLANEOUS) ×3 IMPLANT
INSERT FOGARTY SM (MISCELLANEOUS) ×6 IMPLANT
KIT BASIN OR (CUSTOM PROCEDURE TRAY) ×3 IMPLANT
KIT ROOM TURNOVER OR (KITS) ×3 IMPLANT
LOOP VESSEL MAXI BLUE (MISCELLANEOUS) IMPLANT
LOOP VESSEL MINI RED (MISCELLANEOUS) IMPLANT
NS IRRIG 1000ML POUR BTL (IV SOLUTION) ×9 IMPLANT
PACK AORTA (CUSTOM PROCEDURE TRAY) ×3 IMPLANT
PACK UNIVERSAL I (CUSTOM PROCEDURE TRAY) ×3 IMPLANT
PAD ARMBOARD 7.5X6 YLW CONV (MISCELLANEOUS) ×6 IMPLANT
PENCIL BUTTON HOLSTER BLD 10FT (ELECTRODE) ×3 IMPLANT
SET COLLECT BLD 21X3/4 12 (NEEDLE) ×3 IMPLANT
SPECIMEN JAR MEDIUM (MISCELLANEOUS) ×3 IMPLANT
SPONGE LAP 18X18 X RAY DECT (DISPOSABLE) ×3 IMPLANT
SPONGE LAP 4X18 X RAY DECT (DISPOSABLE) IMPLANT
STAPLER VISISTAT 35W (STAPLE) ×6 IMPLANT
STOPCOCK 4 WAY LG BORE MALE ST (IV SETS) ×3 IMPLANT
SUT ETHIBOND 5 LR DA (SUTURE) IMPLANT
SUT PROLENE 1 XLH 60 (SUTURE) ×6 IMPLANT
SUT PROLENE 3 0 SH1 36 (SUTURE) ×3 IMPLANT
SUT PROLENE 4 0 RB 1 (SUTURE)
SUT PROLENE 4-0 RB1 .5 CRCL 36 (SUTURE) IMPLANT
SUT PROLENE 5 0 CC 1 (SUTURE) IMPLANT
SUT PROLENE 5 0 CC1 (SUTURE) ×9 IMPLANT
SUT PROLENE 6 0 BV (SUTURE) ×9 IMPLANT
SUT PROLENE 6 0 C 1 30 (SUTURE) ×3 IMPLANT
SUT SILK 2 0 (SUTURE) ×1
SUT SILK 2 0 SH CR/8 (SUTURE) ×3 IMPLANT
SUT SILK 2-0 18XBRD TIE 12 (SUTURE) ×2 IMPLANT
SUT SILK 3 0 (SUTURE) ×1
SUT SILK 3 0 SH CR/8 (SUTURE) IMPLANT
SUT SILK 3-0 18XBRD TIE 12 (SUTURE) ×2 IMPLANT
SUT VIC AB 2-0 CTX 36 (SUTURE) ×3 IMPLANT
SUT VIC AB 3-0 MH 27 (SUTURE) ×6 IMPLANT
SUT VIC AB 3-0 SH 27 (SUTURE)
SUT VIC AB 3-0 SH 27X BRD (SUTURE) IMPLANT
SYR 30ML SLIP (SYRINGE) ×3 IMPLANT
SYR 3ML LL SCALE MARK (SYRINGE) ×3 IMPLANT
SYR TB 1ML LUER SLIP (SYRINGE) ×3 IMPLANT
TOWEL OR 17X24 6PK STRL BLUE (TOWEL DISPOSABLE) ×6 IMPLANT
TOWEL OR 17X26 10 PK STRL BLUE (TOWEL DISPOSABLE) ×12 IMPLANT
TRAY FOLEY CATH 14FRSI W/METER (CATHETERS) ×3 IMPLANT
TUBE CONNECTING 12X1/4 (SUCTIONS) ×3 IMPLANT
TUBING EXTENTION W/L.L. (IV SETS) ×3 IMPLANT
WATER STERILE IRR 1000ML POUR (IV SOLUTION) ×6 IMPLANT

## 2011-12-05 SURGICAL SUPPLY — 55 items
BANDAGE ESMARK 6X9 LF (GAUZE/BANDAGES/DRESSINGS) IMPLANT
BNDG ESMARK 6X9 LF (GAUZE/BANDAGES/DRESSINGS)
BOOT SUTURE AID YELLOW STND (SUTURE) IMPLANT
CANISTER SUCTION 2500CC (MISCELLANEOUS) ×3 IMPLANT
CATH EMB 2FR 60CM (CATHETERS) ×3 IMPLANT
CATH EMB 3FR 80CM (CATHETERS) ×3 IMPLANT
CATH EMB 4FR 80CM (CATHETERS) ×3 IMPLANT
CLIP TI MEDIUM 24 (CLIP) ×3 IMPLANT
CLIP TI WIDE RED SMALL 24 (CLIP) ×3 IMPLANT
CLOTH BEACON ORANGE TIMEOUT ST (SAFETY) ×3 IMPLANT
COVER SURGICAL LIGHT HANDLE (MISCELLANEOUS) ×3 IMPLANT
DECANTER SPIKE VIAL GLASS SM (MISCELLANEOUS) IMPLANT
DERMABOND ADVANCED (GAUZE/BANDAGES/DRESSINGS) ×1
DERMABOND ADVANCED .7 DNX12 (GAUZE/BANDAGES/DRESSINGS) ×2 IMPLANT
DRAIN SNY 10X20 3/4 PERF (WOUND CARE) ×3 IMPLANT
DRAPE WARM FLUID 44X44 (DRAPE) ×3 IMPLANT
DRAPE X-RAY CASS 24X20 (DRAPES) ×6 IMPLANT
ELECT REM PT RETURN 9FT ADLT (ELECTROSURGICAL) ×3
ELECTRODE REM PT RTRN 9FT ADLT (ELECTROSURGICAL) ×2 IMPLANT
EVACUATOR SILICONE 100CC (DRAIN) ×3 IMPLANT
GLOVE BIO SURGEON STRL SZ 6.5 (GLOVE) ×12 IMPLANT
GLOVE BIO SURGEON STRL SZ7 (GLOVE) ×9 IMPLANT
GLOVE BIOGEL PI IND STRL 7.0 (GLOVE) ×8 IMPLANT
GLOVE BIOGEL PI INDICATOR 7.0 (GLOVE) ×4
GLOVE SS BIOGEL STRL SZ 7 (GLOVE) ×2 IMPLANT
GLOVE SUPERSENSE BIOGEL SZ 7 (GLOVE) ×1
GOWN STRL NON-REIN LRG LVL3 (GOWN DISPOSABLE) ×15 IMPLANT
INSERT FOGARTY SM (MISCELLANEOUS) ×3 IMPLANT
KIT BASIN OR (CUSTOM PROCEDURE TRAY) ×3 IMPLANT
KIT ROOM TURNOVER OR (KITS) ×3 IMPLANT
NS IRRIG 1000ML POUR BTL (IV SOLUTION) ×6 IMPLANT
PACK PERIPHERAL VASCULAR (CUSTOM PROCEDURE TRAY) ×3 IMPLANT
PAD ARMBOARD 7.5X6 YLW CONV (MISCELLANEOUS) ×6 IMPLANT
PADDING CAST COTTON 6X4 STRL (CAST SUPPLIES) IMPLANT
SET COLLECT BLD 21X3/4 12 (NEEDLE) ×3 IMPLANT
SPONGE GAUZE 4X4 12PLY (GAUZE/BANDAGES/DRESSINGS) ×3 IMPLANT
STOPCOCK 4 WAY LG BORE MALE ST (IV SETS) ×3 IMPLANT
SUT PROLENE 6 0 BV (SUTURE) IMPLANT
SUT PROLENE 6 0 CC (SUTURE) ×6 IMPLANT
SUT PROLENE 7 0 BV 1 (SUTURE) IMPLANT
SUT PROLENE 7 0 BV1 MDA (SUTURE) IMPLANT
SUT SILK 2 0 SH (SUTURE) ×3 IMPLANT
SUT SILK 3 0 (SUTURE)
SUT SILK 3-0 18XBRD TIE 12 (SUTURE) IMPLANT
SUT VIC AB 2-0 CTX 36 (SUTURE) ×6 IMPLANT
SUT VIC AB 3-0 SH 27 (SUTURE) ×2
SUT VIC AB 3-0 SH 27X BRD (SUTURE) ×4 IMPLANT
SYR TB 1ML LUER SLIP (SYRINGE) ×3 IMPLANT
TAPE CLOTH SURG 4X10 WHT LF (GAUZE/BANDAGES/DRESSINGS) ×3 IMPLANT
TOWEL OR 17X24 6PK STRL BLUE (TOWEL DISPOSABLE) ×6 IMPLANT
TOWEL OR 17X26 10 PK STRL BLUE (TOWEL DISPOSABLE) ×9 IMPLANT
TRAY FOLEY CATH 14FRSI W/METER (CATHETERS) IMPLANT
TUBING EXTENTION W/L.L. (IV SETS) ×3 IMPLANT
UNDERPAD 30X30 INCONTINENT (UNDERPADS AND DIAPERS) ×3 IMPLANT
WATER STERILE IRR 1000ML POUR (IV SOLUTION) ×3 IMPLANT

## 2011-12-05 NOTE — OR Nursing (Signed)
Per Surgeon, Right Leg prepped and draped for Exploration of Right Popliteal Artery, time out @1223 , start @1226 .

## 2011-12-05 NOTE — Transfer of Care (Signed)
Immediate Anesthesia Transfer of Care Note  Patient: [name]  Procedure(s) Performed: Procedure(s) (LRB): ANEURYSM ABDOMINAL AORTIC REPAIR (N/A) ARTERY EXPLORATION (Right) EMBOLECTOMY (Right) INTRA OPERATIVE ARTERIOGRAM (Right)  Patient Location: PACU  Anesthesia Type: General  Level of Consciousness: awake, alert , oriented and patient cooperative  Airway & Oxygen Therapy: Patient Spontanous Breathing and Patient connected to face mask oxygen  Post-op Assessment: Report given to PACU RN, Post -op Vital signs reviewed and stable and Patient moving all extremities X 4  Post vital signs: Reviewed and stable  Complications: No apparent anesthesia complications

## 2011-12-05 NOTE — Anesthesia Preprocedure Evaluation (Addendum)
Anesthesia Evaluation  Patient identified by MRN, date of birth, ID band Patient awake    Reviewed: Allergy & Precautions, H&P , NPO status , Patient's Chart, lab work & pertinent test results  History of Anesthesia Complications Negative for: history of anesthetic complications  Airway Mallampati: II TM Distance: >3 FB Neck ROM: Full    Dental No notable dental hx. (+) Dental Advisory Given, Partial Upper, Teeth Intact and Implants   Pulmonary neg pulmonary ROS,  breath sounds clear to auscultation  Pulmonary exam normal       Cardiovascular Exercise Tolerance: Good hypertension, On Medications and Pt. on medications + CAD and + Peripheral Vascular Disease Rhythm:Regular Rate:Normal  CXR report on 11/28/11 showed: Heart size and pulmonary vascularity are normal. There   is tortuosity of the thoracic aorta. The esophagus is dilated as demonstrated on prior CT scan. The patient has emphysematous disease at throughout both lungs. No acute osseous abnormality.    CT scan of the ABD/pelvis on 11/13/11 showed: 1. Fusiform infrarenal abdominal aortic aneurysm measures up to 6.0 cm in diameter. Bilateral common iliac artery aneurysms are also identified. No hemorrhage.   2. Single small gallstone. No evidence of cholecystitis.   3. Bilateral renal cysts.   4. Enlarged prostate gland.   5. Emphysema.   6. Fat containing umbilical hernia.   7. Fluid in the distal esophagus compatible with poor motility   and/or reflux disease.  EKG on 11/28/11 showed SB @ 51 bpm.  He had a normal nuclear stress test on 11/19/11.  LV Ejection Fraction: 60%. LV Wall Motion: NL LV Function; NL Wall Motion    Neuro/Psych negative neurological ROS  negative psych ROS   GI/Hepatic Neg liver ROS, GERD-  Medicated and Controlled,  Endo/Other  negative endocrine ROS  Renal/GU Renal InsufficiencyRenal disease  negative genitourinary   Musculoskeletal   Abdominal   Peds  Hematology negative hematology ROS (+)   Anesthesia Other Findings   Reproductive/Obstetrics negative OB ROS                           Anesthesia Physical  Anesthesia Plan  ASA: III and Emergent  Anesthesia Plan: General   Post-op Pain Management:    Induction: Intravenous  Airway Management Planned: Oral ETT  Additional Equipment: Arterial line, CVP, PA Cath and Ultrasound Guidance Line Placement  Intra-op Plan:   Post-operative Plan: Possible Post-op intubation/ventilation  Informed Consent: I have reviewed the patients History and Physical, chart, labs and discussed the procedure including the risks, benefits and alternatives for the proposed anesthesia with the patient or authorized representative who has indicated his/her understanding and acceptance.   Dental advisory given  Plan Discussed with: CRNA, Anesthesiologist and Surgeon  Anesthesia Plan Comments:        Anesthesia Quick Evaluation

## 2011-12-05 NOTE — Anesthesia Preprocedure Evaluation (Addendum)
Anesthesia Evaluation  Patient identified by MRN, date of birth, ID band Patient awake    Reviewed: Allergy & Precautions, H&P , NPO status , Patient's Chart, lab work & pertinent test results  History of Anesthesia Complications Negative for: history of anesthetic complications  Airway Mallampati: II TM Distance: >3 FB Neck ROM: Full    Dental No notable dental hx. (+) Dental Advisory Given, Partial Upper, Teeth Intact and Implants   Pulmonary neg pulmonary ROS,  breath sounds clear to auscultation  Pulmonary exam normal       Cardiovascular Exercise Tolerance: Good hypertension, On Medications and Pt. on medications + CAD and + Peripheral Vascular Disease Rhythm:Regular Rate:Normal  CXR report on 11/28/11 showed: Heart size and pulmonary vascularity are normal. There   is tortuosity of the thoracic aorta. The esophagus is dilated as demonstrated on prior CT scan. The patient has emphysematous disease at throughout both lungs. No acute osseous abnormality.    CT scan of the ABD/pelvis on 11/13/11 showed: 1. Fusiform infrarenal abdominal aortic aneurysm measures up to 6.0 cm in diameter. Bilateral common iliac artery aneurysms are also identified. No hemorrhage.   2. Single small gallstone. No evidence of cholecystitis.   3. Bilateral renal cysts.   4. Enlarged prostate gland.   5. Emphysema.   6. Fat containing umbilical hernia.   7. Fluid in the distal esophagus compatible with poor motility   and/or reflux disease.  EKG on 11/28/11 showed SB @ 51 bpm.  He had a normal nuclear stress test on 11/19/11.  LV Ejection Fraction: 60%. LV Wall Motion: NL LV Function; NL Wall Motion    Neuro/Psych negative neurological ROS  negative psych ROS   GI/Hepatic Neg liver ROS, GERD-  Medicated and Controlled,  Endo/Other  negative endocrine ROS  Renal/GU Renal InsufficiencyRenal disease  negative genitourinary   Musculoskeletal   Abdominal   Peds  Hematology negative hematology ROS (+)   Anesthesia Other Findings   Reproductive/Obstetrics negative OB ROS                        Anesthesia Physical Anesthesia Plan  ASA: III  Anesthesia Plan: General   Post-op Pain Management:    Induction: Intravenous  Airway Management Planned: Oral ETT  Additional Equipment: Arterial line, CVP, PA Cath and Ultrasound Guidance Line Placement  Intra-op Plan:   Post-operative Plan: Extubation in OR and Possible Post-op intubation/ventilation  Informed Consent: I have reviewed the patients History and Physical, chart, labs and discussed the procedure including the risks, benefits and alternatives for the proposed anesthesia with the patient or authorized representative who has indicated his/her understanding and acceptance.   Dental advisory given  Plan Discussed with: CRNA  Anesthesia Plan Comments:         Anesthesia Quick Evaluation

## 2011-12-05 NOTE — Op Note (Signed)
OPERATIVE REPORT  Date of Surgery: 12/05/2011  Surgeon: Tinnie Gens, MD  Assistant: Johnette Abraham  Pre-op Diagnosis: AAA  Post-op Diagnosis: Abdominal Aortic Aneurysm Intraoperative embolization 2 tibial vessels right lower extremity  Procedure: Procedure(s): #1 resection and grafting of infrarenal abdominal aortic aneurysm with insertion of aorto by common iliac graft using an 18 x 9 mm Hemashield Dacron graft #2 exploration of right popliteal artery with embolectomy of posterior tibial and anterior tibial arteries with intraoperative arteriogram right leg  Anesthesia: General  EBL: [id number]  Complications: None  Procedure Details: Patient was taken to the operating room placed in the supine position at which time satisfactory general endotracheal anesthesia was administered. Monitoring lines were inserted by anesthesia. The abdomen and groins were prepped with Betadine scrub and solution draped in routine sterile manner. Midline incision was made from xiphoid to pubis carried down through subcutaneous tissues bulimia all but using the Bovie.  Cavity was entered and thoroughly explored. The stomach duodenum small bowel and colon are unremarkable. The liver was smooth with no palpable masses. Gallbladder was palpated and no stones were noted. Transverse colon was elevated and the intestines reflected to the right side exposing an infrarenal aortic aneurysm measuring approximately 6 cm in diameter. Retroperitoneum was incised exposing the aneurysm. Neck of the aneurysm was short and was exposed just distal to both renal arteries. Both common iliac arteries had posterior plaque formation or large and had excellent pulses were dissected free down to the origin of the hypogastrics. 25 g of mannitol was given intravenously and the patient was heparinized. The aorta was occluded distal to the renal arteries both common iliac arteries were occluded distally with vascular clamps. A longitudinal  opening made in the anterior aspect of aneurysm. There was a lot of soft atheromatous debris filling the aneurysm sac and the neck of the aneurysm. The inferior mesenteric artery was small and diseased but was patent. Neck of the aneurysm was transected about 2-3 cm distal to the renal arteries. The superficial layer endarterectomy was performed to remove as much debris as possible from the neck. It was a large neck. An 18 x 9 mm IMA shield Dacron graft was selected cut appropriately and anastomosed end and the proximal aortic stump using continuous 3-0 Prolene buttressing this with a strip of felt this was checked for leaks and none were present. Abundant flushing was performed with no debris being noted in the basin. Following this both common iliac arteries were transected about 1-2 cm proximal to the bifurcation. The right common iliac was slightly larger than the left and slightly more degenerative. External iliacs were widely patent bilaterally. In the anastomoses were done left followed by right to left leg open initially followed by the right leg with no significant hypotension. 5-0 Prolene was done for these anastomoses. Following completion of this there was excellent femoral pulses bilaterally. Intermesenteric artery was small and diseased and ligated at its origin with a 2-0 silk tie. Protamine was given to reverse the heparin following hemostasis aneurysm was closed over the graft continuous 3-0 Vicryl retroperitoneum approximated with 3-0 Vicryl and following thorough irrigation the linea alba closed with continuous #1 Prolene. There was also a small umbilical hernia which was repaired with the continuous Prolene suture. Subcutaneous tissue was irrigated with saline skin closed with staples. Following this the feet were examined. Left leg has a dorsalis pedis pulse palpable 3+ the right leg had no palpable pulses and appeared ischemic. It was pale. There was a palpable  posterior tibial pulse at the  beginning of the procedure. It was suspected embolization had occurred from the neck of the aneurysm therefore the right lower extremity was prepped Betadine scrub and solution draped in routine sterile manner. There was a palpable popliteal pulse at 2-3+. Medial incision was made below the knee the popliteal fossa entered. Care was taken not to injure the great saphenous vein. The popliteal artery had a good pulse was dissected free anterior tibial artery was exposed at its origin and the tibial peroneal trunk was exposed. Following this the patient was given an additional 5000 units of heparin. After occluding the popliteal artery proximally with a vessel loop transverse opening was made distally just proximal to the origin of the anterior tibial. There was no thrombus in the popliteal artery. Fogarty catheter was passed down the anterior tibial artery with good the ankle level seemed to be diseased vessel distally and some old thrombus was removed and some fresh atheromatous debris as well. There was good backbleeding. Fogarty was then passed down the tibial peroneal trunk and on one occasion went into the foot down the posterior tibial artery and a core of atheromatous debris which was fresh and fresh clot was removed. Foley was also passed down the needle artery and the small piece of atheroma was removed from this vessel as well. Enterotomy was enclosed with continuous 6-0 Prolene from both ends intraoperative arteriogram was performed which revealed the posterior tibial artery to the small apparently in spasm. Paranasal artery was also patent anterior tibial artery seen to be occluded in the mid calf area. But the skin. Therefore I gave an additional [id number] units of heparin we opened the arthrotomy directed the Fogarty catheter down the posterior tibial artery by dissecting this free at its origin. Fogarty again went down into the foot and no more thrombus was retrieved was also passed down the anterior  tibial artery once again and there was no further thrombus retrieved. Irotomy was reclosed 2 continuous 6-0 Prolene sutures clamps released there was an excellent pulse in all vessels a palpable posterior tibial pulse at the ankle. Protamine was given to reverse the heparin following adequate hemostasis wound was irrigated with saline closed in layers with Vicryl in subcuticular fashion with Dermabond patient to recovery in stable condition estimated blood loss 600 cc patient received 250 cc of blood from the Cell Saver and excellent urinary output and stable hemodynamically throughout the case Dr. Kellie Simmering concluding dictation    Tinnie Gens, MD 12/05/2011 2:17 PM

## 2011-12-05 NOTE — Progress Notes (Signed)
Labetolol 10 mg IV given per order at 1735

## 2011-12-05 NOTE — Anesthesia Postprocedure Evaluation (Signed)
  Anesthesia Post-op Note  Patient: [name]  Procedure(s) Performed: Procedure(s) (LRB): ANEURYSM ABDOMINAL AORTIC REPAIR (N/A) ARTERY EXPLORATION (Right) EMBOLECTOMY (Right) INTRA OPERATIVE ARTERIOGRAM (Right)  Patient Location: PACU  Anesthesia Type: General  Level of Consciousness: awake, oriented, sedated and patient cooperative  Airway and Oxygen Therapy: Patient Spontanous Breathing and Patient connected to face mask oxygen  Post-op Pain: moderate  Post-op Assessment: Post-op Vital signs reviewed, Patient's Cardiovascular Status Stable, Respiratory Function Stable, Patent Airway, No signs of Nausea or vomiting and Pain level controlled  Post-op Vital Signs: stable  Complications: No apparent anesthesia complications

## 2011-12-05 NOTE — Progress Notes (Signed)
MEDICATION RELATED CONSULT NOTE - INITIAL   Pharmacy Consult for Antibiotic Adjustment Indication: Post-op Zinacef  No Known Allergies  Vital Signs: Temp: 96.8 F (36 C) (08/14 1445) Temp src: Oral (08/14 0716) BP: 106/49 mmHg (08/14 1615) Pulse Rate: 80  (08/14 1615) Intake/Output from this shift: Total I/O In: 6250 [I.V.:5250; Blood:250; IV Piggyback:750] Out: 1475 [Urine:575; Blood:900]  Labs:  [facility] 12/05/11 1519 12/05/11 1331 12/05/11 1131  WBC 13.3* -- --  HGB 10.9* 9.9* 9.5*  HCT 32.2* 29.0* 28.0*  PLT 124* -- --  APTT 36 -- --  CREATININE 1.69* -- --  LABCREA -- -- --  CREATININE 1.69* -- --  CREAT24HRUR -- -- --  MG -- -- --  PHOS -- -- --  ALBUMIN -- -- --  PROT -- -- --  ALBUMIN -- -- --  AST -- -- --  ALT -- -- --  ALKPHOS -- -- --  BILITOT -- -- --  BILIDIR -- -- --  IBILI -- -- --   The CrCl is unknown because both a height and weight (above a minimum accepted value) are required for this calculation.   Microbiology: Recent Results (from the past 720 hour(s))  SURGICAL PCR SCREEN     Status: Normal   Collection Time   11/28/11 10:44 AM      Component Value Range Status Comment   MRSA, PCR NEGATIVE  NEGATIVE Final    Staphylococcus aureus NEGATIVE  NEGATIVE Final    Medications:  Anti-infectives     Start     Dose/Rate Route Frequency Ordered Stop   12/05/11 1715   cefUROXime (ZINACEF) 1.5 g in dextrose 5 % 50 mL IVPB        1.5 g 100 mL/hr over 30 Minutes Intravenous Every 12 hours 12/05/11 1703 12/06/11 1714   12/04/11 1425   cefUROXime (ZINACEF) 1.5 g in dextrose 5 % 50 mL IVPB        1.5 g 100 mL/hr over 30 Minutes Intravenous 30 min pre-op 12/04/11 1426 12/05/11 0846         Assessment: 66 YOM s/p AAA repair, artery exploration, and embolectomy on Zinacef for 2 doses post-op. CrCl~44 mL/min based on last SCr. WBC 13.3. Temperature at 96.8.   Plan:  Current Zinacef doses are ok for remainder of 2 doses.  Pharmacy will sign  off as no further adjustment needed.  Please re-consult if further changes.   Brain Hilts 12/05/2011,5:11 PM

## 2011-12-05 NOTE — H&P (View-Only) (Signed)
Subjective:     Patient ID: [name], male   DOB: 09-24-1928, 78 y.o.   MRN: [id number]  HPI this healthy 78 year old male returns today for further followup regarding his abdominal aortic aneurysm which I have followed for the past 5-6 years. He had a CT scan today without contrast which I have reviewed by computer. The aneurysm has now increased in size to 6 cm in diameter. It extends up to the renal arteries and is not a candidate for aortic stent grafting. Today I had a long discussion with the patient's family including the sons and the wife regarding this surgery.  Past Medical History  Diagnosis Date  . AAA (abdominal aortic aneurysm)   . GERD (gastroesophageal reflux disease)   . Arthritis   . Hypertension   . Chronic kidney disease     Renal insufficiency  . Coronary artery disease     History  Substance Use Topics  . Smoking status: Former Smoker    Types: Cigarettes    Quit date: 07/22/1973  . Smokeless tobacco: Never Used  . Alcohol Use: No    Family History  Problem Relation Age of Onset  . Coronary artery disease Mother   . Kidney disease Mother   . Heart attack Mother   . Stroke Father   . Hypertension Father     No Known Allergies  Current outpatient prescriptions:aspirin 81 MG tablet, Take 81 mg by mouth daily., Disp: , Rfl: ;  fish oil-omega-3 fatty acids 1000 MG capsule, Take 2 g by mouth daily., Disp: , Rfl: ;  levothyroxine (SYNTHROID, LEVOTHROID) 150 MCG tablet, Take 150 mcg by mouth daily., Disp: , Rfl: ;  lisinopril-hydrochlorothiazide (PRINZIDE,ZESTORETIC) 10-12.5 MG per tablet, Take 1 tablet by mouth daily., Disp: , Rfl:  Multiple Vitamins-Minerals (MULTIVITAMIN WITH MINERALS) tablet, Take 1 tablet by mouth daily., Disp: , Rfl: ;  ranitidine (ZANTAC) 150 MG tablet, Take 150 mg by mouth 2 (two) times daily., Disp: , Rfl:   BP 161/79  Pulse 53  Temp 97.9 F (36.6 C) (Oral)  Ht 6' (1.829 m)  Wt 198 lb (89.812 kg)  BMI 26.85 kg/m2  SpO2  100%  Body mass index is 26.85 kg/(m^2).           Review of Systems denies chest pain, dyspnea on exertion, PND, orthopnea, claudication, lateralizing weakness.     Objective:   Physical Exam blood pressure 161/79 heart rate 53 respirations 16 Gen.-alert and oriented x3 in no apparent distress HEENT normal for age Lungs no rhonchi or wheezing Cardiovascular regular rhythm no murmurs carotid pulses 3+ palpable no bruits audible Abdomen soft nontender , small umbilical hernia, 5-6 cm pulsatile mass nontender Musculoskeletal free of  major deformities Skin clear -no rashes Neurologic normal Lower extremities 3+ femoral and dorsalis pedis pulses palpable bilaterally with no edema      Assessment:     I reviewed his CT scan today and agree that he does have a bilobed infrarenal abdominal aortic aneurysm with maximum diameter of near 6 cm. This extends to the renal arteries.    Plan:     Obtain Cardiolite for cardiac clearance #2 schedule resection and grafting of abdominal aortic aneurysm for Wednesday, August 14 Risks and benefits discussed completely with patient and family including 3% mortality rate. If patient develops any back or flank pain in the interim he will report to the emergency department immediately

## 2011-12-05 NOTE — Progress Notes (Signed)
Labs drawn,ekg done  ekg shown to dr Kellie Simmering..will continue to monitor some changes were noted on ekg done in pacu postop

## 2011-12-05 NOTE — Progress Notes (Signed)
Report given to sharon rn as caregiver 

## 2011-12-05 NOTE — Preoperative (Signed)
Beta Blockers   Reason not to administer Beta Blockers:Not Applicable 

## 2011-12-05 NOTE — Interval H&P Note (Signed)
History and Physical Interval Note:  12/05/2011 8:02 AM  [name]  has presented today for surgery, with the diagnosis of AAA  The various methods of treatment have been discussed with the patient and family. After consideration of risks, benefits and other options for treatment, the patient has consented to  Procedure(s) (LRB): ANEURYSM ABDOMINAL AORTIC REPAIR (N/A) as a surgical intervention .  The patient's history has been reviewed, patient examined, no change in status, stable for surgery.  I have reviewed the patient's chart and labs.  Questions were answered to the patient's satisfaction.     Tinnie Gens

## 2011-12-05 NOTE — Op Note (Signed)
OPERATIVE REPORT  Date of Surgery: 12/05/2011  Surgeon: Tinnie Gens, MD  Assistant: Dionicio Stall  Pre-op Diagnosis: Ischemic right leg secondary to rethrombosis of tibial arteries  Post-op Diagnosis: Same  Procedure: Procedure(s): #1 thrombectomy of anterior tibial, posterior tibial, and peroneal arteries #2 vein patch angioplasty of tibioperoneal trunk right leg #3 intraoperative arteriogram right leg x2  Anesthesia: General  EBL: 200 cc Drains-one Jackson-Pratt Complications: None  Procedure Details: Patient was taken to the operating room from the surgical intensive care unit at which time satisfactory general endotracheal anesthesia. Right leg was prepped with Betadine scrub and solution draped in routine sterile manner the below knee incision or the popliteal artery had been explored earlier in the day was read opened. Popliteal artery was encircled with vessel loops as was the origin of the anterior tibial artery peroneal artery and posterior tibial artery all of which had been dissected free. Anterior tibial vein was ligated with 0 silk ties and divided to expose the tibio-peroneal trunk in its entirety. There was an excellent pulse in the popliteal artery and all of the proximal tibial vessels. 6000 units of heparin given intravenously. Longitudinal opening was made the tibio peroneal trunk after occluding the inflow. It was extended up to the very distal popliteal artery and extended distally down to the origin of the posterior tibial artery. There was no dissection and the intima it was relatively smooth. Fogarty catheter was then passed up the popliteal artery easily up to the aorta and upon return was excellent inflow with no thrombus proximally. #3 Fogarty was then passed down the anterior tibial artery would go to the dorsum of the foot but the artery was diseased particularly distally there was good backbleeding. A long core of organized thrombus probably 3 cm in length was  retrieved. Additional passes yielded no further clot. Fogarty was then passed down the perineal artery and a small piece of organized thrombus was move from the distal vessel. The ovary was then passed down the posterior tibial artery would go 50 cm past the medial malleolus. Also a long core of organized thrombus was removed from this vessel. Several passes were made and the posterior tibial artery to try to advance the Fogarty as far as possible and it would not go in and 50 cm. Heparin saline to be flush under low resistance. #2 Fogarty cath was also passed out each tibial vessel. Following this a piece of saphenous vein was removed from the operative site opened longitudinally to be used as a vein patch. The arthrotomy on the tibioperoneal was then closed with a vein patch using continuous 6-0 Prolene. Prior to completion the closure fomites were once again passed down all tibial vessels with no further thrombus removed. After completion of the closure clamps released there was an excellent pulse in the popliteal and tibial vessels in the wound and a palpable posterior tibial artery pulse at the ankle. Doppler flow was slightly obstructive in nature and the foot continues to appear ischemic-white and pale. There was Doppler flow in the peroneal artery lower third of the leg as well as the posterior tibial artery but very poor flow in the anterior tibial artery at the ankle. Intraoperative arteriogram showed very slow filling of the tibial vessels presumably because of high resistance distally with in situ thrombosis but there were no filling defects in the tibial vessels that were visualized. 2 films were taken in the vessels were never visualized past the ankle. Having run out of options it was  decided to close the wound with a drain in place and keep the patient on heparin. Thrombolytics were not an option since patient had an open abdominal aortic procedure earlier in the day. Jackson-Pratt drain was then  brought out the superior base stab wound secured with silk suture the wound closed layers of Vicryl and skin staples patient taken to surgical intensive care unit in stable condition with a palpable pulse and posterior tibial artery at the ankle. He received 2 units of packed red blood cells was stable hemodynamically throughout the case and had good urinary output   Tinnie Gens, MD 12/05/2011 11:33 PM

## 2011-12-05 NOTE — Progress Notes (Signed)
No pulses dopplerable in right foot. Cool to touch from incision to toes. Foot dusky on assessment. Pt stated unable to feel in his right foot pain at the site of the incision. Vivia Birmingham, RN and Emerson Monte, RN to assess. Dr Kellie Simmering made aware. Will come by to assess. Will continue to monitor pt closely

## 2011-12-05 NOTE — Progress Notes (Signed)
Patient ID: [name], male   DOB: 1928/10/03, 78 y.o.   MRN: [id number] Vascular Surgery Progress Note  Subjective: Patient now 6 hours post resection and grafting of abdominal aortic aneurysm. Patient developed ischemic right lower extremity while still in the OR noted immediately following aneurysm resection. Right popliteal artery was explored with embolectomy of all tibial vessels. Patient had palpable pulse in PACU and in surgical intensive care unit but now that has become non-and flow in right foot has disappeared. symptoms in left leg.  Objective:  Filed Vitals:   12/05/11 2000  BP: 112/61  Pulse: 69  Temp: 98.4 F (36.9 C)  Resp: 14    General patient alert and oriented in surgical intensive care unit Lungs no rhonchi or wheezing Abdomen appropriate for postop aneurysm resection Right leg with 3+ femoral 2+ popliteal pulse palpable. Right foot cold and pale with no audible Doppler flow. No sensation right foot periods some motion in right foot. Left foot normal with 2-3+ dorsalis pedis pulse palpable.   Labs:  Lab 12/05/11 1519  CREATININE 1.69*    Lab 12/05/11 1519 12/05/11 1331 12/05/11 1131  NA 143 142 141  K 4.7 5.3* 4.5  CL 110 -- --  CO2 22 -- --  BUN 23 -- --  CREATININE 1.69* -- --  LABGLOM -- -- --  GLUCOSE 148* -- --  CALCIUM 7.8* -- --    Lab 12/05/11 1519 12/05/11 1331 12/05/11 1131  WBC 13.3* -- --  HGB 10.9* 9.9* 9.5*  HCT 32.2* 29.0* 28.0*  PLT 124* -- --    Lab 12/05/11 1519  INR 1.21    I/O last 3 completed shifts: In: 6451.7 [I.V.:5401.7; Blood:250; IV Piggyback:800] Out: [id number] [Urine:930; Blood:900]  Imaging: Dg Ang/ext/uni/or Right  12/05/2011  *RADIOLOGY REPORT*  Clinical Data: Right leg embolectomy.  RIGHT ANG/EXT/UNI/ OR  Comparison:  None.  Findings: Intraoperative image demonstrates opacification of the native popliteal artery which appears normally patent from the level of the knee joint into the proximal calf.  The posterior  tibial artery is patent to the ankle.  The anterior tibial artery has a filling defect at roughly the juncture of the mid and distal calf.  IMPRESSION: Distal anterior tibial artery filling defect.  Original Report Authenticated By: Azzie Roup, M.D.   Dg Chest Portable 1 View  12/05/2011  *RADIOLOGY REPORT*  Clinical Data: Swan-Ganz catheter placement.  Abdominal aortic aneurysm repair.  PORTABLE CHEST - 1 VIEW 12/05/2011 1540 hours:  Comparison: Two-view chest x-ray 11/28/2011.  Findings: Swan-Ganz catheter tip in the right main pulmonary artery.  No evidence of pneumothorax mediastinal hematoma. Nasogastric tube courses below the diaphragm into the stomach. Cardiac silhouette enlarged.  Moderate diffuse interstitial and airspace pulmonary edema.  Linear atelectasis at the right lung base.  Bilateral pleural effusions, right greater than left.  IMPRESSION: Support apparatus satisfactory.  No pneumothorax.  Moderate CHF and/or fluid overload.  Bilateral pleural effusions, right greater than left.  Linear atelectasis at the right lung base.  Original Report Authenticated By: Deniece Portela, M.D.    Assessment/Plan:  POD #0  LOS: 0 days  s/p Procedure(s): Patient has developed recurrent ischemia right lower extremity following aneurysm resection. It was suspected that he embolized debris from the aneurysm neck or intraoperatively to cause initial event. Now with recurrent ischemia uncertain whether this is due to in situ thrombosis in right foot since patient did have palpable posterior tibial pulse following tibial embolectomy. Right anterior tibial artery is the  more diseased of his tibial vessels and he did not have palpable anterior tibial pulse preoperatively. Intraoperative arteriogram was performed earlier today which revealed small vessels with posterior tibial patent to the ankle level  Patient also has mild abnormality of cardiac enzymes with CPK MB equals 6.0, troponin 0.72, relative  index 3.8. Discussed situation with Dr. Claiborne Billings who is on call for [facility] cardiology. He thinks this may not represent cardiac event. Patient had normal Lexascan preoperatively with good ejection fraction. He will see patient in formal consultation postoperatively in surgical intensive care he  Plan return patient emergently to operating room for popliteal exploration and repeat tibial embolectomy to try to achieve limb salvage   discussed situation with the son Greg-Gasper and they understand the situation and are in agreement with returning patient to OR. Cardiac risks discussed   Tinnie Gens, MD 12/05/2011 8:58 PM

## 2011-12-06 ENCOUNTER — Inpatient Hospital Stay (HOSPITAL_COMMUNITY): Payer: Medicare Other

## 2011-12-06 ENCOUNTER — Encounter (HOSPITAL_COMMUNITY): Payer: Self-pay | Admitting: Vascular Surgery

## 2011-12-06 DIAGNOSIS — I998 Other disorder of circulatory system: Secondary | ICD-10-CM

## 2011-12-06 DIAGNOSIS — J9589 Other postprocedural complications and disorders of respiratory system, not elsewhere classified: Secondary | ICD-10-CM

## 2011-12-06 DIAGNOSIS — J95821 Acute postprocedural respiratory failure: Secondary | ICD-10-CM

## 2011-12-06 DIAGNOSIS — I999 Unspecified disorder of circulatory system: Secondary | ICD-10-CM

## 2011-12-06 DIAGNOSIS — I2510 Atherosclerotic heart disease of native coronary artery without angina pectoris: Secondary | ICD-10-CM

## 2011-12-06 DIAGNOSIS — I714 Abdominal aortic aneurysm, without rupture, unspecified: Secondary | ICD-10-CM

## 2011-12-06 DIAGNOSIS — I10 Essential (primary) hypertension: Secondary | ICD-10-CM

## 2011-12-06 DIAGNOSIS — N189 Chronic kidney disease, unspecified: Secondary | ICD-10-CM

## 2011-12-06 DIAGNOSIS — K219 Gastro-esophageal reflux disease without esophagitis: Secondary | ICD-10-CM

## 2011-12-06 LAB — CBC
HCT: 33.7 % — ABNORMAL LOW (ref 39.0–52.0)
HCT: 32.1 % — ABNORMAL LOW (ref 39.0–52.0)
HCT: 31.5 % — ABNORMAL LOW (ref 39.0–52.0)
Hemoglobin: 11.3 g/dL — ABNORMAL LOW (ref 13.0–17.0)
Hemoglobin: 10.7 g/dL — ABNORMAL LOW (ref 13.0–17.0)
MCH: 31.5 pg (ref 26.0–34.0)
MCHC: 33.5 g/dL (ref 30.0–36.0)
MCV: 93.9 fL (ref 78.0–100.0)
RBC: 3.38 MIL/uL — ABNORMAL LOW (ref 4.22–5.81)
RDW: 14.7 % (ref 11.5–15.5)
RDW: 14.9 % (ref 11.5–15.5)
WBC: 14.3 10*3/uL — ABNORMAL HIGH (ref 4.0–10.5)
WBC: 14.3 10*3/uL — ABNORMAL HIGH (ref 4.0–10.5)

## 2011-12-06 LAB — POCT I-STAT 3, ART BLOOD GAS (G3+)
Acid-base deficit: 4 mmol/L — ABNORMAL HIGH (ref 0.0–2.0)
Acid-base deficit: 3 mmol/L — ABNORMAL HIGH (ref 0.0–2.0)
Bicarbonate: 22.3 mEq/L (ref 20.0–24.0)
O2 Saturation: 90 %
Patient temperature: 98.6
TCO2: 23 mmol/L (ref 0–100)
TCO2: 24 mmol/L (ref 0–100)
pCO2 arterial: 44.6 mmHg (ref 35.0–45.0)
pO2, Arterial: 67 mmHg — ABNORMAL LOW (ref 80.0–100.0)

## 2011-12-06 LAB — BLOOD GAS, ARTERIAL
Acid-base deficit: 5.6 mmol/L — ABNORMAL HIGH (ref 0.0–2.0)
Drawn by: 331761
FIO2: 0.4 %
O2 Saturation: 98.2 %
Patient temperature: 97.3
RATE: 15 resp/min
pO2, Arterial: 84.5 mmHg (ref 80.0–100.0)

## 2011-12-06 LAB — CARDIAC PANEL(CRET KIN+CKTOT+MB+TROPI)
CK, MB: 25.6 ng/mL (ref 0.3–4.0)
Relative Index: 2.9 — ABNORMAL HIGH (ref 0.0–2.5)
Relative Index: 2.6 — ABNORMAL HIGH (ref 0.0–2.5)
Total CK: 980 U/L — ABNORMAL HIGH (ref 7–232)

## 2011-12-06 LAB — COMPREHENSIVE METABOLIC PANEL
Albumin: 3 g/dL — ABNORMAL LOW (ref 3.5–5.2)
Alkaline Phosphatase: 40 U/L (ref 39–117)
BUN: 23 mg/dL (ref 6–23)
Creatinine, Ser: 1.88 mg/dL — ABNORMAL HIGH (ref 0.50–1.35)
GFR calc Af Amer: 37 mL/min — ABNORMAL LOW (ref >90)
Glucose, Bld: 153 mg/dL — ABNORMAL HIGH (ref 70–99)
Potassium: 5.6 mEq/L — ABNORMAL HIGH (ref 3.5–5.1)
Total Bilirubin: 1 mg/dL (ref 0.3–1.2)
Total Protein: 5 g/dL — ABNORMAL LOW (ref 6.0–8.3)

## 2011-12-06 LAB — BASIC METABOLIC PANEL
BUN: 27 mg/dL — ABNORMAL HIGH (ref 6–23)
Chloride: 111 mEq/L (ref 96–112)
GFR calc Af Amer: 31 mL/min — ABNORMAL LOW (ref >90)
Potassium: 4.8 mEq/L (ref 3.5–5.1)
Sodium: 142 mEq/L (ref 135–145)

## 2011-12-06 LAB — HEPARIN LEVEL (UNFRACTIONATED)
Heparin Unfractionated: 0.41 IU/mL (ref 0.30–0.70)
Heparin Unfractionated: 0.19 IU/mL — ABNORMAL LOW (ref 0.30–0.70)

## 2011-12-06 LAB — AMYLASE: Amylase: 94 U/L (ref 0–105)

## 2011-12-06 LAB — MAGNESIUM: Magnesium: 1.6 mg/dL (ref 1.5–2.5)

## 2011-12-06 MED ORDER — BIOTENE DRY MOUTH MT LIQD
15.0000 mL | Freq: Four times a day (QID) | OROMUCOSAL | Status: DC
  Administered 2011-12-06 – 2011-12-13 (×20): 15 mL via OROMUCOSAL

## 2011-12-06 MED ORDER — FENTANYL CITRATE 0.05 MG/ML IJ SOLN
12.5000 ug | INTRAMUSCULAR | Status: DC | PRN
  Administered 2011-12-06 (×2): 25 ug via INTRAVENOUS
  Filled 2011-12-06 (×2): qty 2

## 2011-12-06 MED ORDER — LEVOTHYROXINE SODIUM 100 MCG IV SOLR
75.0000 ug | Freq: Every day | INTRAVENOUS | Status: DC
  Administered 2011-12-06 – 2011-12-10 (×5): 76 ug via INTRAVENOUS
  Filled 2011-12-06 (×6): qty 3.8

## 2011-12-06 MED ORDER — MIDAZOLAM HCL 2 MG/2ML IJ SOLN
INTRAMUSCULAR | Status: AC
  Administered 2011-12-06: 2 mg
  Filled 2011-12-06: qty 2

## 2011-12-06 MED ORDER — MIDAZOLAM BOLUS VIA INFUSION
1.0000 mg | INTRAVENOUS | Status: DC | PRN
  Filled 2011-12-06: qty 2

## 2011-12-06 MED ORDER — FENTANYL BOLUS VIA INFUSION
50.0000 ug | Freq: Four times a day (QID) | INTRAVENOUS | Status: DC | PRN
  Filled 2011-12-06: qty 100

## 2011-12-06 MED ORDER — SODIUM CHLORIDE 0.9 % IV SOLN
2.0000 mg/h | INTRAVENOUS | Status: DC
  Administered 2011-12-06: 4 mg/h via INTRAVENOUS
  Filled 2011-12-06: qty 10

## 2011-12-06 MED ORDER — CHLORHEXIDINE GLUCONATE 0.12 % MT SOLN: 15.0000 mL | Freq: Two times a day (BID) | OROMUCOSAL | Status: DC

## 2011-12-06 MED ORDER — HEPARIN (PORCINE) IN NACL 2-0.9 UNIT/ML-% IJ SOLN: INTRAMUSCULAR | Status: DC

## 2011-12-06 MED ORDER — SODIUM CHLORIDE 0.9 % IV SOLN
50.0000 ug/h | INTRAVENOUS | Status: DC
  Administered 2011-12-06: 100 ug/h via INTRAVENOUS
  Filled 2011-12-06: qty 50

## 2011-12-06 MED ORDER — CHLORHEXIDINE GLUCONATE 0.12 % MT SOLN
OROMUCOSAL | Status: AC
  Filled 2011-12-06: qty 15

## 2011-12-06 MED ORDER — PANTOPRAZOLE SODIUM 40 MG IV SOLR: 40.0000 mg | Freq: Every day | INTRAVENOUS | Status: DC

## 2011-12-06 MED ORDER — HYDROMORPHONE 0.3 MG/ML IV SOLN
INTRAVENOUS | Status: DC
  Administered 2011-12-06: 1.68 mg via INTRAVENOUS
  Administered 2011-12-07: 3.59 mg via INTRAVENOUS
  Administered 2011-12-07: 0.39 mg via INTRAVENOUS
  Administered 2011-12-07 (×2): 1.79 mg via INTRAVENOUS
  Administered 2011-12-07: 2.99 mg via INTRAVENOUS
  Administered 2011-12-07: 06:00:00 via INTRAVENOUS
  Administered 2011-12-08: 2.19 mg via INTRAVENOUS
  Administered 2011-12-08: 5.99 mg via INTRAVENOUS
  Administered 2011-12-08: 2.19 mg via INTRAVENOUS
  Administered 2011-12-08: 1.99 mg via INTRAVENOUS
  Administered 2011-12-08: 09:00:00 via INTRAVENOUS
  Administered 2011-12-08: 2.19 mg via INTRAVENOUS
  Administered 2011-12-08: 2.39 mg via INTRAVENOUS
  Administered 2011-12-09: 3.19 mg via INTRAVENOUS
  Administered 2011-12-09: 15:00:00 via INTRAVENOUS
  Administered 2011-12-09: 2.25 mg via INTRAVENOUS
  Administered 2011-12-09: 0.399 mg via INTRAVENOUS
  Filled 2011-12-06 (×6): qty 25

## 2011-12-06 NOTE — Procedures (Signed)
Extubation Procedure Note  Patient Details:   Name: [name] DOB: 05-12-1928 MRN: [id number]   Airway Documentation:     Evaluation  O2 sats: stable throughout and currently acceptable Complications: No apparent complications Patient did tolerate procedure well. Bilateral Breath Sounds: Clear   Yes Pt awake and alert, extubated per MD order, placed on 4L [place], sat 93%. Positive cuff leak, BBS CL. Pt able to vocalize.  Vallery Sa 12/06/2011, 8:51 AM

## 2011-12-06 NOTE — Progress Notes (Signed)
CRITICAL VALUE ALERT  Critical value received:  CK MB 9.4  Date of notification:  12/06/11  Time of notification:  0300  Critical value read back:yes  Nurse who received alert:  Cathe Mons  MD notified (1st page):  Dr Claiborne Billings  Time of first page:  0500  MD notified (2nd page):  Time of second page:  Responding MD:  Dr Claiborne Billings  Time MD responded:  [phone]

## 2011-12-06 NOTE — Progress Notes (Signed)
BY EPIC ETT was removed at 2343, pt still intubated and ETT removed at [place].

## 2011-12-06 NOTE — Progress Notes (Signed)
Name: [name] MRN: [id number] DOB: June 19, 1928    LOS: 1  Referring Provider:  Kellie Simmering Reason for Referral:  VDRF post vascular surgery  PULMONARY / CRITICAL CARE MEDICINE  HPI:  This is an 78 y/o male with HTN, GERD, and AAA who underwent resection and grafting of an infrarenal AAA with insertion of a common iliac graft and exploration of R popliteal artery with embolectomy of posterior tibial and anterior tibial arteries with intraoperative arteriogram of R leg on 12/05/11.  6 hours after the case he lost pulses in the R foot so he was taken back to the OR for thrombectomy of the anterior tibial, posterior tibial, and peroneal arteries.  After that procedure PCCM was consulted for vent management.  Brief patient description:  78 y/o male s/p AAA repair and R ant tibial/post tibial/peroneal arterial thrombectomy.  PCCM consulted for vent management.  Events Since Admission: 12/05/11  #1 thrombectomy of anterior tibial, posterior tibial, and peroneal arteries  #2 vein patch angioplasty of tibioperoneal trunk right leg  #3 intraoperative arteriogram right leg x2  12/05/11 #1 resection and grafting of infrarenal abdominal aortic aneurysm with insertion of aorto by common iliac graft using an 18 x 9 mm Hemashield Dacron graft  #2 exploration of right popliteal artery with embolectomy of posterior tibial and anterior tibial arteries with intraoperative arteriogram right leg  Current Status:  Awake, sedation lifted Tolerating PSV5  Vital Signs: Temp:  [96.8 F (36 C)-99.3 F (37.4 C)] 99.1 F (37.3 C) (08/15 0744) Pulse Rate:  [62-91] 79  (08/15 0834) Resp:  [12-23] 21  (08/15 0834) BP: (80-155)/(41-82) 131/62 mmHg (08/15 0834) SpO2:  [93 %-99 %] 94 % (08/15 0834) Arterial Line BP: (97-201)/(37-78) 146/63 mmHg (08/15 0715) FiO2 (%):  [40 %-50 %] 40 % (08/15 0834) Weight:  [89.812 kg (198 lb)] 89.812 kg (198 lb) (08/14 1718)  Physical Examination: Gen: ill appearing, awake,  calm HEENT: ETT, PA-C, GT in place Lungs: decreased at bases, no wheezes CV: regular, no M, R foot cool Abd: soft, NT, + BS Ext: no edema, R foot cool, L warmer Neuro: awake, moves all ext   Principal Problem:  *AAA (abdominal aortic aneurysm) Active Problems:  Postoperative respiratory failure  Ischemic leg  CKD  GERD (gastroesophageal reflux disease)  CAD (coronary artery disease)  HTN (hypertension)   ASSESSMENT AND PLAN  PULMONARY  Lab 12/06/11 0739 12/06/11 0629 12/06/11 0124 12/05/11 1510 12/05/11 1331  PHART 7.334* 7.298* 7.292* 7.342* 7.290*  PCO2ART 42.0 44.6 42.2 40.4 48.9*  PO2ART 72.0* 67.0* 84.5 80.3 308.0*  HCO3 22.3 21.8 20.0 21.3 23.5  O2SAT 93.0 90.0 98.2 96.3 100.0   Ventilator Settings: Vent Mode:  [-] CPAP FiO2 (%):  [40 %-50 %] 40 % Set Rate:  [15 bmp] 15 bmp Vt Set:  [620 mL] 620 mL PEEP:  [5 cmH20] 5 cmH20 Pressure Support:  [5 cmH20-10 cmH20] 10 cmH20 Plateau Pressure:  [16 cmH20-17 cmH20] 16 cmH20 CXR:  Bilat airspace disease and effusions, likely pulm edema ETT:  12/05/11  A:  Hypoxemic respiratory failure, post operative respiratory failure; Pulmonary edema related to volume given during multiple surgeries 8/14 P:   -tolerating PSV, goal extubation if no hesitation by vascular surgery  CARDIOVASCULAR  Lab 12/06/11 0255 12/06/11 0240 12/05/11 1920  TROPONINI 1.01* -- 0.72*  LATICACIDVEN -- 2.6* --  PROBNP -- -- --   ECG:  NSR, no ST wave changes Lines: R SGC 8/14 >>  L radial a line 8/14 >>  A:  AAA s/p repair Ischaemic R foot, s/p repair Known CAD, mildly elevated Troponin HTN, BP stable  P:  -cardiology following, started on metoprolol IV scheduled -hep gtt -hold home lisinopril and HCTZ for now  RENAL  Lab 12/06/11 0230 12/05/11 2135 12/05/11 1519 12/05/11 1331 12/05/11 1131  NA 141 143 143 142 141  K 5.6* 5.1 -- -- --  CL 110 -- 110 -- --  CO2 23 -- 22 -- --  BUN 23 -- 23 -- --  CREATININE 1.88* -- 1.69* -- --   CALCIUM 7.6* -- 7.8* -- --  MG 1.6 -- 1.2* -- --  PHOS -- -- -- -- --   Intake/Output      08/14 0701 - 08/15 0700 08/15 0701 - 08/16 0700   I.V. (mL/kg) 9133.2 (101.7)    Blood 810    NG/GT 120    IV Piggyback 1410    Total Intake(mL/kg) 11473.2 (127.7)    Urine (mL/kg/hr) 1640 (0.8)    Emesis/NG output 100    Drains 80    Blood 900    Total Output 2720    Net +8753.2          Foley:  8/14  A:  CKD, s/p multiple procedures aortograms, now w apparent evolving acute renal injury hyperkalemia P:   -repeat BMET this pm and in am -treat hyperkalemia if rising -hold off on ACE-I, HCTZ, other renal toxic meds -IVF as he can tolerate  GASTROINTESTINAL  Lab 12/06/11 0230  AST 90*  ALT 92*  ALKPHOS 40  BILITOT 1.0  PROT 5.0*  ALBUMIN 3.0*    A:  No acute issues P:   -sips/chips post extubation, advance as tolerated  HEMATOLOGIC  Lab 12/06/11 0230 12/05/11 2135 12/05/11 1519 12/05/11 1331 12/05/11 1131  HGB 11.3* 10.2* 10.9* 9.9* 9.5*  HCT 33.7* 30.0* 32.2* 29.0* 28.0*  PLT 109* -- 124* -- --  INR -- -- 1.21 -- --  APTT 193* -- 36 -- --   A:  Anemia, Hgb stable P:  -monitor H/H  INFECTIOUS  Lab 12/06/11 0230 12/05/11 1519  WBC 13.5* 13.3*  PROCALCITON -- --   Cultures:  Antibiotics:   A:  No acute issues P:   -monitor fever curve  ENDOCRINE No results found for this basename: GLUCAP:5 in the last 168 hours A:  Hypothyroid   P:   -start synthroid IV, convert to PO when possible  NEUROLOGIC  A:  Sedation to off 7/15 P:      BEST PRACTICE / DISPOSITION Level of Care:  ICU Primary Service:  Vascular Consultants:  PCCM Code Status:  full Diet:  Npo >> attempt sips/chips once extubated DVT Px:  Hep gtt GI Px:  ppi Skin Integrity:  normal Social / Family:  None at bedside  CC time 30 minutes  Baltazar Apo, MD, PhD 12/06/2011, 8:52 AM [facility] [phone] or if no answer [phone]

## 2011-12-06 NOTE — Progress Notes (Signed)
Increased PS to 10 til CCM rounds.

## 2011-12-06 NOTE — Consult Note (Signed)
Name: [name] MRN: [id number] DOB: [birth year]    LOS: 1  Referring Provider:  Kellie Simmering Reason for Referral:  VDRF post vascular surgery  PULMONARY / CRITICAL CARE MEDICINE  HPI:  This is an 78 y/o male with HTN, GERD, and AAA who underwent resection and grafting of an infrarenal AAA with insertion of a common iliac graft and exploration of R popliteal artery with embolectomy of posterior tibial and anterior tibial arteries with intraoperative arteriogram of R leg on 12/05/11.  6 hours after the case he lost pulses in the R foot so he was taken back to the OR for thrombectomy of the anterior tibial, posterior tibial, and peroneal arteries.  After that procedure PCCM was consulted for vent management.  Past Medical History  Diagnosis Date  . AAA (abdominal aortic aneurysm)   . GERD (gastroesophageal reflux disease)   . Arthritis   . Hypertension   . Chronic kidney disease     Renal insufficiency  . Coronary artery disease   . Hemorrhoid    Past Surgical History  Procedure Date  . Eye surgery ~ 1 year    cataract   Prior to Admission medications   Medication Sig Start Date End Date Taking? Authorizing Provider  aspirin 81 MG tablet Take 81 mg by mouth daily.   Yes Historical Provider, MD  fish oil-omega-3 fatty acids 1000 MG capsule Take 2 g by mouth daily.   Yes Historical Provider, MD  levothyroxine (SYNTHROID, LEVOTHROID) 150 MCG tablet Take 150 mcg by mouth daily.   Yes Historical Provider, MD  lisinopril-hydrochlorothiazide (PRINZIDE,ZESTORETIC) 10-12.5 MG per tablet Take 1 tablet by mouth daily.   Yes Historical Provider, MD  Multiple Vitamins-Minerals (MULTIVITAMIN WITH MINERALS) tablet Take 1 tablet by mouth daily.   Yes Historical Provider, MD  ranitidine (ZANTAC) 150 MG tablet Take 150 mg by mouth 2 (two) times daily.   Yes Historical Provider, MD   Allergies No Known Allergies  Family History Family History  Problem Relation Age of Onset  . Coronary artery disease  Mother   . Kidney disease Mother   . Heart attack Mother   . Stroke Father   . Hypertension Father    Social History  reports that he quit smoking about 38 years ago. His smoking use included Cigarettes. He smoked 1 pack per day. He has never used smokeless tobacco. He reports that he does not drink alcohol or use illicit drugs.  Review Of Systems:  Cannot obtain due to intubation  Brief patient description:  78 y/o male s/p AAA repair and R ant tibial/post tibial/peroneal arterial thrombectomy.  PCCM consulted for vent management.  Events Since Admission: 12/05/11  #1 thrombectomy of anterior tibial, posterior tibial, and peroneal arteries  #2 vein patch angioplasty of tibioperoneal trunk right leg  #3 intraoperative arteriogram right leg x2  12/05/11 #1 resection and grafting of infrarenal abdominal aortic aneurysm with insertion of aorto by common iliac graft using an 18 x 9 mm Hemashield Dacron graft  #2 exploration of right popliteal artery with embolectomy of posterior tibial and anterior tibial arteries with intraoperative arteriogram right leg   Current Status: critically ill on vent  Vital Signs: Temp:  [96.8 F (36 C)-98.4 F (36.9 C)] 97.9 F (36.6 C) (08/15 0145) Pulse Rate:  [56-91] 63  (08/15 0145) Resp:  [12-23] 15  (08/15 0145) BP: (89-168)/(47-82) 122/47 mmHg (08/15 0145) SpO2:  [93 %-99 %] 98 % (08/15 0145) Arterial Line BP: (103-201)/(37-78) 151/61 mmHg (08/15 0145) FiO2 (%):  [  40 %] 40 % (08/15 0100) Weight:  [89.812 kg (198 lb)] 89.812 kg (198 lb) (08/14 1718)  Physical Examination:   Active Problems:  Postoperative respiratory failure  AAA (abdominal aortic aneurysm)  Ischemic leg  CKD  GERD (gastroesophageal reflux disease)  CAD (coronary artery disease)  HTN (hypertension)   ASSESSMENT AND PLAN  PULMONARY  Lab 12/06/11 0124 12/05/11 1510 12/05/11 1331 12/05/11 1131  PHART 7.292* 7.342* 7.290* 7.179*  PCO2ART 42.2 40.4 48.9* 56.5*    PO2ART 84.5 80.3 308.0* 158.0*  HCO3 20.0 21.3 23.5 21.0  O2SAT 98.2 96.3 100.0 99.0   Ventilator Settings: Vent Mode:  [-] PRVC FiO2 (%):  [40 %] 40 % Set Rate:  [15 bmp] 15 bmp Vt Set:  RW:212346 mL] 620 mL PEEP:  [5 cmH20] 5 cmH20 Plateau Pressure:  [17 cmH20] 17 cmH20 CXR:  Bilat airspace disease and effusions, likely pulm edema ETT:  12/05/11  A:  Hypoxemic respiratory failure, post operative respiratory failure; Pulmonary edema related to volume given during multiple surgeries 8/14 P:   -repeat CXR now -ABG with metabolic acidosis, check lactate -continue full vent support -will likely need diuresis as renal function allows prior to extubation  CARDIOVASCULAR  Lab 12/05/11 1920  TROPONINI 0.72*  LATICACIDVEN --  PROBNP --   ECG:  NSR, no ST wave changes Lines: R SGC 8/14 >>  L radial a line 8/14 >>  A:  AAA s/p repair Ischaemic R foot, s/p repair Known CAD, mildly elevated Troponin HTN, BP stable  P:  -per vascular and cardiology -hep gtt -hold home lisinopril and HCTZ until we see renal function 8/15  RENAL  Lab 12/05/11 2135 12/05/11 1519 12/05/11 1331 12/05/11 1131  NA 143 143 142 141  K 5.1 4.7 -- --  CL -- 110 -- --  CO2 -- 22 -- --  BUN -- 23 -- --  CREATININE -- 1.69* -- --  CALCIUM -- 7.8* -- --  MG -- 1.2* -- --  PHOS -- -- -- --   Intake/Output      08/14 0701 - 08/15 0700   I.V. (mL/kg) 8422.2 (93.8)   Blood 810   NG/GT 60   IV Piggyback 1360   Total Intake(mL/kg) 10652.2 (118.6)   Urine (mL/kg/hr) 1485 (0.7)   Blood 900   Total Output 2385   Net +8267.2        Foley:  8/14  A:  CKD, s/p multiple procedures aortograms so high risk for AKI Metabolic acidosis, uncertain etiology but favor worsening renal function or lactic acidosis P:   -repeat BMET now -lactic acid now  GASTROINTESTINAL No results found for this basename: AST:5,ALT:5,ALKPHOS:5,BILITOT:5,PROT:5,ALBUMIN:5 in the last 168 hours  A:  No acute issues P:    -OG  HEMATOLOGIC  Lab 12/05/11 2135 12/05/11 1519 12/05/11 1331 12/05/11 1131  HGB 10.2* 10.9* 9.9* 9.5*  HCT 30.0* 32.2* 29.0* 28.0*  PLT -- 124* -- --  INR -- 1.21 -- --  APTT -- 36 -- --   A:  Anemia, Hgb stable P:  -monitor H/H  INFECTIOUS  Lab 12/05/11 1519  WBC 13.3*  PROCALCITON --   Cultures:  Antibiotics:   A:  No acute issues P:   -monitor fever curve  ENDOCRINE No results found for this basename: GLUCAP:5 in the last 168 hours A:  Hypothyroid   P:   -restart Synthroid when able to take PO  NEUROLOGIC  A:  Sedation needs P:   -fent/versed titrated to RASS -1  BEST PRACTICE / DISPOSITION Level of Care:  ICU Primary Service:  Vascular Consultants:  PCCM Code Status:  full Diet:  npo DVT Px:  Hep gtt GI Px:  ppi Skin Integrity:  normal Social / Family:  Update 8/15 AM  CC time 40 minutes  Qianna Clagett, M.D. Pulmonary and [place] Pager: [phone]  12/06/2011, 2:45 AM

## 2011-12-06 NOTE — Consult Note (Signed)
Reason for Consult: + troponin and concern for NSTEMI Referring Physician: Dr. Estelle June T Newbern is an 78 y.o. male.  HPI: Mr. Sitzer is a 78 yo man with PMH of HTN, CKD, GERD and AAA now s/p infrarenal AAA repair with ischemic right foot that has required repeat thrombectomy of right lower leg arteries now with doppler-able pulses. Cardiology consulted for concern of elevated troponin and mildly elevated CKMB with some subtle EKG changes peri-operatively. Recent Lexiscan normal and per history and chart review no known coronary artery disease or significant limitations. He does have a remote tobacco history. The EKG from earlier today was reviewed and demonstrated nonspecific signs of ischemia with ST depressions anterolaterally. Currently, Mr. Birckhead is intubated/mildly sedated with fairly normal hemodynamics not requiring any vasopressor or inotropic support with PA-Catheter demonstrating 50s/20s and CO 4.7. The RN mentioned a brief run of NSVT ~ 4-5 beats early today corresponding to magnesium of 1.2.    Past Medical History  Diagnosis Date  . AAA (abdominal aortic aneurysm)   . GERD (gastroesophageal reflux disease)   . Arthritis   . Hypertension   . Chronic kidney disease     Renal insufficiency  . Coronary artery disease   . Hemorrhoid     Past Surgical History  Procedure Date  . Eye surgery ~ 1 year    cataract    Family History  Problem Relation Age of Onset  . Coronary artery disease Mother   . Kidney disease Mother   . Heart attack Mother   . Stroke Father   . Hypertension Father     Social History:  reports that he quit smoking about 38 years ago. His smoking use included Cigarettes. He smoked 1 pack per day. He has never used smokeless tobacco. He reports that he does not drink alcohol or use illicit drugs.  Allergies: No Known Allergies  Medications: I have reviewed the patient's current medications.  Results for orders placed during the hospital encounter of  12/05/11 (from the past 48 hour(s))  POCT I-STAT 7, (LYTES, BLD GAS, ICA,H+H)     Status: Abnormal   Collection Time   12/05/11 11:31 AM      Component Value Range Comment   pH, Arterial 7.179 (*) 7.350 - 7.450    pCO2 arterial 56.5 (*) 35.0 - 45.0 mmHg    pO2, Arterial 158.0 (*) 80.0 - 100.0 mmHg    Bicarbonate 21.0  20.0 - 24.0 mEq/L    TCO2 23  0 - 100 mmol/L    O2 Saturation 99.0      Acid-base deficit 7.0 (*) 0.0 - 2.0 mmol/L    Sodium 141  135 - 145 mEq/L    Potassium 4.5  3.5 - 5.1 mEq/L    Calcium, Ion 1.23  1.13 - 1.30 mmol/L    HCT 28.0 (*) 39.0 - 52.0 %    Hemoglobin 9.5 (*) 13.0 - 17.0 g/dL    Sample type ARTERIAL      Comment NOTIFIED PHYSICIAN     POCT I-STAT 7, (LYTES, BLD GAS, ICA,H+H)     Status: Abnormal   Collection Time   12/05/11  1:31 PM      Component Value Range Comment   pH, Arterial 7.290 (*) 7.350 - 7.450    pCO2 arterial 48.9 (*) 35.0 - 45.0 mmHg    pO2, Arterial 308.0 (*) 80.0 - 100.0 mmHg    Bicarbonate 23.5  20.0 - 24.0 mEq/L    TCO2 25  0 -  100 mmol/L    O2 Saturation 100.0      Acid-base deficit 3.0 (*) 0.0 - 2.0 mmol/L    Sodium 142  135 - 145 mEq/L    Potassium 5.3 (*) 3.5 - 5.1 mEq/L    Calcium, Ion 1.17  1.13 - 1.30 mmol/L    HCT 29.0 (*) 39.0 - 52.0 %    Hemoglobin 9.9 (*) 13.0 - 17.0 g/dL    Sample type ARTERIAL     BLOOD GAS, ARTERIAL     Status: Abnormal   Collection Time   12/05/11  3:10 PM      Component Value Range Comment   O2 Content 6.0      Delivery systems SIMPLE MASK      pH, Arterial 7.342 (*) 7.350 - 7.450    pCO2 arterial 40.4  35.0 - 45.0 mmHg    pO2, Arterial 80.3  80.0 - 100.0 mmHg    Bicarbonate 21.3  20.0 - 24.0 mEq/L    TCO2 22.5  0 - 100 mmol/L    Acid-base deficit 3.5 (*) 0.0 - 2.0 mmol/L    O2 Saturation 96.3      Patient temperature 98.6      Collection site A-LINE      Drawn by [phone]      Sample type ARTERIAL DRAW     BASIC METABOLIC PANEL     Status: Abnormal   Collection Time   12/05/11  3:19 PM       Component Value Range Comment   Sodium 143  135 - 145 mEq/L    Potassium 4.7  3.5 - 5.1 mEq/L    Chloride 110  96 - 112 mEq/L    CO2 22  19 - 32 mEq/L    Glucose, Bld 148 (*) 70 - 99 mg/dL    BUN 23  6 - 23 mg/dL    Creatinine, Ser 1.69 (*) 0.50 - 1.35 mg/dL    Calcium 7.8 (*) 8.4 - 10.5 mg/dL    GFR calc non Af Amer 36 (*) >90 mL/min    GFR calc Af Amer 42 (*) >90 mL/min   APTT     Status: Normal   Collection Time   12/05/11  3:19 PM      Component Value Range Comment   aPTT 36  24 - 37 seconds   CBC     Status: Abnormal   Collection Time   12/05/11  3:19 PM      Component Value Range Comment   WBC 13.3 (*) 4.0 - 10.5 K/uL    RBC 3.37 (*) 4.22 - 5.81 MIL/uL    Hemoglobin 10.9 (*) 13.0 - 17.0 g/dL    HCT 32.2 (*) 39.0 - 52.0 %    MCV 95.5  78.0 - 100.0 fL    MCH 32.3  26.0 - 34.0 pg    MCHC 33.9  30.0 - 36.0 g/dL    RDW 13.7  11.5 - 15.5 %    Platelets 124 (*) 150 - 400 K/uL   PROTIME-INR     Status: Abnormal   Collection Time   12/05/11  3:19 PM      Component Value Range Comment   Prothrombin Time 15.6 (*) 11.6 - 15.2 seconds    INR 1.21  0.00 - 1.49   MAGNESIUM     Status: Abnormal   Collection Time   12/05/11  3:19 PM      Component Value Range Comment   Magnesium 1.2 (*) 1.5 -  2.5 mg/dL   CARDIAC PANEL(CRET KIN+CKTOT+MB+TROPI)     Status: Abnormal   Collection Time   12/05/11  7:20 PM      Component Value Range Comment   Total CK 158  7 - 232 U/L    CK, MB 6.0 (*) 0.3 - 4.0 ng/mL    Troponin I 0.72 (*) <0.30 ng/mL    Relative Index 3.8 (*) 0.0 - 2.5   POCT I-STAT 4, (NA,K, GLUC, HGB,HCT)     Status: Abnormal   Collection Time   12/05/11  9:35 PM      Component Value Range Comment   Sodium 143  135 - 145 mEq/L    Potassium 5.1  3.5 - 5.1 mEq/L    Glucose, Bld 152 (*) 70 - 99 mg/dL    HCT 30.0 (*) 39.0 - 52.0 %    Hemoglobin 10.2 (*) 13.0 - 17.0 g/dL     Dg Ang/ext/uni/or Right  12/05/2011  *RADIOLOGY REPORT*  Clinical Data: Right leg embolectomy.  RIGHT  ANG/EXT/UNI/ OR  Comparison:  None.  Findings: Intraoperative image demonstrates opacification of the native popliteal artery which appears normally patent from the level of the knee joint into the proximal calf.  The posterior tibial artery is patent to the ankle.  The anterior tibial artery has a filling defect at roughly the juncture of the mid and distal calf.  IMPRESSION: Distal anterior tibial artery filling defect.  Original Report Authenticated By: Azzie Roup, M.D.   Dg Chest Portable 1 View  12/05/2011  *RADIOLOGY REPORT*  Clinical Data: Swan-Ganz catheter placement.  Abdominal aortic aneurysm repair.  PORTABLE CHEST - 1 VIEW 12/05/2011 1540 hours:  Comparison: Two-view chest x-ray 11/28/2011.  Findings: Swan-Ganz catheter tip in the right main pulmonary artery.  No evidence of pneumothorax mediastinal hematoma. Nasogastric tube courses below the diaphragm into the stomach. Cardiac silhouette enlarged.  Moderate diffuse interstitial and airspace pulmonary edema.  Linear atelectasis at the right lung base.  Bilateral pleural effusions, right greater than left.  IMPRESSION: Support apparatus satisfactory.  No pneumothorax.  Moderate CHF and/or fluid overload.  Bilateral pleural effusions, right greater than left.  Linear atelectasis at the right lung base.  Original Report Authenticated By: Deniece Portela, M.D.    Review of Systems  Unable to perform ROS: intubated   Blood pressure 150/47, pulse 79, temperature 98.4 F (36.9 C), temperature source Core (Comment), resp. rate 15, height 6' (1.829 m), weight 89.812 kg (198 lb), SpO2 94.00%. Physical Exam  Nursing note and vitals reviewed. Constitutional: He appears well-developed and well-nourished. No distress.       Intubated and mildly sedated; appears comfortable  HENT:  Head: Normocephalic and atraumatic.  Nose: Nose normal.  Mouth/Throat: Oropharynx is clear and moist. No oropharyngeal exudate.       ETT in place  Eyes:  Conjunctivae and EOM are normal. Pupils are equal, round, and reactive to light. No scleral icterus.  Neck: Normal range of motion. Neck supple. No JVD present. No tracheal deviation present. No thyromegaly present.  Cardiovascular: Normal rate, regular rhythm, normal heart sounds and intact distal pulses.  Exam reveals no gallop.   No murmur heard. Respiratory: Effort normal and breath sounds normal. No respiratory distress. He has no wheezes. He has no rales.  GI: Soft. He exhibits no distension. There is no tenderness. There is no rebound.       Hypoactive bowel sounds; wound c/d/i from AAA repair  Musculoskeletal: Normal range of motion. He exhibits  no edema and no tenderness.       Right foot cool - dopplerable PT; left foot warm, pulses 2+  Neurological:       Intubated/sedated  Skin: Skin is warm and dry. No rash noted. He is not diaphoretic. No erythema.   EKG reviewed from early 8/14 - subtle ST depressions V3-V5, I, aVL EKG 8/15 12:50 - ST flattening V3-V5 Labs reviewed; K 5.1, na 143, bun/cr 22/1.7, inr 1.21, troponin 0.72/ck 158/ckmb 6.0, wbc 13.3, h/h 10.2/30, plt 124 Chest x-ray: some mild edema 11/19/11 Myocardial Lexiscan with EF 65%, no ischemia  Problem List +Troponin, Low + CKMB S/P Infrarenal AAA repair S/P right ischemic foot vascular surgery x 2 Anemia Leukocytosis Elevated creatinine/Chronic Kidney Disease Assessment/Plan: 78 yo man with history of HTN, CKD, recent normal lexiscan now s/p infrarenal AAA repair and right lower leg thrombectomy x2 this evening with + troponin and stable hemodynamics. The differential diagnosis for + troponin and mildly positive CKMB is largely confined to myocardial damage/ischemia. In light of a fairly high risk vascular procedure, the troponin of [id number] could certainly be related to demand ischemia and more consistent with a type II NSTEMI, which with an isolated troponin and CKMB I favor given recent normal Lexiscan. However, Carlton Adam  is ~ 85-90% sensitive/specific so false negative studies are certainly possible. I'm encouraged by current hemodynamics and the repeat EKG demonstrates less ischemia (only ST flattening and no overt elevations or depressions). Will plan to trend cardiac enzymes, continue heparin and would initiate aspirin daily if not contraindicated from surgical standpoint - 300 mg rectally and then 81 mg daily. If cardiac markers trend up with a classic rise and fall pattern of a typical NSTEMI then we can address further medical management vs. Cardiac catheterization with a risk/benefit profile in setting of chronic kidney disease and recent surgery.  - trend troponins/CKMB - serial EKG for any changes/concerns - agree with beta blocker - standing oral dose of metoprolol bid when mr. Fendrick is extubated and cleared - maintain K > 4, Mg > 2 as you are - would add on aspirin 300 mg rectally and 81 mg daily as able s/p AAA and thrombectomies - [facility] will follow along - call with any questions  Huckleberry Martinson 12/06/2011, 12:48 AM

## 2011-12-06 NOTE — Progress Notes (Signed)
Patient ID: [name], male   DOB: May 16, 1928, 78 y.o.   MRN: [id number] Vascular Surgery Progress Note  Subjective: One day post resection and grafting of abdominal aortic aneurysm with thrombectomy of tibial vessels on 2 occasions with vein patch angioplasty tibioperoneal trunk. Patient awake and alert on the ventilator this a.m. Had stable night. Denies pain in right foot. States sensation is much improved in right foot. Patient maintained on heparin drip at 800 units per hour during the night. Cardiology consult obtained last p.m. D2 elevated CPK MB and troponin  Objective:  Filed Vitals:   12/06/11 0715  BP: 127/54  Pulse: 80  Temp: 99.1 F (37.3 C)  Resp: 19    Gen. alert on the ventilator Lungs no rhonchi or wheezing Excellent respiratory effort Right lower extremity with 3+ femoral and 3+ posterior tibial pulse palpable. Right foot less ischemic in appearance. Doppler flow in PT peroneal and PT this a.m. Left foot with 3+ dorsalis pedis pulse palpable.   Labs:  Lab 12/06/11 0230 12/05/11 1519  CREATININE 1.88* 1.69*    Lab 12/06/11 0230 12/05/11 2135 12/05/11 1519  NA 141 143 143  K 5.6* 5.1 4.7  CL 110 -- 110  CO2 23 -- 22  BUN 23 -- 23  CREATININE 1.88* -- 1.69*  LABGLOM -- -- --  GLUCOSE 153* -- --  CALCIUM 7.6* -- 7.8*    Lab 12/06/11 0230 12/05/11 2135 12/05/11 1519  WBC 13.5* -- 13.3*  HGB 11.3* 10.2* 10.9*  HCT 33.7* 30.0* 32.2*  PLT 109* -- 124*    Lab 12/05/11 1519  INR 1.21    I/O last 3 completed shifts: In: 11473.2 [I.V.:9133.2; Blood:810; NG/GT:120; IV Piggyback:1410] Out: 2720 [Urine:1640; Emesis/NG output:100; Drains:80; Blood:900]  Imaging: Dg Ang/ext/uni/or Right  12/05/2011  *RADIOLOGY REPORT*  Clinical Data: Right leg embolectomy.  RIGHT ANG/EXT/UNI/ OR  Comparison:  None.  Findings: Intraoperative image demonstrates opacification of the native popliteal artery which appears normally patent from the level of the knee joint into the  proximal calf.  The posterior tibial artery is patent to the ankle.  The anterior tibial artery has a filling defect at roughly the juncture of the mid and distal calf.  IMPRESSION: Distal anterior tibial artery filling defect.  Original Report Authenticated By: Azzie Roup, M.D.   Dg Chest Port 1 View  12/06/2011  *RADIOLOGY REPORT*  Clinical Data: Evaluate endotracheal tube position, cold left leg  PORTABLE CHEST - 1 VIEW  Comparison: Portable chest x-ray of 12/04/2028  Findings: Pain aeration of the lungs has improved.  Bibasilar opacities remain most consistent with atelectasis or possibly effusions.  Pneumonia in the lung bases cannot be excluded.  The tip of the endotracheal tube is approximally 2.8 cm above the carina.  Swan-Ganz catheter is noted with the tip in the right pulmonary artery.  IMPRESSION:  1.  Slightly better aeration with persistent bibasilar opacities. 2.  Tip of endotracheal tube 2.8 cm above the carina.  Original Report Authenticated By: Joretta Bachelor, M.D.   Dg Chest Portable 1 View  12/05/2011  *RADIOLOGY REPORT*  Clinical Data: Swan-Ganz catheter placement.  Abdominal aortic aneurysm repair.  PORTABLE CHEST - 1 VIEW 12/05/2011 1540 hours:  Comparison: Two-view chest x-ray 11/28/2011.  Findings: Swan-Ganz catheter tip in the right main pulmonary artery.  No evidence of pneumothorax mediastinal hematoma. Nasogastric tube courses below the diaphragm into the stomach. Cardiac silhouette enlarged.  Moderate diffuse interstitial and airspace pulmonary edema.  Linear atelectasis at the  right lung base.  Bilateral pleural effusions, right greater than left.  IMPRESSION: Support apparatus satisfactory.  No pneumothorax.  Moderate CHF and/or fluid overload.  Bilateral pleural effusions, right greater than left.  Linear atelectasis at the right lung base.  Original Report Authenticated By: Deniece Portela, M.D.    Assessment/Plan:  POD #1  LOS: 1 day  s/p Procedure(s): RMG AAA  plus embolectomy tibial vessels x2 with patch angioplasty tibioperoneal trunk Elevated cardiac enzymes-followed by cardiology-no cardiac history with normal lexascan Heparin 800 units per hour with upper level 0.41-hematocrit stable at 33  Plan #1 increase heparin to thousand units per hour #2 plan extubation this a.m. per ccm #3 continue close observation in surgical intensive care unit on heparin drip #4 family understands that no further surgical interventions indicated #5 check Keppra level and labs later this a.m.   Tinnie Gens, MD 12/06/2011 7:43 AM

## 2011-12-06 NOTE — Evaluation (Signed)
Occupational Therapy Evaluation Patient Details Name: [name] MRN: [id number] DOB: June 19, 1928 Today's Date: 12/06/2011 Time: [id number] OT Time Calculation (min): 30 min  OT Assessment / Plan / Recommendation Clinical Impression  This 78 yo s/p AAA repair presents to acute OTwith problems below. Will benefit from acute OT without need for follow-up.    OT Assessment  Patient needs continued OT Services    Follow Up Recommendations  No OT follow up    Barriers to Discharge None    Equipment Recommendations  3 in 1 bedside comode    Recommendations for Other Services    Frequency  Min 2X/week    Precautions / Restrictions Precautions Precautions: Fall Precaution Comments: abdominal wound, RLE wounds, swan ganz, A-line Restrictions Weight Bearing Restrictions: No   Pertinent Vitals/Pain 8/10 RLE    ADL  Eating/Feeding: Simulated;Set up Where Assessed - Eating/Feeding: Bed level Grooming: Simulated;Set up Where Assessed - Grooming: Unsupported sitting Upper Body Bathing: Simulated;Minimal assistance Where Assessed - Upper Body Bathing: Unsupported sitting Lower Body Bathing: Simulated;Maximal assistance Where Assessed - Lower Body Bathing: Supported sit to stand Upper Body Dressing: Simulated;Maximal assistance Where Assessed - Upper Body Dressing: Unsupported sitting Lower Body Dressing: Simulated;+1 Total assistance Where Assessed - Lower Body Dressing: Supported sit to stand Toilet Transfer: Simulated;+2 Total assistance Toilet Transfer: Patient Percentage: 60% Armed forces technical officer Method: Sit to Loss adjuster, chartered:  (Bed (raised) side step to recliner on his left) Toileting - Clothing Manipulation and Hygiene: Simulated;+1 Total assistance Where Assessed - Toileting Clothing Manipulation and Hygiene: Standing Transfers/Ambulation Related to ADLs: Total A +2 pt=60%    OT Diagnosis: Generalized weakness;Acute pain  OT Problem List: Decreased  strength;Decreased range of motion;Decreased activity tolerance;Impaired balance (sitting and/or standing);Pain;Decreased knowledge of use of DME or AE OT Treatment Interventions: Self-care/ADL training;DME and/or AE instruction;Balance training;Patient/family education   OT Goals Acute Rehab OT Goals OT Goal Formulation: With patient Time For Goal Achievement: 12/20/11 Potential to Achieve Goals: Good ADL Goals Pt Will Perform Grooming: with set-up;with supervision;Unsupported;Standing at sink (2 tasks) ADL Goal: Grooming - Progress: Goal set today Pt Will Perform Lower Body Bathing: with min assist;Unsupported;Sit to stand from chair;Sit to stand from bed ADL Goal: Lower Body Bathing - Progress: Goal set today Pt Will Perform Lower Body Dressing: with min assist;Unsupported;Sit to stand from chair;Sit to stand from bed ADL Goal: Lower Body Dressing - Progress: Goal set today Pt Will Transfer to Toilet: with supervision;Ambulation;with DME;3-in-1 ADL Goal: Toilet Transfer - Progress: Goal set today Pt Will Perform Toileting - Clothing Manipulation: Independently;Standing ADL Goal: Toileting - Clothing Manipulation - Progress: Goal set today Pt Will Perform Toileting - Hygiene: Independently;Sit to stand from 3-in-1/toilet ADL Goal: Toileting - Hygiene - Progress: Goal set today Pt Will Perform Tub/Shower Transfer: with supervision;Ambulation;with DME (3-n-1) ADL Goal: Tub/Shower Transfer - Progress: Goal set today Miscellaneous OT Goals Miscellaneous OT Goal #1: Pt will be Independent with in/OOB for BADLs. 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 Prior Function Level of Independence: Independent Able to Take Stairs?: Yes Driving: Yes Vocation: Part time employment Communication Communication: No difficulties Dominant [place]: Right      Cognition  Overall Cognitive Status: Appears within functional limits for tasks assessed/performed Arousal/Alertness: Awake/alert Orientation Level: Appears intact for tasks assessed Behavior During Session: [facility] for tasks performed    Extremity/Trunk Assessment Right Upper Extremity Assessment RUE ROM/Strength/Tone: Within functional levels Left Upper Extremity Assessment LUE ROM/Strength/Tone: Within functional levels Right Lower Extremity Assessment RLE ROM/Strength/Tone: Deficits RLE ROM/Strength/Tone Deficits: grossly 4/5 Left Lower Extremity Assessment LLE ROM/Strength/Tone: Deficits LLE ROM/Strength/Tone Deficits: grossly 4/5   Mobility Bed Mobility Bed Mobility: Supine to Sit;Sitting - Scoot to Edge of Bed Supine to Sit: 4: Min assist;HOB elevated Sitting - Scoot to [employer] of Bed: 4: Min assist Details for Bed Mobility Assistance: assist to bring trunk up and to manage lines Transfers Sit to Stand: 1: +2 Total assist;With upper extremity assist;From bed;From elevated surface Sit to Stand: Patient Percentage: 60% Stand to Sit: 1: +2 Total assist;To chair/3-in-1;With upper extremity assist;With armrests Stand to Sit: Patient Percentage: 70% Details for Transfer Assistance: Pt needed assist to raise hips up. Used bil HHA to perform pivot.   Exercise    Balance    End of Session OT - End of  Session Activity Tolerance: Patient limited by pain Patient left: in chair;with family/visitor present (wife) Nurse Communication: Mobility status       Almon Register [id number] 12/06/2011, 2:35 PM

## 2011-12-06 NOTE — Progress Notes (Signed)
PT Cancellation Note  Treatment cancelled today due to medical issues with patient which prohibited therapy (pt still intubated).  Earley Grobe 12/06/2011, 8:45 AM

## 2011-12-06 NOTE — Transfer of Care (Signed)
Immediate Anesthesia Transfer of Care Note  Patient: [name]  Procedure(s) Performed: Procedure(s) (LRB): EMBOLECTOMY (Right) ANGIOGRAM EXTREMITY RIGHT (Right)  Patient Location: SICU  Anesthesia Type: General  Level of Consciousness: Patient remains intubated per anesthesia plan  Airway & Oxygen Therapy: Patient remains intubated per anesthesia plan and Patient placed on Ventilator (see vital sign flow sheet for setting)  Post-op Assessment: Report given to ICU RN and Post -op Vital signs reviewed and stable  Post vital signs: Reviewed and stable  Complications: No apparent anesthesia complications

## 2011-12-06 NOTE — Anesthesia Postprocedure Evaluation (Signed)
  Anesthesia Post-op Note  Patient: [name]  Procedure(s) Performed: Procedure(s) (LRB): EMBOLECTOMY (Right) ANGIOGRAM EXTREMITY RIGHT (Right)  Patient Location: SICU  Anesthesia Type: General  Level of Consciousness: awake and patient cooperative  Airway and Oxygen Therapy: Patient placed on Ventilator (see vital sign flow sheet for setting)  Post-op Pain: none  Post-op Assessment: Post-op Vital signs reviewed, Patient's Cardiovascular Status Stable and Respiratory Function Stable  Post-op Vital Signs: Reviewed and stable  Complications: No apparent anesthesia complications

## 2011-12-06 NOTE — Evaluation (Signed)
Physical Therapy Evaluation Patient Details Name: [name] MRN: [id number] DOB: 08-19-1928 Today's Date: 12/06/2011 Time: [id number] PT Time Calculation (min): 28 min  PT Assessment / Plan / Recommendation Clinical Impression  Pt adm for AAA repair.  Pt very active and working part-time prior to surgery.  Expect pt will make good progress and be able to return home with wife.    PT Assessment  Patient needs continued PT services    Follow Up Recommendations  Home health PT;Supervision/Assistance - 24 hour (initially)    Barriers to Discharge        Equipment Recommendations   (to be determined)    Recommendations for Other Services     Frequency Min 3X/week    Precautions / Restrictions Precautions Precautions: Fall   Pertinent Vitals/Pain VSS      Mobility  Bed Mobility Bed Mobility: Supine to Sit;Sitting - Scoot to Edge of Bed Supine to Sit: 4: Min assist;HOB elevated Sitting - Scoot to [employer] of Bed: 4: Min assist Details for Bed Mobility Assistance: assist to bring trunk up and to manage lines Transfers Transfers: Sit to Stand;Stand to Sit;Stand Pivot Transfers Sit to Stand: 1: +2 Total assist;With upper extremity assist;From bed;From elevated surface Sit to Stand: Patient Percentage: 60% Stand to Sit: 1: +2 Total assist;To chair/3-in-1;With upper extremity assist;With armrests Stand to Sit: Patient Percentage: 70% Stand Pivot Transfers: 1: +2 Total assist Stand Pivot Transfers: Patient Percentage: 60% Details for Transfer Assistance: Pt needed assist to raise hips up. Used bil HHA to perform pivot.    Exercises     PT Diagnosis: Difficulty walking;Generalized weakness;Acute pain  PT Problem List: Decreased strength;Decreased activity tolerance;Decreased balance;Decreased mobility;Decreased knowledge of use of DME PT Treatment Interventions: DME instruction;Gait training;Stair training;Functional mobility training;Therapeutic activities;Therapeutic  exercise;Balance training;Patient/family education   PT Goals Acute Rehab PT Goals PT Goal Formulation: With patient Time For Goal Achievement: 12/13/11 Potential to Achieve Goals: Good Pt will go Supine/Side to Sit: with modified independence PT Goal: Supine/Side to Sit - Progress: Goal set today Pt will go Sit to Supine/Side: with modified independence PT Goal: Sit to Supine/Side - Progress: Goal set today Pt will go Sit to Stand: with supervision PT Goal: Sit to Stand - Progress: Goal set today Pt will go Stand to Sit: with supervision PT Goal: Stand to Sit - Progress: Goal set today Pt will Ambulate: >150 feet;with supervision;with least restrictive assistive device PT Goal: Ambulate - Progress: Goal set today Pt will Go Up / Down Stairs: 1-2 stairs;with min assist;with least restrictive assistive device PT Goal: Up/Down Stairs - Progress: Goal set today  Visit Information  Last PT Received On: 12/06/11 Assistance Needed: +2 PT/OT Co-Evaluation/Treatment: Yes    Subjective Data  Subjective: 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 Patient Stated Goal: Return home   Prior [place] Lives With: Spouse Available Help at Discharge: Family Type of Home: House Home Access: Stairs to enter Technical brewer of Steps: 2 Entrance Stairs-Rails: None Home Layout: One level Bathroom Shower/Tub: Walk-in shower;Door [employer] Toilet: Standard Additional Comments: works for Education administrator. Prior Function Level of Independence: Independent Able to Take Stairs?: Yes Driving: Yes Vocation: Part time employment Communication Communication: No difficulties    Cognition  Overall Cognitive Status: Appears within functional limits for tasks assessed/performed Arousal/Alertness: Awake/alert Orientation Level: Appears intact for tasks assessed Behavior During Session: [facility] for tasks performed    Extremity/Trunk Assessment Right Lower Extremity  Assessment RLE ROM/Strength/Tone: Deficits RLE ROM/Strength/Tone Deficits:  grossly 4/5 Left Lower Extremity Assessment LLE ROM/Strength/Tone: Deficits LLE ROM/Strength/Tone Deficits: grossly 4/5   Balance    End of Session PT - End of Session Activity Tolerance: Patient tolerated treatment well Patient left: in chair;with call bell/phone within reach;with family/visitor present Nurse Communication: Mobility status  GP     [name] 12/06/2011, 2:14 PM  [facility] PT [phone]

## 2011-12-06 NOTE — Care Management Note (Unsigned)
    Page 1 of 1   12/10/2011     3:23:21 PM   CARE MANAGEMENT NOTE 12/10/2011  Patient:  [name], [name]   Account Number:  [id number]  Date Initiated:  12/06/2011  Documentation initiated by:  Kaydon Creedon  Subjective/Objective Assessment:   PT S/P AAA REPAIR AND EMBOLIZATION OF RLE VESSELS ON 12/05/11.  PTA, PT INDEPENDENT, LIVES WITH SPOUSE.     Action/Plan:   MET WITH PT TO DISCUSSS DC PLANS.  PT STATES WIFE WILL PROVIDE CARE AT DISCHARGE.  WILL FOLLOW FOR HOME NEEDS AS PT PROGRESSES.   Anticipated DC Date:  12/11/2011   Anticipated DC Plan:  [place]  CM consult      Choice offered to / List presented to:             Status of service:  In process, will continue to follow Medicare Important Message given?   (If response is "NO", the following Medicare IM given date fields will be blank) Date Medicare IM given:   Date Additional Medicare IM given:    Discharge Disposition:    Per UR Regulation:  Reviewed for med. necessity/level of care/duration of stay  If discussed at [place] of Stay Meetings, dates discussed:    Comments:  12/10/11 [place]; PT MAKING SLOW PROGRESS.  WILL FOLLOW--IF PT CONT TO HAVE CONFUSION, MAY HAVE TO RETHINK HOME WITH HH.

## 2011-12-06 NOTE — Progress Notes (Signed)
CRITICAL VALUE ALERT  Critical value received:  Troponin 0.72  Date of notification:  12/05/11  Time of notification:  2050  Critical value read back:yes  Nurse who received alert:  Cathe Mons  MD notified (1st page):  Dr Kellie Simmering at bedside  Time of first page:  2050  MD notified (2nd page):  Time of second page:  Responding MD:  Dr Kellie Simmering at bedside  Time MD responded:  2050

## 2011-12-07 ENCOUNTER — Inpatient Hospital Stay (HOSPITAL_COMMUNITY): Payer: Medicare Other

## 2011-12-07 DIAGNOSIS — R748 Abnormal levels of other serum enzymes: Secondary | ICD-10-CM

## 2011-12-07 LAB — POCT I-STAT 4, (NA,K, GLUC, HGB,HCT): Sodium: 142 mEq/L (ref 135–145)

## 2011-12-07 LAB — BASIC METABOLIC PANEL
CO2: 23 mEq/L (ref 19–32)
Chloride: 111 mEq/L (ref 96–112)
Glucose, Bld: 116 mg/dL — ABNORMAL HIGH (ref 70–99)
Potassium: 4.8 mEq/L (ref 3.5–5.1)
Sodium: 145 mEq/L (ref 135–145)

## 2011-12-07 LAB — CARDIAC PANEL(CRET KIN+CKTOT+MB+TROPI): Total CK: 3347 U/L — ABNORMAL HIGH (ref 7–232)

## 2011-12-07 LAB — CBC
Hemoglobin: 10 g/dL — ABNORMAL LOW (ref 13.0–17.0)
MCH: 31.5 pg (ref 26.0–34.0)
MCV: 94 fL (ref 78.0–100.0)
RBC: 3.17 MIL/uL — ABNORMAL LOW (ref 4.22–5.81)
WBC: 13.3 10*3/uL — ABNORMAL HIGH (ref 4.0–10.5)

## 2011-12-07 LAB — HEPARIN LEVEL (UNFRACTIONATED): Heparin Unfractionated: <0.1 IU/mL — ABNORMAL LOW (ref 0.30–0.70)

## 2011-12-07 MED ORDER — HEPARIN (PORCINE) IN NACL 100-0.45 UNIT/ML-% IJ SOLN
20.0000 [IU]/kg/h | INTRAMUSCULAR | Status: DC
  Administered 2011-12-08: 17 [IU]/kg/h via INTRAVENOUS
  Administered 2011-12-10 – 2011-12-11 (×2): 20 [IU]/kg/h via INTRAVENOUS
  Filled 2011-12-07 (×10): qty 250

## 2011-12-07 MED ORDER — ASPIRIN 300 MG RE SUPP
300.0000 mg | Freq: Every day | RECTAL | Status: DC
  Administered 2011-12-07: 300 mg via RECTAL
  Filled 2011-12-07 (×2): qty 1

## 2011-12-07 MED ORDER — BISACODYL 10 MG RE SUPP
10.0000 mg | Freq: Once | RECTAL | Status: AC
  Administered 2011-12-07: 10 mg via RECTAL
  Filled 2011-12-07: qty 1

## 2011-12-07 MED FILL — Sodium Chloride IV Soln 0.9%: INTRAVENOUS | Qty: 1000 | Status: AC

## 2011-12-07 MED FILL — Heparin Sodium (Porcine) Inj 1000 Unit/ML: INTRAMUSCULAR | Qty: 30 | Status: AC

## 2011-12-07 MED FILL — Sodium Chloride Irrigation Soln 0.9%: Qty: 3000 | Status: AC

## 2011-12-07 NOTE — Progress Notes (Signed)
Patient ID: Carmelina Dane, male   DOB: [birth year], 78 y.o.   MRN: [id number] Patient reexamined. He states that he does have some sensation and right foot and mild discomfort and right foot. Denies any shortness of breath or chest pain. No nausea or vomiting  Right lower treatment he has 3+ femoral popliteal posterior tibial pulse palpable. There is audible Doppler flow in PT, DP, and peroneal arteries. Continues to have modeling distal aspect of foot. He does have good motion and toes. Decreased sensation in distal foot.  Remains on heparin 1400 units per hour. Heparin level is pending and if not therapeutic we will increase this once again.  Plan to continue to maintain patient on heparin drip and began Coumadin in a.m. Also begin some sips of liquids tomorrow if patient ready to tolerate this.  Matt with family and gave them an update on all above items. They understand that if patient should rethrombosis tibial vessels that he will not return to the OR for further thrombectomy. Cardiology to see again later today to help clarify elevated cardiac enzymes.

## 2011-12-07 NOTE — Progress Notes (Signed)
    Subjective:  No chest pain or dyspnea.  Objective:  Vital Signs in the last 24 hours: Temp:  [97.7 F (36.5 C)-99.9 F (37.7 C)] 97.7 F (36.5 C) (08/16 1533) Pulse Rate:  [67-89] 76  (08/16 1800) Resp:  [11-23] 11  (08/16 1800) BP: (100-148)/(34-75) 103/49 mmHg (08/16 1800) SpO2:  [90 %-97 %] 91 % (08/16 1800) Arterial Line BP: (87-163)/(43-67) 116/50 mmHg (08/16 0800) FiO2 (%):  [50 %] 50 % (08/16 1600) Weight:  [100 kg (220 lb 7.4 oz)] 100 kg (220 lb 7.4 oz) (08/16 0600)  Intake/Output from previous day: 08/15 0701 - 08/16 0700 In: 3630 [I.V.:3260; NG/GT:360; IV Piggyback:10] Out: 985 [Urine:815; Emesis/NG output:100; Drains:70]  Physical Exam: Pt is alert and oriented, NAD HEENT: normal Neck: JVP - normal Lungs: CTA bilaterally CV: RRR without murmur or gallop Abd: soft, NT Ext: mild edema  Lab Results:  Basename 12/07/11 0400 12/06/11 1800  WBC 13.3* 14.3*  HGB 10.0* 10.7*  PLT 77* 82*    Basename 12/07/11 0400 12/06/11 1400  NA 145 142  K 4.8 4.8  CL 111 111  CO2 23 22  GLUCOSE 116* 131*  BUN 30* 27*  CREATININE 2.32* 2.19*    Basename 12/07/11 0806 12/06/11 1120  TROPONINI 1.06* 0.97*   Tele: personally reviewed. Sinus rhythm.  Assessment/Plan:  1. Elevated cardiac enzymes 2. S/P AAA repair 3. Ischemic right foot s/p embolectomy x 2  I have reviewed the patient's cardiac enzyme trend and clinical history. I do not think he has had an MI. The trend of a mildly increased troponin with flat trend and markedly elevated CKMB is more consistent with primary muscle release of enzyme from his ischemic leg. In addition, he hasn't had any chest pain since surgery. Will check an echo to confirm normal LV function. Will follow-up if any significant abnormalities on his echo. thx   Sherren Mocha, M.D. 12/07/2011, 6:46 PM

## 2011-12-07 NOTE — Progress Notes (Signed)
Physical Therapy Treatment Patient Details Name: KIING GROTTE MRN: [id number] DOB: [birth year] Today's Date: 12/07/2011 Time: [id number] PT Time Calculation (min): 10 min  PT Assessment / Plan / Recommendation Comments on Treatment Session  Pt adm for AAA repair.  Post-op required thrombectomy of RLE.  Currently pt unable to bear full weight on RLE.  Slow progress.    Follow Up Recommendations  Home health PT;Supervision/Assistance - 24 hour    Barriers to Discharge        Equipment Recommendations  Rolling walker with 5" wheels;3 in 1 bedside comode    Recommendations for Other Services    Frequency Min 3X/week   Plan Discharge plan remains appropriate;Frequency remains appropriate    Precautions / Restrictions Precautions Precautions: Fall Restrictions Weight Bearing Restrictions: Yes (right leg)   Pertinent Vitals/Pain Pt denies pain RLE but unable to bear full wt. Pt on face mask for O2 with sats in mid 90's.    Mobility  Bed Mobility Supine to Sit: 4: Min assist;HOB elevated Sitting - Scoot to Edge of Bed: 4: Min assist Details for Bed Mobility Assistance: assist to bring trunk up Transfers Sit to Stand: 1: +2 Total assist;From elevated surface;From bed Sit to Stand: Patient Percentage: 70% Stand to Sit: To chair/3-in-1;With upper extremity assist Stand to Sit: Patient Percentage: 70% Stand Pivot Transfers: Patient Percentage: 60% Details for Transfer Assistance: Bil HHA for transfer. Pt with shuffling pivotal steps and flexed trunk.  Pt with decr wt bearing on RLE.    Exercises     PT Diagnosis:    PT Problem List:   PT Treatment Interventions:     PT Goals Acute Rehab PT Goals PT Goal: Supine/Side to Sit - Progress: Progressing toward goal PT Goal: Sit to Supine/Side - Progress: Progressing toward goal PT Goal: Sit to Stand - Progress: Progressing toward goal PT Goal: Stand to Sit - Progress: Progressing toward goal PT Goal: Ambulate - Progress: Not  progressing  Visit Information  Last PT Received On: 12/07/11 Assistance Needed: +2    Subjective Data  Subjective: Pt reports RLE is feeling better.   Cognition  Overall Cognitive Status: Appears within functional limits for tasks assessed/performed Arousal/Alertness: Awake/alert Orientation Level: Appears intact for tasks assessed Behavior During Session: [facility] for tasks performed    Balance     End of Session PT - End of Session Equipment Utilized During Treatment: Oxygen (no gait belt due to lines/incision.) Activity Tolerance: Other (comment) (limited by inability to bear full weight on RLE) Patient left: in chair;with call bell/phone within reach;with nursing in room Nurse Communication: Mobility status   GP     [facility] 12/07/2011, 11:52 AM  [place]

## 2011-12-07 NOTE — Progress Notes (Addendum)
[facility] [place]  Post-op  Intra-abdominal Surgery note  Date of Surgery: 12/05/2011 - 12/06/2011 Surgeon: Juliann Mule): Mal Misty, MD POD: 2 Days Post-Op Procedure(s): Repair AAA EMBOLECTOMY - right tibial ANGIOGRAM EXTREMITY RIGHT  History of Present Illness  [name] is a 78 y.o. male who is 2 days post-op. Pt is stable.  complains of incisional pain; denies nausea/vomiting; denies diarrhea. has not had flatus;has not had BM Denies pain in right foot. Has decreased sensation in right foot as compared to left but can move it  Significant Diagnostic Studies: CBC Lab Results  Component Value Date   WBC 13.3* 12/07/2011   HGB 10.0* 12/07/2011   HCT 29.8* 12/07/2011   MCV 94.0 12/07/2011   PLT 77* 12/07/2011    BMET    Component Value Date/Time   NA 145 12/07/2011 0400   K 4.8 12/07/2011 0400   CL 111 12/07/2011 0400   CO2 23 12/07/2011 0400   GLUCOSE 116* 12/07/2011 0400   BUN 30* 12/07/2011 0400   CREATININE 2.32* 12/07/2011 0400   CALCIUM 8.1* 12/07/2011 0400   GFRNONAA 25* 12/07/2011 0400   GFRAA 29* 12/07/2011 0400    COAG Lab Results  Component Value Date   INR 1.21 12/05/2011   INR 1.07 11/28/2011   No results found for this basename: PTT    I/O last 3 completed shifts: In: 8651.5 [I.V.:6991.5; Blood:560; NG/GT:480; IV Piggyback:620] Out: [id number] [Urine:1510; Emesis/NG output:200; Drains:140]    Physical Examination BP Readings from Last 3 Encounters:  12/07/11 109/58  12/07/11 109/58  12/07/11 109/58   Temp Readings from Last 3 Encounters:  12/07/11 99.1 F (37.3 C)   12/07/11 99.1 F (37.3 C)   12/07/11 99.1 F (37.3 C)    SpO2 Readings from Last 3 Encounters:  12/07/11 91%  12/07/11 91%  12/07/11 91%   Pulse Readings from Last 3 Encounters:  12/07/11 67  12/07/11 67  12/07/11 67    General: A&O x 3, WDWN male in NAD Pulmonary: normal non-labored breathing , without Rales, rhonchi,  wheezing Cardiac: Heart rate :  regular ,  Abdomen:abdomen soft, non-tender and few BS NGT 100cc/24 hours  Abdominal wound:clean, dry, intact  Neurologic: A&O X 3; Appropriate Affect ; SENSATION: normal; MOTOR FUNCTION:  moving all extremities equally. Speech is fluent/normal  Vascular Exam:LLE warm and well perfused Extremities right foot cold to toes, decreased  Sensation, good motion  LOWER EXTREMITY PULSES           RIGHT                                      LEFT      POSTERIOR TIBIAL  palpable  palpable       DORSALIS PEDIS      ANTERIOR TIBIAL  palpable   Assessment/Plan: [name] is a 78 y.o. male who is 2 Days Post-Op Repair AAA EMBOLECTOMY - right tibial ANGIOGRAM EXTREMITY RIGHT Overall stable Ischemic toes demarcating on right CR up - continue hydration - ? Renal consult/ UO > 400cc/12 hours Cardiology - following enzymes, pt denies CP/SOB JP 60cc/24 hours - clotted ?dc Post-op ilieus- NGT min drainage , poss dc today - suppository today Dc swan and aline Heparin level low but PLT continue to drop - ? HIT - will send lab Pain tolerable on PCA Repeat labs tomorrow Leave Foley for strict I/O  Magdalena  J [phone] 12/07/2011 7:26 AM      Agree with above Right lower extremity with 3+ posterior tibial pulse palpable. Doppler flow present in posterior tibial and peroneal arteries right lower extremity. He does have significant ischemic changes with remodeling distal third of the foot and toes. He does have sensation which is slightly decreased but in present and motion of all toes right foot. Is not complaining of pain in right foot. Left leg has 3+ dorsalis pedis pulse palpable the foot is cool  Lungs no rhonchi or wheezing Abdomen soft appropriate postop  Plan increase heparin to 1400 units per hour and recheck upper level in 4 hours Continue to monitor platelet count and results of HIT panel Continue to monitor renal function creatinine 2.3 to (1.78 preop)-excellent urinary  output Plan DC NG, Swan-Ganz catheter, arterial line, and get patient out of bed We'll keep in SICU to closely monitor lower extremities We'll begin Coumadin tomorrow

## 2011-12-07 NOTE — Progress Notes (Signed)
Heparin level 0.11. Heparin rate currently at 1200 units per MD order. Dr Bridgett Larsson made aware. No new orders received at this time. Will continue to monitor.

## 2011-12-07 NOTE — Progress Notes (Signed)
Name: ABAS SGRO MRN: [id number] DOB: Mar 15, 1929    LOS: 2  Referring Provider:  Kellie Simmering Reason for Referral:  VDRF post vascular surgery  PULMONARY / CRITICAL CARE MEDICINE  HPI:  This is an 78 y/o male with HTN, GERD, and AAA who underwent resection and grafting of an infrarenal AAA with insertion of a common iliac graft and exploration of R popliteal artery with embolectomy of posterior tibial and anterior tibial arteries with intraoperative arteriogram of R leg on 12/05/11.  6 hours after the case he lost pulses in the R foot so he was taken back to the OR for thrombectomy of the anterior tibial, posterior tibial, and peroneal arteries.  After that procedure PCCM was consulted for vent management.  Brief patient description:  78 y/o male s/p AAA repair and R ant tibial/post tibial/peroneal arterial thrombectomy.  PCCM consulted for vent management.  Events Since Admission: 12/05/11  #1 thrombectomy of anterior tibial, posterior tibial, and peroneal arteries  #2 vein patch angioplasty of tibioperoneal trunk right leg  #3 intraoperative arteriogram right leg x2  12/05/11 #1 resection and grafting of infrarenal abdominal aortic aneurysm with insertion of aorto by common iliac graft using an 18 x 9 mm Hemashield Dacron graft  #2 exploration of right popliteal artery with embolectomy of posterior tibial and anterior tibial arteries with intraoperative arteriogram right leg  Current Status:  Extubated successfully 8/15  Vital Signs: Temp:  [99.1 F (37.3 C)-99.9 F (37.7 C)] 99.1 F (37.3 C) (08/16 0729) Pulse Rate:  [67-84] 67  (08/16 0700) Resp:  [13-24] 15  (08/16 0700) BP: (97-135)/(46-64) 109/58 mmHg (08/16 0700) SpO2:  [90 %-95 %] 91 % (08/16 0700) Arterial Line BP: (81-182)/(34-67) 114/51 mmHg (08/16 0700) FiO2 (%):  [50 %] 50 % (08/16 0400) Weight:  [100 kg (220 lb 7.4 oz)] 100 kg (220 lb 7.4 oz) (08/16 0600)  Intake/Output Summary (Last 24 hours) at 12/07/11 [id number] Last data  filed at 12/07/11 0700  Gross per 24 hour  Intake   3330 ml  Output    900 ml  Net   2430 ml    Physical Examination: Gen: ill appearing, awake, calm HEENT: ETT, PA-C, GT in place Lungs: decreased at bases, no wheezes CV: regular, no M, R foot cool Abd: soft, NT, + BS Ext: no edema, R foot cool, L warmer Neuro: awake, moves all ext   Principal Problem:  *AAA (abdominal aortic aneurysm) Active Problems:  Postoperative respiratory failure  Ischemic leg  CKD  GERD (gastroesophageal reflux disease)  CAD (coronary artery disease)  HTN (hypertension)   ASSESSMENT AND PLAN  PULMONARY  Lab 12/06/11 0739 12/06/11 0629 12/06/11 0124 12/05/11 1510 12/05/11 1331  PHART 7.334* 7.298* 7.292* 7.342* 7.290*  PCO2ART 42.0 44.6 42.2 40.4 48.9*  PO2ART 72.0* 67.0* 84.5 80.3 308.0*  HCO3 22.3 21.8 20.0 21.3 23.5  O2SAT 93.0 90.0 98.2 96.3 100.0   Ventilator Settings: Vent Mode:  [-]  FiO2 (%):  [50 %] 50 % CXR:  Bilat airspace disease and effusions, likely pulm edema ETT:  12/05/11  A:  Hypoxemic respiratory failure, post operative respiratory failure; Pulmonary edema related to volume given during multiple surgeries 8/14; Bibasilar atx due to PCA and immobility P:   -OOB, pulm Hygiene  CARDIOVASCULAR  Lab 12/06/11 1120 12/06/11 0255 12/06/11 0240 12/05/11 1920  TROPONINI 0.97* 1.01* -- 0.72*  LATICACIDVEN -- -- 2.6* --  PROBNP -- -- -- --   ECG:  NSR, no ST wave changes Lines: R SGC 8/14 >>  8/16  L radial a line 8/14 >> 8/16  A:  AAA s/p repair Ischaemic R foot, s/p repair Known CAD, mildly elevated Troponin HTN, BP stable  P:  -cardiology following, started on metoprolol IV scheduled -hep gtt -hold home lisinopril and HCTZ for now  RENAL  Lab 12/07/11 0400 12/06/11 1400 12/06/11 0230 12/05/11 2135 12/05/11 1519  NA 145 142 141 143 143  K 4.8 4.8 -- -- --  CL 111 111 110 -- 110  CO2 23 22 23  -- 22  BUN 30* 27* 23 -- 23  CREATININE 2.32* 2.19* 1.88* -- 1.69*    CALCIUM 8.1* 7.9* 7.6* -- 7.8*  MG -- -- 1.6 -- 1.2*  PHOS -- -- -- -- --   Intake/Output      08/15 0701 - 08/16 0700 08/16 0701 - 08/17 0700   I.V. (mL/kg) 3260 (32.6)    Blood     NG/GT 360    IV Piggyback 10    Total Intake(mL/kg) 3630 (36.3)    Urine (mL/kg/hr) 815 (0.3)    Emesis/NG output 100    Drains 70    Blood     Total Output 985    Net +2645          Foley:  8/14  A:  CKD, s/p multiple procedures aortograms, now w apparent evolving acute renal injury hyperkalemia P:   -hold off on ACE-I, HCTZ, other renal toxic meds -IVF as he can tolerate  GASTROINTESTINAL  Lab 12/06/11 0230  AST 90*  ALT 92*  ALKPHOS 40  BILITOT 1.0  PROT 5.0*  ALBUMIN 3.0*    A:  No acute issues P:   -advance diet as tolerated and per VVS recs HEMATOLOGIC  Lab 12/07/11 0400 12/06/11 1800 12/06/11 1224 12/06/11 0230 12/05/11 2135 12/05/11 1519  HGB 10.0* 10.7* 10.8* 11.3* 10.2* --  HCT 29.8* 31.5* 32.1* 33.7* 30.0* --  PLT 77* 82* 84* 109* -- 124*  INR -- -- -- -- -- 1.21  APTT -- -- -- 193* -- 36   A:  Anemia, Hgb stable P:  -monitor H/H  INFECTIOUS  Lab 12/07/11 0400 12/06/11 1800 12/06/11 1224 12/06/11 0230 12/05/11 1519  WBC 13.3* 14.3* 14.3* 13.5* 13.3*  PROCALCITON -- -- -- -- --   Cultures:  Antibiotics:   A:  No acute issues P:   -monitor fever curve  ENDOCRINE  Lab 12/05/11 0026  GLUCAP 135*   A:  Hypothyroid   P:   -started synthroid IV 8/15, convert to PO when possible  NEUROLOGIC  A:  Sedation to off 7/15 P:      BEST PRACTICE / DISPOSITION Level of Care:  ICU Primary Service:  Vascular Consultants:  PCCM Code Status:  full Diet:  Npo >> attempt sips/chips once extubated DVT Px:  Hep gtt GI Px:  ppi Skin Integrity:  normal Social / Family:  None at bedside  PCCM will sign off, please call us if we can help in any way  Baltazar Apo, MD, PhD 12/07/2011, 9:22 AM [facility] [phone] or if no answer  [phone]

## 2011-12-08 ENCOUNTER — Inpatient Hospital Stay (HOSPITAL_COMMUNITY): Payer: Medicare Other

## 2011-12-08 DIAGNOSIS — I517 Cardiomegaly: Secondary | ICD-10-CM

## 2011-12-08 LAB — CBC
HCT: 27.2 % — ABNORMAL LOW (ref 39.0–52.0)
Hemoglobin: 8.8 g/dL — ABNORMAL LOW (ref 13.0–17.0)
MCHC: 32.4 g/dL (ref 30.0–36.0)
MCV: 96.8 fL (ref 78.0–100.0)
RDW: 15.2 % (ref 11.5–15.5)

## 2011-12-08 LAB — BASIC METABOLIC PANEL
BUN: 35 mg/dL — ABNORMAL HIGH (ref 6–23)
CO2: 23 mEq/L (ref 19–32)
Chloride: 113 mEq/L — ABNORMAL HIGH (ref 96–112)
Creatinine, Ser: 2.41 mg/dL — ABNORMAL HIGH (ref 0.50–1.35)
Glucose, Bld: 91 mg/dL (ref 70–99)
Potassium: 5 mEq/L (ref 3.5–5.1)

## 2011-12-08 MED ORDER — ASPIRIN EC 81 MG PO TBEC
81.0000 mg | DELAYED_RELEASE_TABLET | Freq: Every day | ORAL | Status: DC
  Administered 2011-12-08 – 2011-12-13 (×6): 81 mg via ORAL
  Filled 2011-12-08 (×6): qty 1

## 2011-12-08 MED ORDER — PATIENT'S GUIDE TO USING COUMADIN BOOK
Freq: Once | Status: DC
  Filled 2011-12-08: qty 1

## 2011-12-08 MED ORDER — ASPIRIN 81 MG PO TBEC: 81.0000 mg | DELAYED_RELEASE_TABLET | Freq: Every day | ORAL | Status: DC

## 2011-12-08 MED ORDER — WARFARIN - PHARMACIST DOSING INPATIENT: Freq: Every day | Status: DC

## 2011-12-08 MED ORDER — WARFARIN SODIUM 10 MG PO TABS
10.0000 mg | ORAL_TABLET | Freq: Once | ORAL | Status: AC
  Administered 2011-12-08: 10 mg via ORAL
  Filled 2011-12-08: qty 1

## 2011-12-08 MED ORDER — WARFARIN VIDEO: Freq: Once | Status: DC

## 2011-12-08 MED ORDER — METOPROLOL TARTRATE 50 MG PO TABS
50.0000 mg | ORAL_TABLET | Freq: Two times a day (BID) | ORAL | Status: DC
  Administered 2011-12-08 – 2011-12-09 (×3): 50 mg via ORAL
  Filled 2011-12-08 (×4): qty 1

## 2011-12-08 MED ORDER — FUROSEMIDE 10 MG/ML IJ SOLN
40.0000 mg | Freq: Two times a day (BID) | INTRAMUSCULAR | Status: DC
  Administered 2011-12-08 – 2011-12-09 (×3): 40 mg via INTRAVENOUS
  Filled 2011-12-08 (×5): qty 4

## 2011-12-08 MED ORDER — ATORVASTATIN CALCIUM 80 MG PO TABS
80.0000 mg | ORAL_TABLET | Freq: Every day | ORAL | Status: DC
  Administered 2011-12-08 – 2011-12-13 (×6): 80 mg via ORAL
  Filled 2011-12-08 (×6): qty 1

## 2011-12-08 NOTE — Progress Notes (Signed)
  Echocardiogram 2D Echocardiogram has been performed.  Alvin Critchley 12/08/2011, 11:26 AM

## 2011-12-08 NOTE — Progress Notes (Addendum)
[facility] [place]  Progress Note Bypass Surgery  Date of Surgery: 12/05/2011 - 12/06/2011  Procedure(s): EMBOLECTOMY ANGIOGRAM EXTREMITY RIGHT Surgeon: Surgeon(s): Mal Misty, MD  3 Days Post-Op  History of Present Illness  [name] is a 78 y.o. male who is S/P Procedure(s): EMBOLECTOMY ANGIOGRAM EXTREMITY RIGHT right.  The patient's pre-op symptoms of pain s/p AAA repair  With decreased sensation and dusky toes . Patients pain is well controlled.       Imaging: Dg Chest Port 1 View  12/07/2011  *RADIOLOGY REPORT*  Clinical Data: Evaluate endotracheal tube and lines  PORTABLE CHEST - 1 VIEW  Comparison: Portable chest x-ray of 12/06/2011  Findings: The endotracheal tube is no longer seen.  There is poor aeration with bibasilar opacities consistent with atelectasis and small effusions remaining.  The Swan-Ganz catheter is unchanged in position and an NG tube remains.  Heart size is stable.  IMPRESSION:  1.  Endotracheal tube removed. 2.  Little change in poor aeration and bibasilar opacities consistent with atelectasis and small effusions.  Original Report Authenticated By: Joretta Bachelor, M.D.    Significant Diagnostic Studies: CBC Lab Results  Component Value Date   WBC 11.4* 12/08/2011   HGB 8.8* 12/08/2011   HCT 27.2* 12/08/2011   MCV 96.8 12/08/2011   PLT 81* 12/08/2011    BMET     Component Value Date/Time   NA 145 12/08/2011 0510   K 5.0 12/08/2011 0510   CL 113* 12/08/2011 0510   CO2 23 12/08/2011 0510   GLUCOSE 91 12/08/2011 0510   BUN 35* 12/08/2011 0510   CREATININE 2.41* 12/08/2011 0510   CALCIUM 8.6 12/08/2011 0510   GFRNONAA 24* 12/08/2011 0510   GFRAA 27* 12/08/2011 0510    COAG Lab Results  Component Value Date   INR 1.21 12/05/2011   INR 1.07 11/28/2011   No results found for this basename: PTT    Physical Examination  BP Readings from Last 3 Encounters:  12/08/11 101/51  12/08/11 101/51  12/08/11 101/51   Temp Readings  from Last 3 Encounters:  12/08/11 98.1 F (36.7 C) Oral  12/08/11 98.1 F (36.7 C) Oral  12/08/11 98.1 F (36.7 C) Oral   SpO2 Readings from Last 3 Encounters:  12/08/11 99%  12/08/11 99%  12/08/11 99%   Pulse Readings from Last 3 Encounters:  12/08/11 77  12/08/11 77  12/08/11 77    Pt is A&O x 3 right lower extremity: Incision/s is/are clean,dry.intact, and  clean, dry, intact or healing without hematoma, erythema or drainage Limb is warm; with good color.  The distal toes are dusky and cool to touch, he has intact motion of the digits. LLE warm and well perfused   Right Dorsalis Pedis pulse is biphasic by Doppler RightPosterior tibial pulse is  biphasic by Doppler     Assessment/Plan: Pt. Doing well Post-op pain is controlled Wounds are clean, dry, intact or healing well PT/OT for ambulation Will give coumadin 10 mg tonight and then have pharmacy dose it. heparin 0.18 will discuss dose with Dr. Oneida Alar.  [name] [facility] [id number] 12/08/2011 9:14 AM   Increased oxygen requirements overnight.  ECHO currently in progress.  Overall feels ok but dyspnea with minimal exertion Appreciate Dr Haroldine Laws involvement. No BM yet currently getting ice chips.  Abdomen healing wound, soft  Extremities both feet warm, right ankle edematous difficult to palpate pulse but brisk doppler Right forefoot cool and toes beginning to demarcate, JP drain  minimal Left foot pink and warm  Assessment: 1Increased oxygen requirement ruling out cardiac cause 2. Ischemic toes continue to slowly titrate heparin coumadin started yesterday per Dr Kellie Simmering 3 Renal dysfunction creatinine stable at 2.4 may need some gentle diuresis will defer to Dr Haroldine Laws based on ECHO 4. Blood loss anemia and thrombocytopenia.  Will continue to follow for now but if continues to drift down Hgb may need transfusion 5 Continue to cautiously titrate heparin  Gerald Hinds, MD [facility] [place]: [phone] Pager: [phone]  4.

## 2011-12-08 NOTE — Progress Notes (Addendum)
    Subjective:  Extubated and off pressors. No chest pain. Desatted last night and now wearing FM   Echo still pending.    Objective:  Vital Signs in the last 24 hours: Temp:  [97.7 F (36.5 C)-99.6 F (37.6 C)] 98.1 F (36.7 C) (08/17 0708) Pulse Rate:  [69-115] 77  (08/17 0800) Resp:  [10-23] 14  (08/17 0800) BP: (92-136)/(34-75) 101/51 mmHg (08/17 0800) SpO2:  [90 %-99 %] 99 % (08/17 0800) FiO2 (%):  [50 %-100 %] 80 % (08/17 0800) Weight:  [101 kg (222 lb 10.6 oz)] 101 kg (222 lb 10.6 oz) (08/17 0600)  Intake/Output from previous day: 08/16 0701 - 08/17 0700 In: 2845.8 [I.V.:2815.8; NG/GT:30] Out: 37 [Urine:760; Drains:10]  Physical Exam: Pt is alert and oriented. Wearing FM HEENT: normal Neck: JVP hard to see Lungs: mild basilar crackles CV: Distant RRR without murmur or gallop Abd: surgical dressings in place Ext: mild edema. R foot discolored  Lab Results:  Basename 12/08/11 0510 12/07/11 0400  WBC 11.4* 13.3*  HGB 8.8* 10.0*  PLT 81* 77*    Basename 12/08/11 0510 12/07/11 0400  NA 145 145  K 5.0 4.8  CL 113* 111  CO2 23 23  GLUCOSE 91 116*  BUN 35* 30*  CREATININE 2.41* 2.32*    Basename 12/07/11 0806 12/06/11 1120  TROPONINI 1.06* 0.97*   Tele: personally reviewed. Sinus rhythm.  Assessment/Plan:  1. Elevated cardiac enzymes 2. S/P AAA repair 3. Ischemic right foot s/p embolectomy x 2 4. Respiratory distress  Agree with Dr. Burt Knack. I don't think he has had a significant cardiac event. Given PAD he likely does have some underlying CAD but recent Myoview was normal and chronic renal insufficiency precludes cath unless high-risk situation. Would treat with ASA, b-blocker and statin as tolerated. (will change meds to po) Will review echo. Appears significantly volume overloaded. Weight up 20-25 pounds from baseline  Will check CVP and start IV lasix as tolerated.  IVFs stopped.   Glori Bickers, M.D. 12/08/2011, 10:03 AM

## 2011-12-08 NOTE — Progress Notes (Signed)
Physical Therapy Treatment Patient Details Name: [name] MRN: [id number] DOB: [birth year] Today's Date: 12/08/2011 Time: [id number] PT Time Calculation (min): 14 min  PT Assessment / Plan / Recommendation Comments on Treatment Session  Pt adm for AAA repair.  Post-op required thrombectomy of RLE and now with ischemic foot.  Progress slow due to medical issues.  May have to rethink DC to home.    Follow Up Recommendations  Home health PT;Supervision/Assistance - 24 hour    Barriers to Discharge        Equipment Recommendations  Rolling walker with 5" wheels;3 in 1 bedside comode    Recommendations for Other Services    Frequency Min 3X/week   Plan Discharge plan remains appropriate;Frequency remains appropriate    Precautions / Restrictions Precautions Precautions: Fall Precaution Comments: Currently pt transfers only and no amb due to ischemic rt foot.   Pertinent Vitals/Pain RLE 8/10 - using PCA    Mobility  Bed Mobility Supine to Sit: 4: Min assist;HOB elevated Sitting - Scoot to Edge of Bed: 3: Mod assist Details for Bed Mobility Assistance: Used bed pad to bring hips to EOB. Transfers Sit to Stand: 1: +2 Total assist;With upper extremity assist;From bed Sit to Stand: Patient Percentage: 60% Stand to Sit: 1: +2 Total assist;Without upper extremity assist;To chair/3-in-1 Stand to Sit: Patient Percentage: 60% Stand Pivot Transfers: 1: +2 Total assist Stand Pivot Transfers: Patient Percentage: 60% Details for Transfer Assistance: Pt used RW for transfer which improved his stability and allowed him to [place] RLE more effectively.  Pt didn't reach back for chair and needed assist to control descent.    Exercises     PT Diagnosis:    PT Problem List:   PT Treatment Interventions:     PT Goals Acute Rehab PT Goals PT Goal: Supine/Side to Sit - Progress: Progressing toward goal PT Goal: Sit to Stand - Progress: Progressing toward goal PT Goal: Stand to Sit -  Progress: Progressing toward goal PT Goal: Ambulate - Progress: Not progressing PT Goal: Up/Down Stairs - Progress: Not progressing  Visit Information  Last PT Received On: 12/08/11 Assistance Needed: +2    Subjective Data  Subjective: Pt states his RLE is what bothers him most.   Cognition  Overall Cognitive Status: Appears within functional limits for tasks assessed/performed Arousal/Alertness: Awake/alert Orientation Level: Appears intact for tasks assessed Behavior During Session: [facility] for tasks performed    Balance  Static Standing Balance Static Standing - Balance Support: Bilateral upper extremity supported (on rolling walker) Static Standing - Level of Assistance: 4: Min assist  End of Session PT - End of Session Equipment Utilized During Treatment: Gait belt;Oxygen Activity Tolerance: Other (comment);Patient limited by fatigue (limited by transfers only due to ischemic toes) Patient left: in chair;with call bell/phone within reach Nurse Communication: Mobility status   GP     [facility] 12/08/2011, 2:42 PM  [facility] PT [phone]

## 2011-12-08 NOTE — Progress Notes (Signed)
ANTICOAGULATION CONSULT NOTE - Initial Consult  Pharmacy Consult for Heparin per MD, Coumadin Indication: s/p embolectomy, thrombectomy with AAA repair  No Known Allergies  Patient Measurements: Height: 6' (182.9 cm) Weight: 222 lb 10.6 oz (101 kg) IBW/kg (Calculated) : 77.6   Labs:  Basename 12/08/11 0510 12/07/11 1250 12/07/11 0806 12/07/11 0400 12/06/11 1800 12/06/11 1400 12/06/11 1120 12/06/11 0255 12/06/11 0230 12/05/11 1519  HGB 8.8* -- -- 10.0* -- -- -- -- -- --  HCT 27.2* -- -- 29.8* 31.5* -- -- -- -- --  PLT 81* -- -- 77* 82* -- -- -- -- --  APTT -- -- -- -- -- -- -- -- 193* 36  LABPROT -- -- -- -- -- -- -- -- -- 15.6*  INR -- -- -- -- -- -- -- -- -- 1.21  HEPARINUNFRC 0.18* <0.10* -- 0.11* -- -- -- -- -- --  CREATININE 2.41* -- -- 2.32* -- 2.19* -- -- -- --  CKTOTAL -- -- 3347* -- -- -- 980* 329* -- --  CKMB -- -- 97.2* -- -- -- 25.6* 9.4* -- --  TROPONINI -- -- 1.06* -- -- -- 0.97* 1.01* -- --    Estimated Creatinine Clearance: 29.6 ml/min (by C-G formula based on Cr of 2.41).   Medical History: Past Medical History  Diagnosis Date  . AAA (abdominal aortic aneurysm)   . GERD (gastroesophageal reflux disease)   . Arthritis   . Hypertension   . Chronic kidney disease     Renal insufficiency  . Coronary artery disease   . Hemorrhoid     Assessment: 78 yo male s/p AAA who underwent resection and grafting of an infrarenal AAA with insertion of a common iliac graft and exploration of R popliteal artery with embolectomy of posterior tibial and anterior tibial arteries with intraoperative arteriogram of R leg on 12/05/11. 6 hours after the case he lost pulses in the R foot so he was taken back to the OR for thrombectomy of the anterior tibial, posterior tibial, and peroneal arteries.  Heparin per MD with level of 0.18 this AM  Starting Coumadin this evening - 10 mg tonight per MD  Goal of Therapy:  INR 2-3 Monitor platelets by anticoagulation protocol: Yes     Plan:  1) Coumadin 10 mg po x 1 dose tonight per MD 2) Daily INR 3) Continue to follow heparin dosing per MD  Thank you. Anette Guarneri, PharmD [phone]  12/08/2011,9:33 AM

## 2011-12-09 LAB — CBC
HCT: 27.9 % — ABNORMAL LOW (ref 39.0–52.0)
Hemoglobin: 9.2 g/dL — ABNORMAL LOW (ref 13.0–17.0)
MCH: 32.2 pg (ref 26.0–34.0)
MCHC: 33 g/dL (ref 30.0–36.0)
MCV: 97.6 fL (ref 78.0–100.0)
Platelets: 115 10*3/uL — ABNORMAL LOW (ref 150–400)
RBC: 2.86 MIL/uL — ABNORMAL LOW (ref 4.22–5.81)
RDW: 14.8 % (ref 11.5–15.5)
WBC: 11.1 10*3/uL — ABNORMAL HIGH (ref 4.0–10.5)

## 2011-12-09 LAB — BASIC METABOLIC PANEL
BUN: 43 mg/dL — ABNORMAL HIGH (ref 6–23)
CO2: 23 mEq/L (ref 19–32)
Calcium: 9.3 mg/dL (ref 8.4–10.5)
GFR calc non Af Amer: 21 mL/min — ABNORMAL LOW (ref >90)
Glucose, Bld: 87 mg/dL (ref 70–99)
Sodium: 148 mEq/L — ABNORMAL HIGH (ref 135–145)

## 2011-12-09 LAB — TYPE AND SCREEN
ABO/RH(D): O POS
Antibody Screen: NEGATIVE
Unit division: 0
Unit division: 0
Unit division: 0

## 2011-12-09 LAB — HEPARIN LEVEL (UNFRACTIONATED): Heparin Unfractionated: 0.42 IU/mL (ref 0.30–0.70)

## 2011-12-09 LAB — PROTIME-INR
INR: 1.01 (ref 0.00–1.49)
Prothrombin Time: 13.5 s (ref 11.6–15.2)

## 2011-12-09 MED ORDER — METOPROLOL TARTRATE 25 MG PO TABS
25.0000 mg | ORAL_TABLET | Freq: Two times a day (BID) | ORAL | Status: DC
  Administered 2011-12-10 – 2011-12-13 (×7): 25 mg via ORAL
  Filled 2011-12-09 (×9): qty 1

## 2011-12-09 MED ORDER — SODIUM CHLORIDE 0.45 % IV SOLN
INTRAVENOUS | Status: DC
  Administered 2011-12-09 – 2011-12-10 (×2): 30 mL/h via INTRAVENOUS

## 2011-12-09 MED ORDER — WARFARIN SODIUM 10 MG PO TABS
10.0000 mg | ORAL_TABLET | Freq: Once | ORAL | Status: AC
  Administered 2011-12-09: 10 mg via ORAL
  Filled 2011-12-09: qty 1

## 2011-12-09 NOTE — Progress Notes (Signed)
    Subjective:   Lasix started yesterday for volume overload. Down a couple of pounds. Still requiring 60-80% FM to maintain sats. CXR without CHF.  Sitting up in chair. Drinking clear liquids. No BM or flatus. No CP.   CVP (checked personally) 6-7.   Objective:  Vital Signs in the last 24 hours: Temp:  [97.3 F (36.3 C)-98.5 F (36.9 C)] 98.1 F (36.7 C) (08/18 0717) Pulse Rate:  [61-95] 72  (08/18 0700) Resp:  [9-22] 16  (08/18 0700) BP: (92-139)/(36-80) 122/52 mmHg (08/18 0700) SpO2:  [87 %-100 %] 96 % (08/18 0700) FiO2 (%):  [50 %-80 %] 80 % (08/18 0700) Weight:  [99.4 kg (219 lb 2.2 oz)] 99.4 kg (219 lb 2.2 oz) (08/18 0600)  Intake/Output from previous day: 08/17 0701 - 08/18 0700 In: 1246.8 [P.O.:240; I.V.:1006.8] Out: 2730 [Urine:2730]  Physical Exam: Pt is alert and oriented. Wearing FM HEENT: normal Neck: JVP hard to see Lungs: mild basilar crackles CV: Distant RRR without murmur or gallop Abd: surgical dressings in place. Distended. Quiet. nontender Ext: tr edema. R foot discolored  Lab Results:  Basename 12/09/11 0550 12/08/11 0510  WBC 11.1* 11.4*  HGB 9.2* 8.8*  PLT 115* 81*    Basename 12/09/11 0550 12/08/11 0510  NA 148* 145  K 4.4 5.0  CL 113* 113*  CO2 23 23  GLUCOSE 87 91  BUN 43* 35*  CREATININE 2.61* 2.41*    Basename 12/07/11 0806 12/06/11 1120  TROPONINI 1.06* 0.97*   Tele: personally reviewed. Sinus rhythm.  Assessment/Plan:  1. Elevated cardiac enzymes 2. S/P AAA repair 3. Ischemic right foot s/p embolectomy x 2 4. Respiratory distress 5. A/C renal failure 6. Hypernatremia 7. Post-op ileus  Agree with Dr. Burt Knack. I don't think he has had a significant cardiac event. Given PAD he likely does have some underlying CAD but recent Myoview was normal and chronic renal insufficiency precludes cath unless high-risk situation.  Weight is up from baseline but CVP Ok and CXR without pulmonary edema. Cr climbing slowly. Will stop  lasix. Can dose as needed to keep CVP 8-12.   Wean O2 as tolerated.   Glori Bickers, M.D. 12/09/2011, 9:22 AM

## 2011-12-09 NOTE — Progress Notes (Addendum)
[facility] [place]  Progress Note Bypass Surgery  Date of Surgery: 12/05/2011 - 12/06/2011  Procedure(s): EMBOLECTOMY ANGIOGRAM EXTREMITY RIGHT Surgeon: Surgeon(s): Mal Misty, MD  4 Days Post-Op  History of Present Illness  [name] is a 78 y.o. male who is S/P AAA repair Procedure(s): EMBOLECTOMY ANGIOGRAM EXTREMITY RIGHT right.  The patient's pre-op symptoms of pain are Improved . Patients pain is well controlled.  The right foot toes feel very cold to him, but his pain is well controlled.     Imaging: Dg Chest Port 1 View  12/08/2011  *RADIOLOGY REPORT*  Clinical Data: Chest pain.  PORTABLE CHEST - 1 VIEW  Comparison: NG tube and right IJ approach Swan-Ganz catheter have been removed.  Right IJ sheath remains in place.  Findings: The patient has small bilateral pleural effusions and basilar atelectasis, more prominent than left.  The patient's left effusion appears decreased.  No pneumothorax identified.  Lung volumes are low.  Heart size appears enlarged.  IMPRESSION:  1.  Status post NG tube and Swan-Ganz catheter removal with a right IJ sheath in place. 2.  Small bilateral pleural effusions and basilar atelectasis, greater on the left.  Left basilar aeration appears slightly improved.  Original Report Authenticated By: Arvid Right. Luther Parody, M.D.    Significant Diagnostic Studies: CBC Lab Results  Component Value Date   WBC 11.1* 12/09/2011   HGB 9.2* 12/09/2011   HCT 27.9* 12/09/2011   MCV 97.6 12/09/2011   PLT 115* 12/09/2011    BMET     Component Value Date/Time   NA 148* 12/09/2011 0550   K 4.4 12/09/2011 0550   CL 113* 12/09/2011 0550   CO2 23 12/09/2011 0550   GLUCOSE 87 12/09/2011 0550   BUN 43* 12/09/2011 0550   CREATININE 2.61* 12/09/2011 0550   CALCIUM 9.3 12/09/2011 0550   GFRNONAA 21* 12/09/2011 0550   GFRAA 25* 12/09/2011 0550    COAG Lab Results  Component Value Date   INR 1.01 12/09/2011   INR 1.21 12/05/2011   INR 1.07 11/28/2011    No results found for this basename: PTT    Physical Examination  BP Readings from Last 3 Encounters:  12/09/11 122/52  12/09/11 122/52  12/09/11 122/52   Temp Readings from Last 3 Encounters:  12/09/11 98.1 F (36.7 C) Oral  12/09/11 98.1 F (36.7 C) Oral  12/09/11 98.1 F (36.7 C) Oral   SpO2 Readings from Last 3 Encounters:  12/09/11 96%  12/09/11 96%  12/09/11 96%   Pulse Readings from Last 3 Encounters:  12/09/11 72  12/09/11 72  12/09/11 72    Pt is A&O x 3 right lower extremity: Incision/s is/are clean,dry.intact, and  healing without hematoma, erythema or drainage Limb is warm; with good color on the left. Right lower extremity toes are dark and cool. Abdominal incision clean and dry Right Dorsalis Pedis pulse is biphasic by Doppler RightPosterior tibial pulse is  biphasic by Doppler    Assessment/Plan: Pt. Doing well Post-op pain is controlled Wounds are clean, dry, intact or healing well PT/OT for ambulation He is currently on a clear diet. We will discuss advancing his diet and heparin dose with Dr. Oneida Alar. Coumadin dose per pharmacy.  Laurence Slate [facility] [id number] 12/09/2011 7:39 AM  Mild confusion, still requiring oxygen by mask Right foot some pain Still no flatus or BM, taking some liquids  Incisions healing Right hindfoot warm, forefoot cool and purple  Hypernatremia will give some hypotonic fluid  Volume overload on Lasix BID per Dr Haroldine Laws Renal dysfunction probably multifactorial creatinine slowly rising, consider renal consult if worsens Blood loss anemia stable Heparin coumadin levels pending on 2000 units per hour platelets now stable  Ruta Hinds, MD [facility] [place] Office: [phone] Pager: [phone]

## 2011-12-09 NOTE — Progress Notes (Signed)
ANTICOAGULATION CONSULT NOTE - Follow-Up Consult  Pharmacy Consult for Heparin per MD, Coumadin Indication: s/p embolectomy, thrombectomy with AAA repair  No Known Allergies  Patient Measurements: Height: 6' (182.9 cm) Weight: 219 lb 2.2 oz (99.4 kg) IBW/kg (Calculated) : 77.6   Labs:  Basename 12/09/11 0550 12/08/11 0510 12/07/11 1250 12/07/11 0806 12/07/11 0400 12/06/11 1120  HGB 9.2* 8.8* -- -- -- --  HCT 27.9* 27.2* -- -- 29.8* --  PLT 115* 81* -- -- 77* --  APTT -- -- -- -- -- --  LABPROT 13.5 -- -- -- -- --  INR 1.01 -- -- -- -- --  HEPARINUNFRC -- 0.18* <0.10* -- 0.11* --  CREATININE 2.61* 2.41* -- -- 2.32* --  CKTOTAL -- -- -- 3347* -- 980*  CKMB -- -- -- 97.2* -- 25.6*  TROPONINI -- -- -- 1.06* -- 0.97*    Estimated Creatinine Clearance: 27.1 ml/min (by C-G formula based on Cr of 2.61).   Medical History: Past Medical History  Diagnosis Date  . AAA (abdominal aortic aneurysm)   . GERD (gastroesophageal reflux disease)   . Arthritis   . Hypertension   . Chronic kidney disease     Renal insufficiency  . Coronary artery disease   . Hemorrhoid     Assessment: 78 yo male s/p AAA who underwent resection and grafting of an infrarenal AAA with insertion of a common iliac graft and exploration of R popliteal artery with embolectomy of posterior tibial and anterior tibial arteries with intraoperative arteriogram of R leg on 12/05/11. 6 hours after the case he lost pulses in the R foot so he was taken back to the OR for thrombectomy of the anterior tibial, posterior tibial, and peroneal arteries.  INR = 1.01  Goal of Therapy:  INR 2-3 Monitor platelets by anticoagulation protocol: Yes   Plan:  1) Coumadin 10 mg po x 1 dose tonight 2) Daily INR 3) Continue to follow heparin dosing per MD  Thank you. Anette Guarneri, PharmD [phone]  12/09/2011,7:50 AM

## 2011-12-10 LAB — CBC
HCT: 23.8 % — ABNORMAL LOW (ref 39.0–52.0)
Hemoglobin: 7.9 g/dL — ABNORMAL LOW (ref 13.0–17.0)
MCH: 31.6 pg (ref 26.0–34.0)
MCHC: 33.2 g/dL (ref 30.0–36.0)
MCV: 95.2 fL (ref 78.0–100.0)
Platelets: 105 10*3/uL — ABNORMAL LOW (ref 150–400)
RBC: 2.5 MIL/uL — ABNORMAL LOW (ref 4.22–5.81)
RDW: 14.5 % (ref 11.5–15.5)
WBC: 8.4 10*3/uL (ref 4.0–10.5)

## 2011-12-10 LAB — PROTIME-INR
INR: 1.48 (ref 0.00–1.49)
Prothrombin Time: 18.2 seconds — ABNORMAL HIGH (ref 11.6–15.2)

## 2011-12-10 LAB — HEPARIN LEVEL (UNFRACTIONATED): Heparin Unfractionated: 0.25 IU/mL — ABNORMAL LOW (ref 0.30–0.70)

## 2011-12-10 LAB — HEPARIN INDUCED THROMBOCYTOPENIA PNL
Patient O.D.: 0.208
UFH High Dose UFH H: 0 % Release
UFH Low Dose 0.1 IU/mL: 0 % Release
UFH SRA Result: NEGATIVE

## 2011-12-10 LAB — BASIC METABOLIC PANEL
Chloride: 107 mEq/L (ref 96–112)
GFR calc Af Amer: 27 mL/min — ABNORMAL LOW (ref >90)
Potassium: 3.7 mEq/L (ref 3.5–5.1)
Sodium: 141 mEq/L (ref 135–145)

## 2011-12-10 MED ORDER — METOCLOPRAMIDE HCL 5 MG PO TABS
5.0000 mg | ORAL_TABLET | Freq: Three times a day (TID) | ORAL | Status: DC
  Administered 2011-12-10 – 2011-12-13 (×15): 5 mg via ORAL
  Filled 2011-12-10 (×17): qty 1

## 2011-12-10 MED ORDER — BISACODYL 10 MG RE SUPP
10.0000 mg | Freq: Once | RECTAL | Status: AC
  Administered 2011-12-10: 10 mg via RECTAL
  Filled 2011-12-10: qty 1

## 2011-12-10 MED ORDER — HYDROMORPHONE HCL PF 1 MG/ML IJ SOLN
0.5000 mg | INTRAMUSCULAR | Status: DC | PRN
  Administered 2011-12-11: 0.5 mg via INTRAVENOUS
  Filled 2011-12-10: qty 1

## 2011-12-10 MED ORDER — WARFARIN SODIUM 7.5 MG PO TABS
7.5000 mg | ORAL_TABLET | Freq: Once | ORAL | Status: AC
  Administered 2011-12-10: 7.5 mg via ORAL
  Filled 2011-12-10: qty 1

## 2011-12-10 MED ORDER — OXYCODONE HCL 5 MG PO TABS
5.0000 mg | ORAL_TABLET | ORAL | Status: DC | PRN
  Administered 2011-12-10 – 2011-12-13 (×10): 5 mg via ORAL
  Filled 2011-12-10 (×2): qty 1
  Filled 2011-12-10: qty 2
  Filled 2011-12-10 (×2): qty 1
  Filled 2011-12-10: qty 2
  Filled 2011-12-10 (×3): qty 1

## 2011-12-10 NOTE — Progress Notes (Signed)
PROGRESS NOTE  Subjective:   Pt is s/p AAA surgery.  Had a normal stress myoview 7/29. Normal LV systolic function with EF of 60%.   Objective:    Vital Signs:   Temp:  [97.5 F (36.4 C)-98.7 F (37.1 C)] 97.8 F (36.6 C) (08/19 0717) Pulse Rate:  [58-76] 76  (08/19 0700) Resp:  [12-23] 23  (08/19 0700) BP: (93-148)/(42-103) 141/49 mmHg (08/19 0700) SpO2:  [89 %-100 %] 90 % (08/19 0700) FiO2 (%):  [40 %-50 %] 40 % (08/19 0400) Weight:  [216 lb 14.9 oz (98.4 kg)] 216 lb 14.9 oz (98.4 kg) (08/19 0700)      24-hour weight change: Weight change: -2 lb 3.3 oz (-1 kg)  Weight trends: Filed Weights   12/08/11 0600 Jan 04, 2012 0600 12/10/11 0700  Weight: 222 lb 10.6 oz (101 kg) 219 lb 2.2 oz (99.4 kg) 216 lb 14.9 oz (98.4 kg)    Intake/Output:  01-04-23 0701 - 08/19 0700 In: 1629.5 [P.O.:480; I.V.:1149.5] Out: 3185 [Urine:3185]     Physical Exam: BP 141/49  Pulse 76  Temp 97.8 F (36.6 C) (Oral)  Resp 23  Ht 6' (1.829 m)  Wt 216 lb 14.9 oz (98.4 kg)  BMI 29.42 kg/m2  SpO2 90%  General: Vital signs reviewed and noted. Well-developed, well-nourished, in no acute distress; alert, appropriate and cooperative .  Head: Normocephalic, atraumatic.  Eyes: conjunctivae/corneas clear.  EOM's intact.   Throat: normal  Neck: Supple. Normal carotids. No JVD  Lungs:  Clear  Heart: Regular rate,  With normal  S1 S2. No murmurs, gallops or rubs  Abdomen:  Soft, non-tender, non-distended with normoactive bowel sounds. No hepatomegaly. No rebound/guarding. No abdominal masses.  Extremities: Distal pedal pulses are 2+ .  No edema.    Neurologic: A&O X3, CN II - XII are grossly intact. Motor strength is 5/5 in the all 4 extremities.  Psych: Responds to questions appropriately with normal affect.    Labs: BMET:  Basename 12/10/11 0435 Jan 04, 2012 0550  NA 141 148*  K 3.7 4.4  CL 107 113*  CO2 25 23  GLUCOSE 95 87  BUN 43* 43*  CREATININE 2.44* 2.61*  CALCIUM 8.7 9.3  MG -- --    PHOS -- --    Liver function tests: No results found for this basename: AST:2,ALT:2,ALKPHOS:2,BILITOT:2,PROT:2,ALBUMIN:2 in the last 72 hours No results found for this basename: LIPASE:2,AMYLASE:2 in the last 72 hours  CBC:  Basename 12/10/11 0435 2012-01-04 0550  WBC 8.4 11.1*  NEUTROABS -- --  HGB 7.9* 9.2*  HCT 23.8* 27.9*  MCV 95.2 97.6  PLT 105* 115*    Cardiac Enzymes: No results found for this basename: CKTOTAL:4,CKMB:4,TROPONINI:4 in the last 72 hours  Coagulation Studies:  Basename 12/10/11 0435 2012-01-04 0550  LABPROT 18.2* 13.5  INR 1.48 1.01     Tele:  NSR  Medications:    Infusions:    . sodium chloride 30 mL/hr (01-04-2012 0841)  . heparin 20 Units/kg/hr (12/08/11 1125)    Scheduled Medications:    . antiseptic oral rinse  15 mL Mouth Rinse QID  . aspirin EC  81 mg Oral Daily  . atorvastatin  80 mg Oral q1800  . chlorhexidine  15 mL Mouth Rinse BID  . levothyroxine  76 mcg Intravenous Daily  . metoCLOPramide  5 mg Oral TID AC & HS  . metoprolol tartrate  25 mg Oral BID  . pantoprazole (PROTONIX) IV  40 mg Intravenous QHS  . patient's guide to using  coumadin book   Does not apply Once  . warfarin  10 mg Oral ONCE-1800  . warfarin   Does not apply Once  . Warfarin - Pharmacist Dosing Inpatient   Does not apply q1800  . DISCONTD: furosemide  40 mg Intravenous BID  . DISCONTD: HYDROmorphone PCA 0.3 mg/mL   Intravenous Q4H  . DISCONTD: metoprolol tartrate  50 mg Oral BID    Assessment/ Plan:    AAA (abdominal aortic aneurysm) (12/06/2011) Progressing slowly .  CKD (12/06/2011) His creatinine has improved overnight.    Coronary artery disease) (12/06/2011) I would agree that cath would be risky at this point.  He seems to be progressing and has no signs of CHF - no angina.  Continue current plans  Volume overload: Has been diuresing on Lasix  HTN (hypertension) (12/06/2011) Continue Metoprolol and Lasix  for now.     Length of Stay:  5  Thayer Headings, Brooke Bonito., MD, [facility] 12/10/2011, 8:09 AM Office [phone] Pager [phone]

## 2011-12-10 NOTE — Progress Notes (Signed)
ANTICOAGULATION CONSULT NOTE - Follow-Up Consult  Pharmacy Consult for Heparin per MD, Coumadin per Rx Indication: s/p embolectomy, thrombectomy with AAA repair  No Known Allergies  Patient Measurements: Height: 6' (182.9 cm) Weight: 216 lb 14.9 oz (98.4 kg) IBW/kg (Calculated) : 77.6   Labs:  Basename 12/10/11 0435 12/09/11 0839 12/09/11 0550 12/08/11 0510  HGB 7.9* -- 9.2* --  HCT 23.8* -- 27.9* 27.2*  PLT 105* -- 115* 81*  APTT -- -- -- --  LABPROT 18.2* -- 13.5 --  INR 1.48 -- 1.01 --  HEPARINUNFRC 0.25* 0.42 -- 0.18*  CREATININE 2.44* -- 2.61* 2.41*  CKTOTAL -- -- -- --  CKMB -- -- -- --  TROPONINI -- -- -- --    Estimated Creatinine Clearance: 28.8 ml/min (by C-G formula based on Cr of 2.44).   Medical History: Past Medical History  Diagnosis Date  . AAA (abdominal aortic aneurysm)   . GERD (gastroesophageal reflux disease)   . Arthritis   . Hypertension   . Chronic kidney disease     Renal insufficiency  . Coronary artery disease   . Hemorrhoid     Assessment: 78 yo male s/p AAA who underwent resection and grafting of an infrarenal AAA with insertion of a common iliac graft and exploration of R popliteal artery with embolectomy of posterior tibial and anterior tibial arteries with intraoperative arteriogram of R leg on 12/05/11. 6 hours after the case he lost pulses in the R foot so he was taken back to the OR for thrombectomy of the anterior tibial, posterior tibial, and peroneal arteries.  INR = 1.48 after two doses of 10 mg of Coumadin. Likely have not seen complete effects of both 10 mg doses. Warf score of 6. Small Hgb drop (7.9) from yesterday. Low plts but overall trending up (105 from 77). No bleeding noted per RN.   Goal of Therapy:  INR 2-3 Monitor platelets by anticoagulation protocol: Yes   Plan:  1) Coumadin 7.5 mg po x 1 dose tonight 2) Daily INR, follow up CBC 3) Continue to follow heparin dosing per MD   Thank you for the  consult.  Mahala Menghini, PharmD Clinical Pharmacist Pgr [phone] 12/10/2011,8:32 AM

## 2011-12-10 NOTE — Progress Notes (Signed)
10 cc of PCA Hydromorphone wasted in sink. Witnessed by Hurshel Party RN.

## 2011-12-10 NOTE — Progress Notes (Signed)
OT Cancellation Note  Treatment cancelled today due to patient's refusal to participate due to just receiving pain meds.  OT will re check on pt as schedule allows.  Betsy Pries 12/10/2011, 12:50 PM

## 2011-12-10 NOTE — Progress Notes (Addendum)
[facility]  Post-op  Intra-abdominal Surgery note  Date of Surgery: 12/05/2011 - 12/06/2011 Surgeon: Juliann Mule): Mal Misty, MD POD: 5 Days Post-Op Procedure(s): AAA repair EMBOLECTOMY - right tibial vessels ANGIOGRAM EXTREMITY RIGHT  History of Present Illness  GUSTABO BECH is a 78 y.o. male who is  5 days post-op. He was confused yesterday per RN but better today. denies incisional pain; denies nausea/vomiting; denies diarrhea. has not had flatus;has not had BM  IMAGING: Dg Chest Port 1 View  12/08/2011  *RADIOLOGY REPORT*  Clinical Data: Chest pain.  PORTABLE CHEST - 1 VIEW  Comparison: NG tube and right IJ approach Swan-Ganz catheter have been removed.  Right IJ sheath remains in place.  Findings: The patient has small bilateral pleural effusions and basilar atelectasis, more prominent than left.  The patient's left effusion appears decreased.  No pneumothorax identified.  Lung volumes are low.  Heart size appears enlarged.  IMPRESSION:  1.  Status post NG tube and Swan-Ganz catheter removal with a right IJ sheath in place. 2.  Small bilateral pleural effusions and basilar atelectasis, greater on the left.  Left basilar aeration appears slightly improved.  Original Report Authenticated By: Arvid Right. Luther Parody, M.D.    Significant Diagnostic Studies: CBC Lab Results  Component Value Date   WBC 8.4 12/10/2011   HGB 7.9* 12/10/2011   HCT 23.8* 12/10/2011   MCV 95.2 12/10/2011   PLT 105* 12/10/2011    BMET    Component Value Date/Time   NA 141 12/10/2011 0435   K 3.7 12/10/2011 0435   CL 107 12/10/2011 0435   CO2 25 12/10/2011 0435   GLUCOSE 95 12/10/2011 0435   BUN 43* 12/10/2011 0435   CREATININE 2.44* 12/10/2011 0435   CALCIUM 8.7 12/10/2011 0435   GFRNONAA 23* 12/10/2011 0435   GFRAA 27* 12/10/2011 0435    COAG Lab Results  Component Value Date   INR 1.48 12/10/2011   INR 1.01 12/09/2011   INR 1.21 12/05/2011   No results found for this  basename: PTT    I/O last 3 completed shifts: In: 2349.5 [P.O.:720; I.V.:1629.5] Out: 4835 [Urine:4835]    Physical Examination BP Readings from Last 3 Encounters:  12/10/11 141/49  12/10/11 141/49  12/10/11 141/49   Temp Readings from Last 3 Encounters:  12/10/11 97.8 F (36.6 C) Oral  12/10/11 97.8 F (36.6 C) Oral  12/10/11 97.8 F (36.6 C) Oral   SpO2 Readings from Last 3 Encounters:  12/10/11 90%  12/10/11 90%  12/10/11 90%   Pulse Readings from Last 3 Encounters:  12/10/11 76  12/10/11 76  12/10/11 76    General: A&O x 3, WDWN male in NAD Pulmonary: normal non-labored breathing , with Rales at bases, no rhonchi, no wheezing Cardiac: Heart rate : regular ,  Abdomen:abdomen soft, non-tender and few BS Abdominal wound:clean, dry, intact  Neurologic: A&O X 3; Appropriate Affect ;  SENSATION: normal left foot improved in right; MOTOR FUNCTION:  moving all extremities equally. Speech is fluent/normal Vascular Exam:BLE warm and well perfused except fot great toe still cold Mottling improved  right foot much warmer Some edema bilat. Extremities right foot mottling improved with foot much warmer, nontender, no Gangrene, no cellulitis; no open wounds;   LOWER EXTREMITY PULSES           RIGHT  LEFT      POSTERIOR TIBIAL  palpable  monophasic by Doppler       DORSALIS PEDIS      ANTERIOR TIBIAL  monophasic by Doppler  palpable   Assessment/Plan: SAAFIR WIESEMANN is a 78 y.o. male who is 5 Days Post-Op Pt awake , alert Mild confusion - will stop PCA Post-op acute blood loss anemia/ heparin/coumadin - T&S - may transfuse today PLT count stable - HIT panel pending BUN high with stable creatinine after diuresis  Right foot still mottled but warmer, sensation better and nontender ? change to lovenox and coumadin Continue foley for strict I/O Will keep in ICU today - poss transfer out in am if stable Repeat labs in am  Richrd Prime [phone] 12/10/2011 7:48 AM      Agree with above Abdomen relatively soft not distended with no tenderness No bowel movement yet Right lower extremity continues to improve with 2-3+ posterior tibial pulse palpable and Triphasic Doppler flow right posterior tib with audible Doppler flow in DP and peroneal. Distal foot remains mottled with some blistering the first toe Motion present and toes with some sensation 1-2+ edema right leg 1+ edema left leg  Continues on 2000 units of heparin per hour with heparin level 0.29 Started on Coumadin and INR now 1.5 Hematocrit 23.8 we'll need to watch this closely-does not appear to be having bleeding intra-abdominally  Plan increase to soft diet as tolerated-begin mobilization-probably transfer 2000 and a.m. today she will watch hematocrit and clotting studies closely If INR approaches 2.0 May DC heparin in the a.m.

## 2011-12-10 NOTE — Progress Notes (Signed)
Physical Therapy Treatment Patient Details Name: [name] MRN: [id number] DOB: Mar 30, 1929 Today's Date: 12/10/2011 Time: [id number] PT Time Calculation (min): 31 min  PT Assessment / Plan / Recommendation Comments on Treatment Session  Pt mobility improving. Continues to require mod-max assist with most mobility.      Follow Up Recommendations  Home health PT;Supervision/Assistance - 24 hour    Barriers to Discharge        Equipment Recommendations  Rolling walker with 5" wheels;3 in 1 bedside comode    Recommendations for Other Services    Frequency Min 3X/week   Plan Discharge plan remains appropriate;Frequency remains appropriate    Precautions / Restrictions Precautions Precautions: Fall Restrictions Weight Bearing Restrictions: Yes   Pertinent Vitals/Pain Pt c/o pain in R foot/LE but unable to rate it.  Pain only present with activity.  Pt did not require intervention for pain relief.     Mobility  Bed Mobility Bed Mobility: Sit to Supine Sit to Supine: 4: Min assist Details for Bed Mobility Assistance: Min assist for R LE secondary to pain.  Transfers Transfers: Sit to Stand;Stand to Sit;Stand Pivot Transfers Sit to Stand: 3: Mod assist;From chair/3-in-1;With upper extremity assist Sit to Stand: Patient Percentage: 70% Stand to Sit: 3: Mod assist;With upper extremity assist;To bed;To elevated surface Stand to Sit: Patient Percentage: 70% Stand Pivot Transfers: 2: Max assist Stand Pivot Transfers: Patient Percentage: 40% Details for Transfer Assistance: Pt able to tolerate PWB on RLE.  Pt required assistance to manage his body weight secondary to generalized weakness.  Pt required assistance to manage RW.  Improved ability to advance L LE with PWB on R LE.   Ambulation/Gait Ambulation/Gait Assistance: Not tested (comment)    Exercises     PT Diagnosis:    PT Problem List:   PT Treatment Interventions:     PT Goals Acute Rehab PT Goals PT Goal Formulation:  With patient Time For Goal Achievement: 12/13/11 Potential to Achieve Goals: Good Pt will go Supine/Side to Sit: with modified independence PT Goal: Supine/Side to Sit - Progress: Progressing toward goal Pt will go Sit to Supine/Side: with modified independence PT Goal: Sit to Supine/Side - Progress: Progressing toward goal Pt will go Sit to Stand: with supervision PT Goal: Sit to Stand - Progress: Progressing toward goal Pt will go Stand to Sit: with supervision PT Goal: Stand to Sit - Progress: Progressing toward goal Pt will Ambulate: >150 feet;with supervision;with least restrictive assistive device PT Goal: Ambulate - Progress: Not met Pt will Go Up / Down Stairs: 1-2 stairs;with min assist;with least restrictive assistive device PT Goal: Up/Down Stairs - Progress: Not met  Visit Information  Last PT Received On: 12/10/11 Assistance Needed: +2    Subjective Data  Subjective: My foot feels like a bee stinging it.     Cognition  Overall Cognitive Status: Appears within functional limits for tasks assessed/performed Arousal/Alertness: Awake/alert Orientation Level: Appears intact for tasks assessed Behavior During Session: [facility] for tasks performed    Balance  Static Standing Balance Static Standing - Balance Support: Bilateral upper extremity supported Static Standing - Level of Assistance: 5: Stand by assistance  End of Session PT - End of Session Equipment Utilized During Treatment: Gait belt;Oxygen Activity Tolerance: Patient tolerated treatment well Patient left: in bed;with call bell/phone within reach;with nursing in room Nurse Communication: Mobility status   GP     [name] 12/10/2011, 5:31 PM

## 2011-12-11 LAB — CBC
MCH: 31.7 pg (ref 26.0–34.0)
MCV: 93.2 fL (ref 78.0–100.0)
Platelets: 119 10*3/uL — ABNORMAL LOW (ref 150–400)
RBC: 2.49 MIL/uL — ABNORMAL LOW (ref 4.22–5.81)

## 2011-12-11 LAB — BASIC METABOLIC PANEL
CO2: 25 mEq/L (ref 19–32)
Calcium: 8.5 mg/dL (ref 8.4–10.5)
Chloride: 105 mEq/L (ref 96–112)
Glucose, Bld: 116 mg/dL — ABNORMAL HIGH (ref 70–99)
Potassium: 3.6 mEq/L (ref 3.5–5.1)
Sodium: 139 mEq/L (ref 135–145)

## 2011-12-11 MED ORDER — FLEET ENEMA 7-19 GM/118ML RE ENEM
1.0000 | ENEMA | Freq: Once | RECTAL | Status: AC
  Administered 2011-12-11: 12:00:00 via RECTAL
  Filled 2011-12-11: qty 1

## 2011-12-11 MED ORDER — PANTOPRAZOLE SODIUM 40 MG PO TBEC
40.0000 mg | DELAYED_RELEASE_TABLET | Freq: Every day | ORAL | Status: DC
  Administered 2011-12-11 – 2011-12-13 (×3): 40 mg via ORAL
  Filled 2011-12-11 (×3): qty 1

## 2011-12-11 MED ORDER — LEVOTHYROXINE SODIUM 150 MCG PO TABS
150.0000 ug | ORAL_TABLET | Freq: Every day | ORAL | Status: DC
  Administered 2011-12-11 – 2011-12-13 (×3): 150 ug via ORAL
  Filled 2011-12-11 (×4): qty 1

## 2011-12-11 MED ORDER — WARFARIN SODIUM 4 MG PO TABS
4.0000 mg | ORAL_TABLET | Freq: Once | ORAL | Status: AC
  Administered 2011-12-11: 4 mg via ORAL
  Filled 2011-12-11: qty 1

## 2011-12-11 NOTE — Progress Notes (Signed)
Occupational Therapy Treatment Patient Details Name: [name] MRN: [id number] DOB: January 14, 1929 Today's Date: 12/11/2011 Time: [id number] OT Time Calculation (min): 17 min  OT Assessment / Plan / Recommendation Comments on Treatment Session  Pt adm for AAA repair. Post-op required thrombectomy of RLE and now with ischemic foot.      Follow Up Recommendations  Inpatient Rehab       Equipment Recommendations  3 in 1 bedside comode    Recommendations for Other Services Rehab consult  Frequency Min 2X/week   Plan Discharge plan needs to be updated    Precautions / Restrictions Precautions Precautions: Fall Restrictions Weight Bearing Restrictions: No Other Position/Activity Restrictions: Cleared to ambulate per PA note today       ADL  Toilet Transfer: Other (comment);Moderate assistance;Performed (bed to [facility]) Toilet Transfer Method: Stand pivot;Other (comment) (bed to chair) ADL Comments: Pt getting ready to transfers rooms. Pt in good spirits and willing to take a few extra steps to WC.      OT Goals ADL Goals ADL Goal: Toilet Transfer - Progress: Progressing toward goals  Visit Information  Last OT Received On: 12/11/11 Assistance Needed: +2    Subjective Data      Prior Functioning       Cognition  Overall Cognitive Status: Appears within functional limits for tasks assessed/performed    Mobility Bed Mobility Sit to Supine: 6: Modified independent (Device/Increase time);HOB flat;With rail Details for Bed Mobility Assistance: extra time and effort needed to get into the bed.  Difficulty lifting bil R>L leg over the side of the bed.   Transfers Transfers: Sit to Stand;Stand to Sit Sit to Stand: 3: Mod assist;From bed;With upper extremity assist Sit to Stand: Patient Percentage: 70% Stand to Sit: To chair/3-in-1;With armrests;With upper extremity assist Stand to Sit: Patient Percentage: 70% Details for Transfer Assistance: assist needed to support trunk to  prevent posterior LOB, assist needed to maneuver RW verbal and tactile cues for upright posture.     Exercises Total Joint Exercises Quad Sets: AROM;Both;10 reps;Supine Heel Slides: AROM;Right;10 reps;Supine General Exercises - Lower Extremity Ankle Circles/Pumps: AROM;AAROM;Both;20 reps;Supine Heel Slides: AROM;Right;10 reps Straight Leg Raises: AROM;Right;10 reps;Supine  Balance    End of Session OT - End of Session Activity Tolerance: Patient tolerated treatment well Patient left: in chair  GO     Holland, Thereasa Parkin 12/11/2011, 12:57 PM

## 2011-12-11 NOTE — Progress Notes (Signed)
    Subjective:  No chest pain or dyspnea at rest. Some dyspnea with exertion.  Objective:  Vital Signs in the last 24 hours: Temp:  [97.5 F (36.4 C)-98.7 F (37.1 C)] 98.3 F (36.8 C) (08/20 0819) Pulse Rate:  [59-88] 81  (08/20 0700) Resp:  [16-21] 17  (08/20 0700) BP: (110-149)/(26-94) 149/61 mmHg (08/20 0700) SpO2:  [84 %-99 %] 86 % (08/20 0901) Weight:  [96.9 kg (213 lb 10 oz)] 96.9 kg (213 lb 10 oz) (08/20 0700)  Intake/Output from previous day: 08/19 0701 - 08/20 0700 In: 1410 [P.O.:300; I.V.:1100; IV Piggyback:10] Out: 2960 [Urine:2960]  Physical Exam: Pt is alert and oriented, NAD HEENT: normal Neck: JVP - normal, carotids 2+= without bruits Lungs: CTA bilaterally CV: RRR without murmur or gallop Abd: soft, NT, Positive BS, no hepatomegaly Ext: right foot with cyanotic toes, cool. Distal pulses doppler.  Lab Results:  Basename 12/11/11 0429 12/10/11 0435  WBC 7.8 8.4  HGB 7.9* 7.9*  PLT 119* 105*    Basename 12/11/11 0429 12/10/11 0435  NA 139 141  K 3.6 3.7  CL 105 107  CO2 25 25  GLUCOSE 116* 95  BUN 36* 43*  CREATININE 2.12* 2.44*   No results found for this basename: TROPONINI:2,CK,MB:2 in the last 72 hours  Tele: sinus rhythm  Assessment/Plan:  1. Elevated cardiac enzymes 2. AAA s/p open repair 3. Post-op volume overload 4. Acute on chronic kidney disease  Pt stable. Creatinine improved. No further cardiac workup planned. Further management per vascular surgery. Please call if I can be of any further assistance. thx  Sherren Mocha, M.D. 12/11/2011, 10:55 AM

## 2011-12-11 NOTE — Progress Notes (Addendum)
[facility] [place]  Post-op  Intra-abdominal Surgery note  Date of Surgery: 12/05/2011 - 12/06/2011 Surgeon: Juliann Mule): Mal Misty, MD POD: 6 Days Post-Op  History of Present Illness  [name] is a 78 y.o. male who is 6 days post AAA repair. Pt is doing well. slept well.C/O pain in right foot which is unchanged. denies incisional pain; denies nausea/vomiting; denies diarrhea. has had flatus;has not had BM  IMAGING: No results found.  Significant Diagnostic Studies: CBC Lab Results  Component Value Date   WBC 7.8 12/11/2011   HGB 7.9* 12/11/2011   HCT 23.2* 12/11/2011   MCV 93.2 12/11/2011   PLT 119* 12/11/2011    BMET    Component Value Date/Time   NA 139 12/11/2011 0429   K 3.6 12/11/2011 0429   CL 105 12/11/2011 0429   CO2 25 12/11/2011 0429   GLUCOSE 116* 12/11/2011 0429   BUN 36* 12/11/2011 0429   CREATININE 2.12* 12/11/2011 0429   CALCIUM 8.5 12/11/2011 0429   GFRNONAA 28* 12/11/2011 0429   GFRAA 32* 12/11/2011 0429    COAG Lab Results  Component Value Date   INR 2.26* 12/11/2011   INR 1.48 12/10/2011   INR 1.01 12/09/2011   No results found for this basename: PTT    I/O last 3 completed shifts: In: 2250 [P.O.:540; I.V.:1700; IV Piggyback:10] Out: [id number] [Urine:4010]    Physical Examination BP Readings from Last 3 Encounters:  12/11/11 149/61  12/11/11 149/61  12/11/11 149/61   Temp Readings from Last 3 Encounters:  12/11/11 97.5 F (36.4 C) Oral  12/11/11 97.5 F (36.4 C) Oral  12/11/11 97.5 F (36.4 C) Oral   SpO2 Readings from Last 3 Encounters:  12/11/11 96%  12/11/11 96%  12/11/11 96%   Pulse Readings from Last 3 Encounters:  12/11/11 81  12/11/11 81  12/11/11 81    General: A&O x 3, WDWN male in NAD Pulmonary: normal non-labored breathing , without Rales, rhonchi,  wheezing Cardiac: Heart rate : regular ,  Abdomen:abdomen soft and non-tender Abdominal wound:clean, dry, intact  Neurologic: A&O X 3; Appropriate  Affect ; SENSATION: normal; MOTOR FUNCTION:  moving all extremities equally. Speech is fluent/normal  Vascular Exam:BLE warm and well perfused Extremities without ischemic changes, no Gangrene, no cellulitis; no open wounds;   LOWER EXTREMITY PULSES           RIGHT                                      LEFT      POSTERIOR TIBIAL  palpable  palpable       DORSALIS PEDIS      ANTERIOR TIBIAL  monophasic by Doppler   palpable   Assessment/Plan: [name] is a 78 y.o. male who is 6 Days Post-Op AAA repair and right tibial embolectomy x 2  Pt doing well Asymptomatic post -op anemia stable @ 7.9/23 Cr down to 2.14 PT/INR 2.26 Dc heparin DC foley and right IJ sleeve Advance diet Ambulate Transfer to 2000    [name],[name] [id number] 12/11/2011 7:42 AM      Agree with above Right foot with triphasic posterior tibial Doppler flow and palpable PT pulse at 3+. Positive Doppler flow in DP and peroneal Ischemic changes in the distal right foot unchanged with some blistering. He does have motion in toes and some sensation. Needs to work on  range of motion right ankle to prevent foot drop. Left leg with 3+ dorsalis pedis pulse palpable. Incisions all healing well.  We'll transfer 2000 and now that heparin has been discontinued can increase attempted ambulation with physical therapy in preparation for home physical therapy post discharge Coumadin being regulated with INR currently 2.5  Making slow progress. Final outcome of the distal right foot will take several weeks to determine most likely. Would like to discharge patient home by end of week if tolerating physical therapy and pain is tolerable

## 2011-12-11 NOTE — Progress Notes (Signed)
ANTICOAGULATION CONSULT NOTE - Aten for Coumadin per Rx Indication: s/p embolectomy, thrombectomy with AAA repair  No Known Allergies  Patient Measurements: Height: 6' (182.9 cm) Weight: 213 lb 10 oz (96.9 kg) IBW/kg (Calculated) : 77.6   Labs:  Basename 12/11/11 0429 12/10/11 0435 12/09/11 0839 12/09/11 0550  HGB 7.9* 7.9* -- --  HCT 23.2* 23.8* -- 27.9*  PLT 119* 105* -- 115*  APTT -- -- -- --  LABPROT 25.3* 18.2* -- 13.5  INR 2.26* 1.48 -- 1.01  HEPARINUNFRC 0.38 0.25* 0.42 --  CREATININE 2.12* 2.44* -- 2.61*  CKTOTAL -- -- -- --  CKMB -- -- -- --  TROPONINI -- -- -- --    Estimated Creatinine Clearance: 33 ml/min (by C-G formula based on Cr of 2.12).   Medical History: Past Medical History  Diagnosis Date  . AAA (abdominal aortic aneurysm)   . GERD (gastroesophageal reflux disease)   . Arthritis   . Hypertension   . Chronic kidney disease     Renal insufficiency  . Coronary artery disease   . Hemorrhoid     Assessment: 78 yo male s/p AAA who underwent resection and grafting of an infrarenal AAA with insertion of a common iliac graft and exploration of R popliteal artery with embolectomy of posterior tibial and anterior tibial arteries with intraoperative arteriogram of R leg on 12/05/11. 6 hours after the case he lost pulses in the R foot so he was taken back to the OR for thrombectomy of the anterior tibial, posterior tibial, and peroneal arteries.  INR = 2.26 is therapeutic after two doses of 10 mg of Coumadin and one dose of 7.5 mg. Likely have not seen complete effects of all three doses and with large INR increase since yesterday, will give lower dose today. Warf score of 6. Hgb (7.9) stable from yesterday. Low plts but overall trending up (119 from 77). No bleeding noted per RN. Heparin d/c noted.   Goal of Therapy:  INR 2-3 Monitor platelets by anticoagulation protocol: Yes   Plan:  1) Coumadin 4 mg po x 1 dose tonight 2)  Daily INR, follow up CBC    Thank you for the consult.  Mahala Menghini, PharmD Clinical Pharmacist Pgr [phone] 12/11/2011,8:35 AM

## 2011-12-11 NOTE — Progress Notes (Signed)
Physical Therapy Treatment Patient Details Name: [name] MRN: [id number] DOB: 09/03/1928 Today's Date: 12/11/2011 Time: 0900-0919 PT Time Calculation (min): 19 min  PT Assessment / Plan / Recommendation Comments on Treatment Session  Pt adm for AAA repair. Post-op required thrombectomy of RLE and now with ischemic foot.    Follow Up Recommendations  Inpatient Rehab    Barriers to Discharge        Equipment Recommendations  Rolling walker with 5" wheels;3 in 1 bedside comode    Recommendations for Other Services Rehab consult  Frequency Min 3X/week   Plan Discharge plan needs to be updated;Frequency remains appropriate    Precautions / Restrictions Precautions Precautions: Fall Restrictions Weight Bearing Restrictions: No Other Position/Activity Restrictions: Cleared to ambulate per PA note today   Pertinent Vitals/Pain Reports 7/10 right foot pain with gait.  RN gave pain meds after walk.  O2 sats dropped to 86% on RA, likely due to pt holding his breath as he walked.  Verbal cues for pursed lip breathing with mobility.      Mobility  Bed Mobility Sit to Supine: 6: Modified independent (Device/Increase time);HOB flat;With rail Details for Bed Mobility Assistance: extra time and effort needed to get into the bed.  Difficulty lifting bil R>L leg over the side of the bed.   Transfers Sit to Stand: 1: +2 Total assist;With upper extremity assist;With armrests;From chair/3-in-1 Sit to Stand: Patient Percentage: 70% Stand to Sit: 1: +2 Total assist;With armrests;To chair/3-in-1;To bed;With upper extremity assist Stand to Sit: Patient Percentage: 70% Stand Pivot Transfers: 1: +2 Total assist;With armrests Stand Pivot Transfers: Patient Percentage: 70% Details for Transfer Assistance: assist needed to support trunk to prevent posterior LOB, assist needed to maneuver RW verbal and tactile cues for upright posture.   Ambulation/Gait Ambulation/Gait Assistance: 3: Mod  assist Ambulation Distance (Feet): 40 Feet Assistive device: Rolling walker Ambulation/Gait Assistance Details: patient with flexed trunk, holding breath while walking, when cued to stand up straight he tips backwards.   Gait Pattern: Step-to pattern;Antalgic;Trunk flexed    Exercises Total Joint Exercises Quad Sets: AROM;Both;10 reps;Supine Heel Slides: AROM;Right;10 reps;Supine General Exercises - Lower Extremity Ankle Circles/Pumps: AROM;AAROM;Both;20 reps;Supine Heel Slides: AROM;Right;10 reps Straight Leg Raises: AROM;Right;10 reps;Supine   PT Diagnosis:    PT Problem List:   PT Treatment Interventions:     PT Goals Acute Rehab PT Goals PT Goal: Sit to Supine/Side - Progress: Progressing toward goal PT Goal: Sit to Stand - Progress: Progressing toward goal PT Goal: Stand to Sit - Progress: Progressing toward goal PT Goal: Ambulate - Progress: Progressing toward goal  Visit Information  Last PT Received On: 12/11/11 Assistance Needed: +2    Subjective Data  Subjective: "My foot hurts to walk"   Cognition  Overall Cognitive Status: Appears within functional limits for tasks assessed/performed    Balance     End of Session PT - End of Session Equipment Utilized During Treatment: Gait belt;Back brace Activity Tolerance: Patient limited by fatigue;Patient limited by pain Patient left: in bed;with call bell/phone within reach   GP     Ludia Gartland B. Whitehouse, [place], DPT [phone]   12/11/2011, 10:42 AM

## 2011-12-12 DIAGNOSIS — R5381 Other malaise: Secondary | ICD-10-CM

## 2011-12-12 LAB — CBC
Hemoglobin: 8 g/dL — ABNORMAL LOW (ref 13.0–17.0)
RBC: 2.49 MIL/uL — ABNORMAL LOW (ref 4.22–5.81)
WBC: 8.1 10*3/uL (ref 4.0–10.5)

## 2011-12-12 LAB — PROTIME-INR
INR: 2.31 — ABNORMAL HIGH (ref 0.00–1.49)
Prothrombin Time: 25.8 seconds — ABNORMAL HIGH (ref 11.6–15.2)

## 2011-12-12 LAB — CREATININE, SERUM
GFR calc Af Amer: 31 mL/min — ABNORMAL LOW (ref >90)
GFR calc non Af Amer: 27 mL/min — ABNORMAL LOW (ref >90)

## 2011-12-12 LAB — HEPARIN LEVEL (UNFRACTIONATED): Heparin Unfractionated: <0.1 IU/mL — ABNORMAL LOW (ref 0.30–0.70)

## 2011-12-12 MED ORDER — WARFARIN SODIUM 4 MG PO TABS
4.0000 mg | ORAL_TABLET | Freq: Once | ORAL | Status: AC
  Administered 2011-12-12: 4 mg via ORAL
  Filled 2011-12-12: qty 1

## 2011-12-12 NOTE — PMR Pre-admission (Signed)
PMR Admission Coordinator Pre-Admission Assessment  Patient: [name] is an 78 y.o., male MRN: [id number] DOB: Jun 19, 1928 Height: 6' (182.9 cm) Weight: 96.9 kg (213 lb 10 oz)  Insurance Information HMO: yes    PPO:      PCP:      IPA:      80/20:      OTHER:  PRIMARY: Blue Medicare      Policy#: [id number]      Subscriber: pt CM Name: Irene Shipper      Phone#: [phone]     Fax#: [phone] Pre-Cert#: [id number]      Employer: retired Benefits:  Phone #: [phone]     Name: 12/13/11 Eff. Date: 1/1/3 active     Deduct: none      Out of Pocket Max: $2500      Life Max: none CIR: $100 per day days 1 through 6      SNF: 100 days per period, no copay days 1 thru 10, $50 per day days 11 -100 Outpatient: $20 per visit     Co-Pay: no visit limit Home Health: 100      Co-Pay: no visit limit DME: 95%     Co-Pay: 5% Providers: in network  SECONDARY: [employer]      Policy#: [id number]      Subscriber: pt  No auth. Covers room  Emergency Contact Information Contact Information    Name Relation Home Work Mobile   Gashi,Dorothy Spouse [phone]       Current Medical History  Patient Admitting Diagnosis: deconditioning after AAA repair and right tibial and peroneal thrombectomy  History of Present Illness: [name] is a 78 y.o. male with HTN, GERD, and AAA who underwent resection and grafting of an infrarenal AAA with insertion of a common iliac graft and exploration of R popliteal artery with embolectomy of posterior tibial and anterior tibial arteries with intraoperative arteriogram of R leg on 12/05/11. 6 hours after the case he lost pulses in the R foot so he was taken back to the OR for thrombectomy of the anterior tibial, posterior tibial, and peroneal arteries. Post op with EKG changes with mild elevation in cardiac enzymes likely multifactorial per cardiology. Fluid overload treated with diuretics.   Past Medical History  Past Medical History  Diagnosis Date  . AAA  (abdominal aortic aneurysm)   . GERD (gastroesophageal reflux disease)   . Arthritis   . Hypertension   . Chronic kidney disease     Renal insufficiency  . Coronary artery disease   . Hemorrhoid     Family History  family history includes Coronary artery disease in his mother; Heart attack in his mother; Hypertension in his father; Kidney disease in his mother; and Stroke in his father.  Prior Rehab/Hospitalizations: none  Current Medications  Current facility-administered medications:acetaminophen (TYLENOL) suppository 325-650 mg, 325-650 mg, Rectal, Q4H PRN, Richrd Prime, PA;  acetaminophen (TYLENOL) tablet 325-650 mg, 325-650 mg, Oral, Q4H PRN, Richrd Prime, PA;  alum & mag hydroxide-simeth (MAALOX/MYLANTA) 200-200-20 MG/5ML suspension 15-30 mL, 15-30 mL, Oral, Q2H PRN, Richrd Prime, PA antiseptic oral rinse (BIOTENE) solution 15 mL, 15 mL, Mouth Rinse, QID, Jyl Heinz, MD, 15 mL at 12/13/11 1200;  aspirin EC tablet 81 mg, 81 mg, Oral, Daily, Mal Misty, MD, 81 mg at 12/13/11 1050;  atorvastatin (LIPITOR) tablet 80 mg, 80 mg, Oral, q1800, Jolaine Artist, MD, 80 mg at 12/12/11 1732;  chlorhexidine (PERIDEX) 0.12 % solution  15 mL, 15 mL, Mouth Rinse, BID, Mal Misty, MD, 15 mL at 12/12/11 2035 diphenhydrAMINE (BENADRYL) 12.5 MG/5ML elixir 12.5 mg, 12.5 mg, Oral, Q6H PRN, Nancy Nordmann [name], PA;  diphenhydrAMINE (BENADRYL) injection 12.5 mg, 12.5 mg, Intravenous, Q6H PRN, Richrd Prime, PA;  guaiFENesin-dextromethorphan (ROBITUSSIN DM) 100-10 MG/5ML syrup 15 mL, 15 mL, Oral, Q4H PRN, Richrd Prime, PA;  HYDROmorphone (DILAUDID) injection 0.5 mg, 0.5 mg, Intravenous, Q4H PRN, Richrd Prime, PA, 0.5 mg at 12/11/11 1142 levothyroxine (SYNTHROID, LEVOTHROID) tablet 150 mcg, 150 mcg, Oral, QAC breakfast, Nancy Nordmann [place], [place], 150 mcg at 12/13/11 0743;  metoCLOPramide (REGLAN) tablet 5 mg, 5 mg, Oral, TID AC & HS, Nancy Nordmann [place], [place], 5 mg at 12/13/11 1157;   metoprolol tartrate (LOPRESSOR) tablet 25 mg, 25 mg, Oral, BID, Jolaine Artist, MD, 25 mg at 12/13/11 1051;  ondansetron (ZOFRAN) injection 4 mg, 4 mg, Intravenous, Q6H PRN, Richrd Prime, PA oxyCODONE (Oxy IR/ROXICODONE) immediate release tablet 5 mg, 5 mg, Oral, Q4H PRN, Richrd Prime, PA, 5 mg at 12/12/11 1731;  pantoprazole (PROTONIX) EC tablet 40 mg, 40 mg, Oral, Q1200, Mal Misty, MD, 40 mg at 12/13/11 1157;  patient's guide to using coumadin book, , Does not apply, Once, Mal Misty, MD;  patient's guide to using coumadin book, , Does not apply, Once, Richrd Prime, PA phenol (CHLORASEPTIC) mouth spray 1 spray, 1 spray, Mouth/Throat, PRN, Nancy Nordmann [name], PA;  sodium chloride 0.9 % injection 10 mL, 10 mL, Intravenous, PRN, Mal Misty, MD, 10 mL at 12/12/11 2035;  sodium chloride 0.9 % injection 10 mL, 10 mL, Intravenous, PRN, Richrd Prime, PA;  warfarin (COUMADIN) tablet 2 mg, 2 mg, Oral, ONCE-1800, Anh P Pham, PHARMD warfarin (COUMADIN) tablet 4 mg, 4 mg, Oral, ONCE-1800, Anh P Pham, PHARMD, 4 mg at 12/12/11 1732;  warfarin (COUMADIN) video, , Does not apply, Once, Mal Misty, MD;  Warfarin - Pharmacist Dosing Inpatient, , Does not apply, [id number], Mal Misty, MD;  DISCONTD: acetaminophen (TYLENOL) suppository 325-650 mg, 325-650 mg, Rectal, Q4H PRN, Richrd Prime, PA DISCONTD: acetaminophen (TYLENOL) tablet 325-650 mg, 325-650 mg, Oral, Q4H PRN, Richrd Prime, PA;  DISCONTD: alum & mag hydroxide-simeth (MAALOX/MYLANTA) 200-200-20 MG/5ML suspension 15-30 mL, 15-30 mL, Oral, Q2H PRN, Richrd Prime, [place];  DISCONTD: antiseptic oral rinse (BIOTENE) solution 15 mL, 15 mL, Mouth Rinse, QID, Richrd Prime, [place];  DISCONTD: aspirin EC tablet 81 mg, 81 mg, Oral, Daily, Nancy Nordmann [place], [place] DISCONTD: atorvastatin (LIPITOR) tablet 80 mg, 80 mg, Oral, q1800, Richrd Prime, [place];  DISCONTD: diphenhydrAMINE (BENADRYL) 12.5 MG/5ML elixir 12.5 mg, 12.5 mg, Oral,  Q6H PRN, Richrd Prime, PA;  DISCONTD: diphenhydrAMINE (BENADRYL) injection 12.5 mg, 12.5 mg, Intravenous, Q6H PRN, Richrd Prime, PA;  DISCONTD: guaiFENesin-dextromethorphan (ROBITUSSIN DM) 100-10 MG/5ML syrup 15 mL, 15 mL, Oral, Q4H PRN, Richrd Prime, PA DISCONTD: HYDROmorphone (DILAUDID) injection 0.5 mg, 0.5 mg, Intravenous, Q4H PRN, Richrd Prime, PA;  DISCONTD: levothyroxine (SYNTHROID, LEVOTHROID) tablet 150 mcg, 150 mcg, Oral, QAC breakfast, Richrd Prime, [place];  DISCONTD: metoCLOPramide (REGLAN) tablet 5 mg, 5 mg, Oral, TID AC & HS, Richrd Prime, [place];  DISCONTD: metoprolol tartrate (LOPRESSOR) tablet 25 mg, 25 mg, Oral, BID, Nancy Nordmann [place], [place] DISCONTD: oxyCODONE (Oxy IR/ROXICODONE) immediate release tablet 5 mg, 5 mg, Oral, Q4H PRN, Richrd Prime, PA;  DISCONTD: pantoprazole (PROTONIX) EC tablet 40 mg, 40 mg, Oral, Q1200, Richrd Prime,  PA;  DISCONTD: phenol (CHLORASEPTIC) mouth spray 1 spray, 1 spray, Mouth/Throat, PRN, Richrd Prime, PA;  DISCONTD: warfarin (COUMADIN) tablet 2 mg, 2 mg, Oral, ONCE-1800, Nancy Nordmann [place], [place] DISCONTD: warfarin (COUMADIN) tablet 4 mg, 4 mg, Oral, ONCE-1800, Nancy Nordmann [place], [place];  DISCONTD: warfarin (COUMADIN) video, , Does not apply, Once, Richrd Prime, [place];  DISCONTD: Warfarin - Pharmacist Dosing Inpatient, , Does not apply, q1800, Richrd Prime, PA  Patients Current Diet: General  Precautions / Restrictions Precautions Precautions: Fall Precaution Comments: Currently pt transfers only and no amb due to ischemic rt foot. 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[employer] / [employer] Devices/Equipment: None Home Adaptive Equipment: None  Prior Functional Level Prior Function Level of  Independence: Independent Able to Take Stairs?: Yes Driving: Yes Vocation: Part time employment  Current Functional Level Cognition  Arousal/Alertness: Awake/alert Overall Cognitive Status: Appears within functional limits for tasks assessed/performed Orientation Level: Oriented X4    Extremity Assessment (includes Sensation/Coordination)  RUE ROM/Strength/Tone: Within functional levels  RLE ROM/Strength/Tone: Deficits RLE ROM/Strength/Tone Deficits: grossly 4/5    ADLs  Eating/Feeding: Simulated;Set up Where Assessed - Eating/Feeding: Bed level Grooming: Performed;Minimal assistance Where Assessed - Grooming: Supported standing Upper Body Bathing: Simulated;Minimal assistance Where Assessed - Upper Body Bathing: Unsupported sitting Lower Body Bathing: Simulated;Maximal assistance Where Assessed - Lower Body Bathing: Supported sit to stand Upper Body Dressing: Simulated;Maximal assistance Where Assessed - Upper Body Dressing: Unsupported sitting Lower Body Dressing: Simulated;+1 Total assistance Where Assessed - Lower Body Dressing: Supported sit to Lobbyist: Other (comment);Moderate assistance;Performed (bed to [facility]) Toilet Transfer: Patient Percentage: 60% Toilet Transfer Method: Stand pivot;Other (comment) (bed to chair) Toilet Transfer Equipment:  (Bed (raised) side step to recliner on his left) Toileting - Clothing Manipulation and Hygiene: Simulated;+1 Total assistance Where Assessed - Camera operator Manipulation and Hygiene: Standing Equipment Used: Rolling walker;Gait belt Transfers/Ambulation Related to ADLs: Total A +2 pt=60% ADL Comments: Modified activity in absence of second person assist.    Mobility  Bed Mobility: Not assessed (pt up in chair) Supine to Sit: 4: Min assist;HOB elevated Sitting - Scoot to Edge of Bed: 3: Mod assist Sit to Supine: 6: Modified independent (Device/Increase time);HOB flat;With rail    Transfers  Transfers: Sit to  Stand;Stand to Sit;Stand Pivot Transfers Sit to Stand: 3: Mod assist;With upper extremity assist;From chair/3-in-1 Sit to Stand: Patient Percentage: 70% Stand to Sit: 4: Min assist;To chair/3-in-1;With upper extremity assist Stand to Sit: Patient Percentage: 70% Stand Pivot Transfers: 1: +2 Total assist;With armrests Stand Pivot Transfers: Patient Percentage: 70%    Ambulation / Gait / Stairs / Wheelchair Mobility  Ambulation/Gait Ambulation/Gait Assistance: 3: Mod assist Ambulation Distance (Feet): 40 Feet Assistive device: Rolling walker Ambulation/Gait Assistance Details: patient with flexed trunk, holding breath while walking, when cued to stand up straight he tips backwards.   Gait Pattern: Step-to pattern;Antalgic;Trunk flexed    Posture / Balance Static Standing Balance Static Standing - Balance Support: Bilateral upper extremity supported Static Standing - Level of Assistance: 4: Min assist Static Standing - Comment/# of Minutes: approximately 3     Previous Home Environment Living Arrangements: Spouse/significant other Lives With: Spouse Available Help at Discharge: Family Type of Home: House Home Layout: One level Home Access: Stairs to enter Entrance Stairs-Rails: None Entrance Stairs-Number of Steps:  2 Bathroom Shower/Tub: Walk-in shower;Door [employer] Toilet: Standard Bathroom Accessibility: Yes How Accessible: Accessible via walker Home Care Services: No Additional Comments: works for Education [employer].  Discharge Living Setting Plans for Discharge Living Setting: Patient's home;House;Lives with (comment) (wife) Type of Home at Discharge: House Discharge Home Layout: One level Discharge Home Access: Stairs to enter Entrance Stairs-Rails: None Entrance Stairs-Number of Steps: 2 Discharge Bathroom Shower/Tub: Horticulturist, commercial: Standard Discharge Bathroom Accessibility: Yes How Accessible: Accessible via walker Do you  have any problems obtaining your medications?: No  Social/Family/Support Systems Patient Roles: Spouse;Parent (employee) Contact Information: Government social research officer, 78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 also active Anticipated Caregiver: wife Anticipated Caregiver's Contact Information: see above Ability/Limitations of Caregiver: none Caregiver Availability: 24/7 Discharge Plan Discussed with Primary Caregiver: Yes Is Caregiver In Agreement with Plan?: Yes Does Caregiver/Family have Issues with Lodging/Transportation while Pt is in Rehab?: No  Goals/Additional Needs Patient/Family Goal for Rehab: supervision with PT and OT Expected length of stay: ELOS 7 to 10 days Additional Information: r foot with ischemic changes remain, needs close monitoring Pt/Family Agrees to Admission and willing to participate: Yes Program Orientation Provided & Reviewed with Pt/Caregiver Including Roles  & Responsibilities: Yes  Patient Condition: This patient's condition remains as documented in the Consult dated 12/12/11, in which the Rehabilitation Physician determined and documented that the patient's condition is appropriate for intensive rehabilitative care in an inpatient rehabilitation facility.  Preadmission Screen Completed By:  Cleatrice Burke, 12/13/2011 3:51 PM ______________________________________________________________________   Discussed status with Dr. Naaman Plummer on 12/13/11 at  1550 and received telephone approval for admission today.  Admission Coordinator:  Cleatrice Burke, time [id number] Date 12/13/11.

## 2011-12-12 NOTE — Consult Note (Signed)
Physical Medicine and Rehabilitation Consult Reason for Consult: RLE pain due to thrombectomy, AAA repair. Referring Physician:  Dr. Kellie Simmering.   HPI: [name] is a 78 y.o. male with HTN, GERD, and AAA who underwent resection and grafting of an infrarenal AAA with insertion of a common iliac graft and exploration of R popliteal artery with embolectomy of posterior tibial and anterior tibial arteries with intraoperative arteriogram of R leg on 12/05/11. 6 hours after the case he lost pulses in the R foot so he was taken back to the OR for thrombectomy of the anterior tibial, posterior tibial, and peroneal arteries. Post op with EKG changes with mild elevation in cardiac enzymes likely multifactorial per cardiology.  Fluid overload treated with diuretics. Therapies initiated and patient limited by right foot pain and decreased posture. Therapy team and MD recommending CIR.   Patient now has a Darco sandal for the right lower extremity. He is nonweightbearing on the toes. Review of Systems  HENT: Negative for neck pain.   Eyes: Negative for blurred vision and double vision.  Respiratory: Negative for cough and shortness of breath.   Cardiovascular: Negative for chest pain and palpitations.  Gastrointestinal: Negative for heartburn, nausea and constipation.  Genitourinary: Negative for urgency and frequency.  Musculoskeletal: Negative for myalgias.  Neurological: Positive for weakness. Negative for headaches.  Psychiatric/Behavioral: The patient is not nervous/anxious and does not have insomnia.    Past Medical History  Diagnosis Date  . AAA (abdominal aortic aneurysm)   . GERD (gastroesophageal reflux disease)   . Arthritis   . Hypertension   . Chronic kidney disease     Renal insufficiency  . Coronary artery disease   . Hemorrhoid    Past Surgical History  Procedure Date  . Eye surgery ~ 1 year    cataract  . Abdominal aortic aneurysm repair 12/05/2011    Procedure: ANEURYSM ABDOMINAL  AORTIC REPAIR;  Surgeon: Mal Misty, MD;  Location: [facility] OR;  Service: Vascular;  Laterality: N/A;  Resection and Grafting of Abdominal Aortic Aneurysm using  18x77mm x 40cm Hemashielpd Gold Vascular Graft  . Artery exploration 12/05/2011    Procedure: ARTERY EXPLORATION;  Surgeon: Mal Misty, MD;  Location: [facility] OR;  Service: Vascular;  Laterality: Right;  Right Popliteal Artery Exploration  . Embolectomy 12/05/2011    Procedure: EMBOLECTOMY;  Surgeon: Mal Misty, MD;  Location: [place];  Service: Vascular;  Laterality: Right;  . Intraoperative arteriogram 12/05/2011    Procedure: INTRA OPERATIVE ARTERIOGRAM;  Surgeon: Mal Misty, MD;  Location: [place];  Service: Vascular;  Laterality: Right;  . Embolectomy 12/05/2011    Procedure: EMBOLECTOMY;  Surgeon: Mal Misty, MD;  Location: [place];  Service: Vascular;  Laterality: Right;  Thrombectomy of right anterior/posterior Tibial Arterys with vein patch angioplasty of tibial/peroneal trunk.   Family History  Problem Relation Age of Onset  . Coronary artery disease Mother   . Kidney disease Mother   . Heart attack Mother   . Stroke Father   . Hypertension Father    Social History: Married. 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 He  reports that he quit smoking about 38 years ago. His smoking use included Cigarettes. He smoked 1 pack per day. He has never used smokeless tobacco. He reports that he does not drink alcohol or use illicit drugs.  Allergies: No Known Allergies  Medications Prior to Admission  Medication Sig Dispense Refill  . aspirin 81  MG tablet Take 81 mg by mouth daily.      . fish oil-omega-3 fatty acids 1000 MG capsule Take 2 g by mouth daily.      Marland Kitchen levothyroxine (SYNTHROID, LEVOTHROID) 150 MCG tablet Take 150 mcg by mouth daily.      Marland Kitchen lisinopril-hydrochlorothiazide (PRINZIDE,ZESTORETIC) 10-12.5 MG per tablet Take 1 tablet by mouth daily.      . Multiple Vitamins-Minerals  (MULTIVITAMIN WITH MINERALS) tablet Take 1 tablet by mouth daily.      . ranitidine (ZANTAC) 150 MG tablet Take 150 mg by mouth 2 (two) times daily.        Home: Home Living Lives With: Spouse Available Help at Discharge: Family Type of Home: House Home Access: Stairs to enter Technical brewer of Steps: 2 Entrance Stairs-Rails: None Home Layout: One level Bathroom Shower/Tub: Walk-in shower;Door [employer] Toilet: Standard Bathroom Accessibility: Yes How Accessible: Accessible via walker Home Adaptive Equipment: None Additional Comments: works for [employer].  Functional History: Prior Function Able to Take Stairs?: Yes Driving: Yes Vocation: Part time employment Functional Status:  Mobility: Bed Mobility Bed Mobility: Sit to Supine Supine to Sit: 4: Min assist;HOB elevated Sitting - Scoot to Edge of Bed: 3: Mod assist Sit to Supine: 6: Modified independent (Device/Increase time);HOB flat;With rail Transfers Transfers: Sit to Stand;Stand to Sit;Stand Pivot Transfers Sit to Stand: 3: Mod assist;From bed;With upper extremity assist Sit to Stand: Patient Percentage: 70% Stand to Sit: To chair/3-in-1;With armrests;With upper extremity assist Stand to Sit: Patient Percentage: 70% Stand Pivot Transfers: 1: +2 Total assist;With armrests Stand Pivot Transfers: Patient Percentage: 70% Ambulation/Gait Ambulation/Gait Assistance: 3: Mod assist Ambulation Distance (Feet): 40 Feet Assistive device: Rolling walker Ambulation/Gait Assistance Details: patient with flexed trunk, holding breath while walking, when cued to stand up straight he tips backwards.   Gait Pattern: Step-to pattern;Antalgic;Trunk flexed    ADL: ADL Eating/Feeding: Simulated;Set up Where Assessed - Eating/Feeding: Bed level Grooming: Simulated;Set up Where Assessed - Grooming: Unsupported sitting Upper Body Bathing: Simulated;Minimal assistance Where Assessed - Upper Body Bathing:  Unsupported sitting Lower Body Bathing: Simulated;Maximal assistance Where Assessed - Lower Body Bathing: Supported sit to stand Upper Body Dressing: Simulated;Maximal assistance Where Assessed - Upper Body Dressing: Unsupported sitting Lower Body Dressing: Simulated;+1 Total assistance Where Assessed - Lower Body Dressing: Supported sit to stand Toilet Transfer: Other (comment);Moderate assistance;Performed (bed to [facility]) Toilet Transfer Method: Stand pivot;Other (comment) (bed to chair) Toilet Transfer Equipment:  (Bed (raised) side step to recliner on his left) Transfers/Ambulation Related to ADLs: Total A +2 pt=60% ADL Comments: Pt getting ready to transfers rooms. Pt in good spirits and willing to take a few extra steps to WC.  Cognition: Cognition Arousal/Alertness: Awake/alert Orientation Level: Oriented X4 Cognition Overall Cognitive Status: Appears within functional limits for tasks assessed/performed Arousal/Alertness: Awake/alert Orientation Level: Appears intact for tasks assessed Behavior During Session: [facility] for tasks performed  Blood pressure 137/71, pulse 63, temperature 98.6 F (37 C), temperature source Oral, resp. rate 18, height 6' (1.829 m), weight 96.9 kg (213 lb 10 oz), SpO2 95.00%. Physical Exam  Nursing note and vitals reviewed. Constitutional: He is oriented to person, place, and time. He appears well-developed and well-nourished.  HENT:  Head: Normocephalic and atraumatic.  Eyes: Pupils are equal, round, and reactive to light.  Neck: Normal range of motion. Neck supple.  Cardiovascular: Normal rate and regular rhythm.   Pulmonary/Chest: Effort normal and breath sounds normal.  Abdominal: Soft. Bowel sounds are normal.  Musculoskeletal: He exhibits edema.  1+ bilateral pedal edema. Right forefoot cyanotic with blistering. +tenderness.   Neurological: He is alert and oriented to person, place, and time.  Skin:       Incisions mid chest and RLE with  staples intact-clean and dry.   Psychiatric: He has a normal mood and affect. His behavior is normal. Thought content normal.  motor strength is 5/5 bilateral deltoid, biceps, triceps, grip 4/5 in the left hip flexor knee extensor ankle dorsiflexor 3/5 in the right hip flexor knee extensor and 3 minus ankle dorsiflex plantar flexors and 1/5 in the toe flexors and extensors Sensation reduced in the right foot otherwise intact  Results for orders placed during the hospital encounter of 12/05/11 (from the past 24 hour(s))  CREATININE, SERUM     Status: Abnormal   Collection Time   12/12/11  6:40 AM      Component Value Range   Creatinine, Ser 2.16 (*) 0.50 - 1.35 mg/dL   GFR calc non Af Amer 27 (*) >90 mL/min   GFR calc Af Amer 31 (*) >90 mL/min  CBC     Status: Abnormal   Collection Time   12/12/11  6:40 AM      Component Value Range   WBC 8.1  4.0 - 10.5 K/uL   RBC 2.49 (*) 4.22 - 5.81 MIL/uL   Hemoglobin 8.0 (*) 13.0 - 17.0 g/dL   HCT 23.2 (*) 39.0 - 52.0 %   MCV 93.2  78.0 - 100.0 fL   MCH 32.1  26.0 - 34.0 pg   MCHC 34.5  30.0 - 36.0 g/dL   RDW 14.3  11.5 - 15.5 %   Platelets 150  150 - 400 K/uL  PROTIME-INR     Status: Abnormal   Collection Time   12/12/11  6:40 AM      Component Value Range   Prothrombin Time 25.8 (*) 11.6 - 15.2 seconds   INR 2.31 (*) 0.00 - 1.49  HEPARIN LEVEL (UNFRACTIONATED)     Status: Abnormal   Collection Time   12/12/11  6:40 AM      Component Value Range   Heparin Unfractionated <0.10 (*) 0.30 - 0.70 IU/mL   No results found.  Assessment/Plan: 1. Diagnosis: deconditioning after AAA repair and right tibial and peroneal thrombectomyDoes the need for close, 24 hr/day medical supervision in concert with the patient's rehab needs make it unreasonable for this patient to be served in a less intensive setting? Yes 2. Co-Morbidities requiring supervision/potential complications: chronic kidney disease, coronary artery disease, hypertension 3. Due to bowel  management, safety, skin/wound care, disease management, medication administration, pain management and patient education, does the patient require 24 hr/day rehab nursing? Yes 4. Does the patient require coordinated care of a physician, rehab nurse, PT (1-2 hrs/day, 5 days/week) and OT (1-2 hrs/day, 5 days/week) to address physical and functional deficits in the context of the above medical diagnosis(es)? Yes Addressing deficits in the following areas: balance, endurance, locomotion, strength, transferring, bowel/bladder control, bathing, dressing, feeding, grooming and toileting 5. Can the patient actively participate in an intensive therapy program of at least 3 hrs of therapy per day at least 5 days per week? Yes 6. The potential for patient to make measurable gains while on inpatient rehab is excellent 7. Anticipated functional outcomes upon discharge from inpatient rehab are supervision mobility with PT, supervision to modified independent ADLs with OT, not applicable with SLP. 8. Estimated rehab length of stay to reach the above functional goals is: 7-10 days  9. Does the patient have adequate social supports to accommodate these discharge functional goals? Yes 10. Anticipated D/C setting: Home 11. Anticipated post D/C treatments: [place] therapy 12. Overall Rehab/Functional Prognosis: excellent  RECOMMENDATIONS: This patient's condition is appropriate for continued rehabilitative care in the following setting: CIR Patient has agreed to participate in recommended program. Yes Note that insurance prior authorization may be required for reimbursement for recommended care.  Comment:    12/12/2011

## 2011-12-12 NOTE — Progress Notes (Signed)
I met with patient at bedside. Patient will benefit from an inpt rehab admission before d/c with his wife. I have begun insurance authorization and can admit patient as soon as approved. Please clarify if pt felt medically ready to d/c to rehab 8/22. I will follow up in the morning [phone]

## 2011-12-12 NOTE — Progress Notes (Signed)
Orthopedic Tech Progress Note Patient Details:  [name] [birth year] [id number]  Ortho Devices Type of Ortho Device: Postop boot Ortho Device/Splint Location: darco shoe Ortho Device/Splint Interventions: Application   Cammer, Theodoro Parma 12/12/2011, 10:31 AM

## 2011-12-12 NOTE — Progress Notes (Signed)
ANTICOAGULATION CONSULT NOTE - Follow Up Consult  Pharmacy Consult for Coumadin Indication: s/p embolectomy, thrombectomy with AAA repair   No Known Allergies  Patient Measurements: Height: 6' (182.9 cm) Weight: 213 lb 10 oz (96.9 kg) IBW/kg (Calculated) : 77.6   Vital Signs: Temp: 98.6 F (37 C) (08/21 0354) Temp src: Oral (08/21 0354) BP: 137/71 mmHg (08/21 0354) Pulse Rate: 63  (08/21 0354)  Labs:  Basename 12/12/11 0640 12/11/11 0429 12/10/11 0435  HGB 8.0* 7.9* --  HCT 23.2* 23.2* 23.8*  PLT 150 119* 105*  APTT -- -- --  LABPROT 25.8* 25.3* 18.2*  INR 2.31* 2.26* 1.48  HEPARINUNFRC <0.10* 0.38 0.25*  CREATININE 2.16* 2.12* 2.44*  CKTOTAL -- -- --  CKMB -- -- --  TROPONINI -- -- --    Estimated Creatinine Clearance: 32.4 ml/min (by C-G formula based on Cr of 2.16).   Assessment: INR remains therapeutic today at 2.31.  No bleeding noted.  Goal of Therapy:  INR 2-3  Plan:  1) Repeat Coumadin 4 mg po x 1 tonight  [name] P 12/12/2011,8:47 AM

## 2011-12-12 NOTE — Progress Notes (Addendum)
[facility] [place]  Post-op  Intra-abdominal Surgery note  Date of Surgery: 12/05/2011 - 12/06/2011 Surgeon: Juliann Mule): Mal Misty, MD POD: 7 Days Post-Op Procedure(s): AAA repair EMBOLECTOMY ANGIOGRAM EXTREMITY RIGHT  History of Present Illness  [name] is a 78 y.o. male who is  7 days post-op Pt is doing well. complains of right foot [pain. Ambulated with PT ; . has had flatus; has had large BM yesterday  IMAGING: No results found.  Significant Diagnostic Studies: CBC Lab Results  Component Value Date   WBC 8.1 12/12/2011   HGB 8.0* 12/12/2011   HCT 23.2* 12/12/2011   MCV 93.2 12/12/2011   PLT 150 12/12/2011    BMET    Component Value Date/Time   NA 139 12/11/2011 0429   K 3.6 12/11/2011 0429   CL 105 12/11/2011 0429   CO2 25 12/11/2011 0429   GLUCOSE 116* 12/11/2011 0429   BUN 36* 12/11/2011 0429   CREATININE 2.12* 12/11/2011 0429   CALCIUM 8.5 12/11/2011 0429   GFRNONAA 28* 12/11/2011 0429   GFRAA 32* 12/11/2011 0429    COAG Lab Results  Component Value Date   INR 2.26* 12/11/2011   INR 1.48 12/10/2011   INR 1.01 12/09/2011   No results found for this basename: PTT    I/O last 3 completed shifts: In: 610 [I.V.:600; IV Piggyback:10] Out: 2835 [Urine:2835]    Physical Examination BP Readings from Last 3 Encounters:  12/12/11 137/71  12/12/11 137/71  12/12/11 137/71   Temp Readings from Last 3 Encounters:  12/12/11 98.6 F (37 C) Oral  12/12/11 98.6 F (37 C) Oral  12/12/11 98.6 F (37 C) Oral   SpO2 Readings from Last 3 Encounters:  12/12/11 95%  12/12/11 95%  12/12/11 95%   Pulse Readings from Last 3 Encounters:  12/12/11 63  12/12/11 63  12/12/11 63    General: A&O x 3, WDWN male in NAD Pulmonary: normal non-labored breathing , without Rales, rhonchi,  wheezing Cardiac: Heart rate : regular ,  Abdomen:abdomen soft, non-tender and normal active bowel sounds Abdominal wound:clean, dry, intact  Neurologic: A&O X 3;  Appropriate Affect ; SENSATION: normal; MOTOR FUNCTION:  moving all extremities equally. Speech is fluent/normal  Vascular Exam:BLE warm and well perfused Extremities without ischemic changes, no Gangrene, no cellulitis; no open wounds;   LOWER EXTREMITY PULSES           RIGHT                                      LEFT      POSTERIOR TIBIAL  palpable        DORSALIS PEDIS      ANTERIOR TIBIAL  palpable        Non-Invasive Vascular Imaging ABI'S: Pending  Assessment/Plan: [name] is a 78 y.o. male who is 7 Days Post-Op Procedure(s): AAA EMBOLECTOMY ANGIOGRAM EXTREMITY RIGHT Pt doing well Right foot more mottled today, warm, palp PT, blister on top of foot, demarcating Had large BM yest. Taking reg diet DC planning CIR to see H/H stable BUN/CR stable    [name],[name] [phone] 12/12/2011 7:50 AM      Right foot with 3+ posterior tibial pulse palpable and excellent Doppler flow. There is a small blister on the dorsum of foot and 2 small blisters over toes. Underlying skin is unchanged. Skin may still be viable although  quite discolored and all toes. He is beginning to ambulate better with Darco shoe.  Appetite is good and he is eating diet well. Good bowel movement yesterday.  Agree with CIR for 7-10 days and then DC home. Coumadin is regulated at present with INR 2.3 continues to make slow improvement. Will take several weeks to determine if distal foot is viable

## 2011-12-13 ENCOUNTER — Encounter (HOSPITAL_COMMUNITY): Payer: Self-pay | Admitting: *Deleted

## 2011-12-13 ENCOUNTER — Inpatient Hospital Stay (HOSPITAL_COMMUNITY)
Admission: RE | Admit: 2011-12-13 | Discharge: 2011-12-19 | DRG: 945 | Disposition: A | Discharge status: Home Health | Payer: Medicare Other | Source: Ambulatory Visit | Attending: Physical Medicine & Rehabilitation | Admitting: Physical Medicine & Rehabilitation

## 2011-12-13 ENCOUNTER — Telehealth: Payer: Self-pay | Admitting: Vascular Surgery

## 2011-12-13 DIAGNOSIS — I998 Other disorder of circulatory system: Secondary | ICD-10-CM

## 2011-12-13 DIAGNOSIS — E8779 Other fluid overload: Secondary | ICD-10-CM

## 2011-12-13 DIAGNOSIS — Z87891 Personal history of nicotine dependence: Secondary | ICD-10-CM

## 2011-12-13 DIAGNOSIS — M129 Arthropathy, unspecified: Secondary | ICD-10-CM

## 2011-12-13 DIAGNOSIS — E039 Hypothyroidism, unspecified: Secondary | ICD-10-CM

## 2011-12-13 DIAGNOSIS — I2510 Atherosclerotic heart disease of native coronary artery without angina pectoris: Secondary | ICD-10-CM | POA: Diagnosis present

## 2011-12-13 DIAGNOSIS — I743 Embolism and thrombosis of arteries of the lower extremities: Secondary | ICD-10-CM

## 2011-12-13 DIAGNOSIS — N189 Chronic kidney disease, unspecified: Secondary | ICD-10-CM

## 2011-12-13 DIAGNOSIS — I82509 Chronic embolism and thrombosis of unspecified deep veins of unspecified lower extremity: Secondary | ICD-10-CM

## 2011-12-13 DIAGNOSIS — I714 Abdominal aortic aneurysm, without rupture, unspecified: Secondary | ICD-10-CM | POA: Diagnosis present

## 2011-12-13 DIAGNOSIS — K219 Gastro-esophageal reflux disease without esophagitis: Secondary | ICD-10-CM

## 2011-12-13 DIAGNOSIS — Z5189 Encounter for other specified aftercare: Principal | ICD-10-CM

## 2011-12-13 DIAGNOSIS — D72829 Elevated white blood cell count, unspecified: Secondary | ICD-10-CM

## 2011-12-13 DIAGNOSIS — R5381 Other malaise: Secondary | ICD-10-CM

## 2011-12-13 DIAGNOSIS — L02619 Cutaneous abscess of unspecified foot: Secondary | ICD-10-CM

## 2011-12-13 DIAGNOSIS — N289 Disorder of kidney and ureter, unspecified: Secondary | ICD-10-CM

## 2011-12-13 DIAGNOSIS — Z7982 Long term (current) use of aspirin: Secondary | ICD-10-CM

## 2011-12-13 DIAGNOSIS — I129 Hypertensive chronic kidney disease with stage 1 through stage 4 chronic kidney disease, or unspecified chronic kidney disease: Secondary | ICD-10-CM

## 2011-12-13 DIAGNOSIS — Z79899 Other long term (current) drug therapy: Secondary | ICD-10-CM

## 2011-12-13 DIAGNOSIS — L03119 Cellulitis of unspecified part of limb: Secondary | ICD-10-CM

## 2011-12-13 DIAGNOSIS — Z7901 Long term (current) use of anticoagulants: Secondary | ICD-10-CM

## 2011-12-13 DIAGNOSIS — Z9889 Other specified postprocedural states: Secondary | ICD-10-CM

## 2011-12-13 DIAGNOSIS — D62 Acute posthemorrhagic anemia: Secondary | ICD-10-CM

## 2011-12-13 LAB — CBC
HCT: 24 % — ABNORMAL LOW (ref 39.0–52.0)
Hemoglobin: 8.2 g/dL — ABNORMAL LOW (ref 13.0–17.0)
MCH: 31.7 pg (ref 26.0–34.0)
MCV: 92.7 fL (ref 78.0–100.0)
RBC: 2.59 MIL/uL — ABNORMAL LOW (ref 4.22–5.81)

## 2011-12-13 LAB — PROTIME-INR: Prothrombin Time: 28.6 seconds — ABNORMAL HIGH (ref 11.6–15.2)

## 2011-12-13 MED ORDER — WARFARIN - PHARMACIST DOSING INPATIENT: Freq: Every day | Status: DC

## 2011-12-13 MED ORDER — OMEGA-3-ACID ETHYL ESTERS 1 G PO CAPS
1.0000 g | ORAL_CAPSULE | Freq: Every day | ORAL | Status: DC
  Administered 2011-12-13 – 2011-12-19 (×7): 1 g via ORAL
  Filled 2011-12-13 (×8): qty 1

## 2011-12-13 MED ORDER — OMEGA-3 FATTY ACIDS 1000 MG PO CAPS: 2.0000 g | ORAL_CAPSULE | Freq: Every day | ORAL | Status: DC

## 2011-12-13 MED ORDER — PROCHLORPERAZINE EDISYLATE 5 MG/ML IJ SOLN
5.0000 mg | Freq: Four times a day (QID) | INTRAMUSCULAR | Status: DC | PRN
  Filled 2011-12-13: qty 2

## 2011-12-13 MED ORDER — PANTOPRAZOLE SODIUM 40 MG PO TBEC: 40.0000 mg | DELAYED_RELEASE_TABLET | Freq: Every day | ORAL | Status: DC

## 2011-12-13 MED ORDER — ACETAMINOPHEN 650 MG RE SUPP: 325.0000 mg | RECTAL | Status: DC | PRN

## 2011-12-13 MED ORDER — DIPHENHYDRAMINE HCL 12.5 MG/5ML PO ELIX: 12.5000 mg | ORAL_SOLUTION | Freq: Four times a day (QID) | ORAL | Status: DC | PRN

## 2011-12-13 MED ORDER — METHOCARBAMOL 500 MG PO TABS: 500.0000 mg | ORAL_TABLET | Freq: Four times a day (QID) | ORAL | Status: DC | PRN

## 2011-12-13 MED ORDER — ATORVASTATIN CALCIUM 80 MG PO TABS: 80.0000 mg | ORAL_TABLET | Freq: Every day | ORAL | Status: DC

## 2011-12-13 MED ORDER — HYDROMORPHONE HCL PF 1 MG/ML IJ SOLN: 0.5000 mg | INTRAMUSCULAR | Status: DC | PRN

## 2011-12-13 MED ORDER — PROCHLORPERAZINE MALEATE 5 MG PO TABS
5.0000 mg | ORAL_TABLET | Freq: Four times a day (QID) | ORAL | Status: DC | PRN
  Filled 2011-12-13: qty 2

## 2011-12-13 MED ORDER — DIPHENHYDRAMINE HCL 50 MG/ML IJ SOLN: 12.5000 mg | Freq: Four times a day (QID) | INTRAMUSCULAR | Status: DC | PRN

## 2011-12-13 MED ORDER — PHENOL 1.4 % MT LIQD: 1.0000 | OROMUCOSAL | Status: DC | PRN

## 2011-12-13 MED ORDER — ALUM & MAG HYDROXIDE-SIMETH 200-200-20 MG/5ML PO SUSP: 15.0000 mL | ORAL | Status: DC | PRN

## 2011-12-13 MED ORDER — TRAMADOL HCL 50 MG PO TABS
50.0000 mg | ORAL_TABLET | Freq: Four times a day (QID) | ORAL | Status: DC | PRN
  Administered 2011-12-14 – 2011-12-17 (×5): 50 mg via ORAL
  Filled 2011-12-13 (×5): qty 1

## 2011-12-13 MED ORDER — LEVOTHYROXINE SODIUM 150 MCG PO TABS
150.0000 ug | ORAL_TABLET | Freq: Every day | ORAL | Status: DC
  Administered 2011-12-14 – 2011-12-19 (×6): 150 ug via ORAL
  Filled 2011-12-13 (×8): qty 1

## 2011-12-13 MED ORDER — ASPIRIN EC 81 MG PO TBEC: 81.0000 mg | DELAYED_RELEASE_TABLET | Freq: Every day | ORAL | Status: DC

## 2011-12-13 MED ORDER — ADULT MULTIVITAMIN W/MINERALS CH
1.0000 | ORAL_TABLET | Freq: Every day | ORAL | Status: DC
  Administered 2011-12-13 – 2011-12-19 (×7): 1 via ORAL
  Filled 2011-12-13 (×8): qty 1

## 2011-12-13 MED ORDER — METOPROLOL TARTRATE 25 MG PO TABS
25.0000 mg | ORAL_TABLET | Freq: Two times a day (BID) | ORAL | Status: DC
  Administered 2011-12-13 – 2011-12-19 (×12): 25 mg via ORAL
  Filled 2011-12-13 (×15): qty 1

## 2011-12-13 MED ORDER — FAMOTIDINE 20 MG PO TABS
20.0000 mg | ORAL_TABLET | Freq: Every day | ORAL | Status: DC
  Administered 2011-12-14 – 2011-12-19 (×6): 20 mg via ORAL
  Filled 2011-12-13 (×8): qty 1

## 2011-12-13 MED ORDER — OXYCODONE HCL 5 MG PO TABS: 5.0000 mg | ORAL_TABLET | ORAL | Status: DC | PRN

## 2011-12-13 MED ORDER — WARFARIN SODIUM 2 MG PO TABS: 2.0000 mg | ORAL_TABLET | Freq: Once | ORAL | Status: DC

## 2011-12-13 MED ORDER — BISACODYL 10 MG RE SUPP
10.0000 mg | Freq: Every day | RECTAL | Status: DC | PRN
  Administered 2011-12-16: 10 mg via RECTAL
  Filled 2011-12-13 (×2): qty 1

## 2011-12-13 MED ORDER — WARFARIN SODIUM 2 MG PO TABS
2.0000 mg | ORAL_TABLET | Freq: Once | ORAL | Status: DC
  Filled 2011-12-13: qty 1

## 2011-12-13 MED ORDER — POLYSACCHARIDE IRON COMPLEX 150 MG PO CAPS
150.0000 mg | ORAL_CAPSULE | Freq: Two times a day (BID) | ORAL | Status: DC
  Administered 2011-12-14 – 2011-12-19 (×10): 150 mg via ORAL
  Filled 2011-12-13 (×12): qty 1

## 2011-12-13 MED ORDER — MULTI-VITAMIN/MINERALS PO TABS: 1.0000 | ORAL_TABLET | Freq: Every day | ORAL | Status: DC

## 2011-12-13 MED ORDER — BIOTENE DRY MOUTH MT LIQD: 15.0000 mL | Freq: Four times a day (QID) | OROMUCOSAL | Status: DC

## 2011-12-13 MED ORDER — ASPIRIN EC 81 MG PO TBEC
81.0000 mg | DELAYED_RELEASE_TABLET | Freq: Every day | ORAL | Status: DC
  Administered 2011-12-14 – 2011-12-19 (×6): 81 mg via ORAL
  Filled 2011-12-13 (×8): qty 1

## 2011-12-13 MED ORDER — GUAIFENESIN-DM 100-10 MG/5ML PO SYRP: 15.0000 mL | ORAL_SOLUTION | ORAL | Status: DC | PRN

## 2011-12-13 MED ORDER — PATIENT'S GUIDE TO USING COUMADIN BOOK
Freq: Once | Status: DC
  Filled 2011-12-13: qty 1

## 2011-12-13 MED ORDER — WARFARIN SODIUM 2 MG PO TABS
2.0000 mg | ORAL_TABLET | Freq: Once | ORAL | Status: AC
  Administered 2011-12-13: 2 mg via ORAL
  Filled 2011-12-13: qty 1

## 2011-12-13 MED ORDER — GUAIFENESIN-DM 100-10 MG/5ML PO SYRP: 5.0000 mL | ORAL_SOLUTION | Freq: Four times a day (QID) | ORAL | Status: DC | PRN

## 2011-12-13 MED ORDER — POLYETHYLENE GLYCOL 3350 17 G PO PACK
17.0000 g | PACK | Freq: Every day | ORAL | Status: DC
  Administered 2011-12-14 – 2011-12-16 (×3): 17 g via ORAL
  Filled 2011-12-13 (×7): qty 1

## 2011-12-13 MED ORDER — ACETAMINOPHEN 325 MG PO TABS: 325.0000 mg | ORAL_TABLET | ORAL | Status: DC | PRN

## 2011-12-13 MED ORDER — ASPIRIN 81 MG PO TABS: 81.0000 mg | ORAL_TABLET | Freq: Every day | ORAL | Status: DC

## 2011-12-13 MED ORDER — ALUM & MAG HYDROXIDE-SIMETH 200-200-20 MG/5ML PO SUSP: 30.0000 mL | ORAL | Status: DC | PRN

## 2011-12-13 MED ORDER — LEVOTHYROXINE SODIUM 150 MCG PO TABS: 150.0000 ug | ORAL_TABLET | Freq: Every day | ORAL | Status: DC

## 2011-12-13 MED ORDER — WARFARIN VIDEO: Freq: Once | Status: DC

## 2011-12-13 MED ORDER — METOPROLOL TARTRATE 25 MG PO TABS: 25.0000 mg | ORAL_TABLET | Freq: Two times a day (BID) | ORAL | Status: DC

## 2011-12-13 MED ORDER — WARFARIN SODIUM 4 MG PO TABS
4.0000 mg | ORAL_TABLET | Freq: Once | ORAL | Status: DC
Start: 2011-12-13 — End: 2011-12-13

## 2011-12-13 MED ORDER — METOCLOPRAMIDE HCL 5 MG PO TABS: 5.0000 mg | ORAL_TABLET | Freq: Three times a day (TID) | ORAL | Status: DC

## 2011-12-13 MED ORDER — FLEET ENEMA 7-19 GM/118ML RE ENEM: 1.0000 | ENEMA | Freq: Once | RECTAL | Status: AC | PRN

## 2011-12-13 MED ORDER — SODIUM CHLORIDE 0.9 % IJ SOLN: 10.0000 mL | INTRAMUSCULAR | Status: DC | PRN

## 2011-12-13 MED ORDER — PROCHLORPERAZINE 25 MG RE SUPP
12.5000 mg | Freq: Four times a day (QID) | RECTAL | Status: DC | PRN
  Filled 2011-12-13: qty 1

## 2011-12-13 MED ORDER — OXYCODONE HCL 5 MG PO TABS
5.0000 mg | ORAL_TABLET | ORAL | Status: DC | PRN
  Administered 2011-12-13 – 2011-12-19 (×7): 5 mg via ORAL
  Filled 2011-12-13 (×3): qty 1
  Filled 2011-12-13: qty 2
  Filled 2011-12-13 (×3): qty 1

## 2011-12-13 MED ORDER — TRAZODONE HCL 50 MG PO TABS
25.0000 mg | ORAL_TABLET | Freq: Every evening | ORAL | Status: DC | PRN
  Administered 2011-12-14 – 2011-12-18 (×5): 50 mg via ORAL
  Filled 2011-12-13 (×5): qty 1

## 2011-12-13 NOTE — Progress Notes (Addendum)
[facility] [place]  Post-op  Intra-abdominal Surgery note  Date of Surgery: 12/05/2011 - 12/06/2011 Surgeon: Juliann Mule): Mal Misty, MD POD: 8 Days Post-Op Procedure(s): EMBOLECTOMY ANGIOGRAM EXTREMITY RIGHT  History of Present Illness  [name] is a 78 y.o. male who is  up s/p Procedure(s): AAA Repair EMBOLECTOMY ANGIOGRAM EXTREMITY RIGHT Pt is doing well Taking po well  Significant Diagnostic Studies: CBC Lab Results  Component Value Date   WBC 8.8 12/13/2011   HGB 8.2* 12/13/2011   HCT 24.0* 12/13/2011   MCV 92.7 12/13/2011   PLT 182 12/13/2011    BMET    Component Value Date/Time   NA 139 12/11/2011 0429   K 3.6 12/11/2011 0429   CL 105 12/11/2011 0429   CO2 25 12/11/2011 0429   GLUCOSE 116* 12/11/2011 0429   BUN 36* 12/11/2011 0429   CREATININE 2.16* 12/12/2011 0640   CALCIUM 8.5 12/11/2011 0429   GFRNONAA 27* 12/12/2011 0640   GFRAA 31* 12/12/2011 0640    COAG Lab Results  Component Value Date   INR 2.64* 12/13/2011   INR 2.31* 12/12/2011   INR 2.26* 12/11/2011   No results found for this basename: PTT    I/O last 3 completed shifts: In: 720 [P.O.:720] Out: 3500 [Urine:3500] Total I/O In: -  Out: 400 [Urine:400]  Physical Examination BP Readings from Last 3 Encounters:  12/13/11 124/57  12/13/11 124/57  12/13/11 124/57   Temp Readings from Last 3 Encounters:  12/13/11 98.3 F (36.8 C) Oral  12/13/11 98.3 F (36.8 C) Oral  12/13/11 98.3 F (36.8 C) Oral   SpO2 Readings from Last 3 Encounters:  12/13/11 93%  12/13/11 93%  12/13/11 93%   Pulse Readings from Last 3 Encounters:  12/13/11 62  12/13/11 62  12/13/11 62    General: A&O x 3, WDWN male in NAD Pulmonary: normal non-labored breathing , without Rales, rhonchi,  wheezing Cardiac: Heart rate : regular ,  Abdomen:abdomen soft, non-tender and normal active bowel sounds Abdominal wound:clean, dry, intact  Neurologic: A&O X 3; Appropriate Affect ; SENSATION:  normal; MOTOR FUNCTION:  moving all extremities equally. Speech is fluent/normal  Vascular Exam:BLE warm and well perfused Extremities without ischemic changes, no Gangrene, no cellulitis; no open wounds;   Assessment/Plan: [name] is a 78 y.o. male who is 8 Days Post-Op  AAA EMBOLECTOMY RLE ANGIOGRAM EXTREMITY RIGHT Ready for rehab H/H stable BUN/ CR stable PT/INR therapeutic Right foot stable/ warm      [name] [id number] 12/13/2011 9:54 AM      Agree with above Right foot continues to slowly improve proximally with 3+ posterior tibial pulse palpable Coumadin fairly well-regulated Okay to go to rehabilitation We will continue to follow. Skin staples and calf staples will be removed in the next week

## 2011-12-13 NOTE — Discharge Summary (Signed)
[facility] Discharge Summary   Patient ID:  SHAQUELL HEHN MRN: [id number] DOB/AGE: [birth year] 76 y.o.  Admit date: 12/05/2011 Discharge date: 12/13/2011 Date of Surgery: 12/05/2011 - 12/06/2011 Surgeon: Juliann Mule): Mal Misty, MD  Admission Diagnosis: AAA Cold Leg  Discharge Diagnoses:  AAA Cold Leg  Secondary Diagnoses: Past Medical History  Diagnosis Date  . AAA (abdominal aortic aneurysm)   . GERD (gastroesophageal reflux disease)   . Arthritis   . Hypertension   . Chronic kidney disease     Renal insufficiency  . Coronary artery disease   . Hemorrhoid     Procedure(s): AAA EMBOLECTOMY RLE x 2 ANGIOGRAM EXTREMITY RIGHT  Discharged Condition: good  HPI: FIRMAN VANDERHEIDE is a 78 y.o.  Who was seen for further followup regarding his abdominal aortic aneurysm which has been followed for the past 5-6 years. He had a CT scan today without contrast which was have reviewed by computer. The aneurysm has now increased in size to 6 cm in diameter. It extends up to the renal arteries and is not a candidate for aortic stent grafting. Dr Kellie Simmering had a long discussion with the patient's family including the sons and the wife regarding this surgery. He was admitted for open repair of AAA   Hospital Course:  CASSEY BATESON is a 78 y.o. male is S/P open AAA repair and Right LE EMBOLECTOMY ANGIOGRAM EXTREMITY RIGHT Extubated: POD # 1 Post-op wounds healing well Pt. Ambulating, voiding and taking PO diet without difficulty. Pt pain controlled with PO pain meds. RLE demarcating, warm, 2+ PT pulse palpable/ small blister at ankle Labs as below Complications: immediately post surgery the pt was noted to have cold, pulseless right foot. He had had palpable PT pulse pre-op His right tibial arteries were explored and he had doppler flow in the foot. He was started on Heparin but the foot did not improve,. He again lost the pulse in the foot and was taken back to the OR for  embolectomy. He again had palpable PT pulse but the foot began to become mottled and demarcating. He has pain in the foot but was able to ambulate. He was begun on coumadin and his foot remains warm with palpable PT pulse. Creatinine is stable and he has asymptomatic acute blood loss post-op anemia  Consults: Dr Burt Knack - cardiology    Significant Diagnostic Studies: CBC Lab Results  Component Value Date   WBC 8.8 12/13/2011   HGB 8.2* 12/13/2011   HCT 24.0* 12/13/2011   MCV 92.7 12/13/2011   PLT 182 12/13/2011    BMET    Component Value Date/Time   NA 139 12/11/2011 0429   K 3.6 12/11/2011 0429   CL 105 12/11/2011 0429   CO2 25 12/11/2011 0429   GLUCOSE 116* 12/11/2011 0429   BUN 36* 12/11/2011 0429   CREATININE 2.16* 12/12/2011 0640   CALCIUM 8.5 12/11/2011 0429   GFRNONAA 27* 12/12/2011 0640   GFRAA 31* 12/12/2011 0640   COAG Lab Results  Component Value Date   INR 2.64* 12/13/2011   INR 2.31* 12/12/2011   INR 2.26* 12/11/2011     Disposition:  Discharge to :Rehab   Jannie, Wenrick  Home Medication Instructions [id number]   Printed on:12/13/11 1318  Medication Information                    levothyroxine (SYNTHROID, LEVOTHROID) 150 MCG tablet Take 150 mcg by mouth daily.  aspirin 81 MG tablet Take 81 mg by mouth daily.           ranitidine (ZANTAC) 150 MG tablet Take 150 mg by mouth 2 (two) times daily.           Multiple Vitamins-Minerals (MULTIVITAMIN WITH MINERALS) tablet Take 1 tablet by mouth daily.           fish oil-omega-3 fatty acids 1000 MG capsule Take 2 g by mouth daily.           Metoprolol 25 mg 1 tablet by mouth 2 times daily Coumadin per pharmacy dosing  Verbal and written Discharge instructions given to the patient. Wound care per Discharge AVS   Signed: Richrd Prime 12/13/2011, 1:18 PM

## 2011-12-13 NOTE — Progress Notes (Signed)
Insurance has approved inpt rehab admit today. [id number]

## 2011-12-13 NOTE — Plan of Care (Signed)
Overall Plan of Care [facility]) Patient Details Name: [name] MRN: [id number] DOB: 02-Jun-1928  Diagnosis: AAA repair  Primary Diagnosis:    Ischemic Foot Co-morbidities: htn, gerd, arthritis,  Functional Problem List  Patient demonstrates impairments in the following areas: Balance, Edema, Endurance, Motor, Pain and Safety  Basic ADL's: bathing, dressing and toileting Advanced ADL's: simple meal preparation  Transfers:  bed mobility, bed to chair, toilet, tub/shower, car and furniture Locomotion:  ambulation, wheelchair mobility and stairs  Additional Impairments:  None  Anticipated Outcomes Item Anticipated Outcome  Eating/Swallowing  independent  Basic self-care  Mod I   Tolieting  Mod I   Bowel/Bladder  independent  Transfers  Mod. independent   Locomotion  Mod I  Communication    Cognition    Pain  Pain less than 2  Safety/Judgment    Other     Therapy Plan:   OT Frequency: 1-2 X/day, 60-90 minutes     Team Interventions: Item RN PT OT SLP SW TR Other  Self Care/Advanced ADL Retraining   x      Neuromuscular Re-Education  x x      Therapeutic Activities  x x      UE/LE Strength Training/ROM  x x      UE/LE Coordination Activities  x x      Visual/Perceptual Remediation/Compensation         DME/Adaptive Equipment Instruction  x x      Therapeutic Exercise  x x      Balance/Vestibular Training  x x      Patient/Family Education  x x      Cognitive Remediation/Compensation         Functional Mobility Training  x x      Ambulation/Gait Training  x       IT trainer  x       Wheelchair Propulsion/Positioning  x       Functional IT sales professional Reintegration  x x      Dysphagia/Aspiration Environmental consultant         Bladder Management x        Bowel Management x        Disease Management/Prevention         Pain Management x x x      Medication Management         Skin Care/Wound  Management x        Splinting/Orthotics  x       Discharge Planning  x x  x    Psychosocial Support   x  x                       Team Discharge Planning: Destination:  Home Projected Follow-up:  PT and Home Health Projected Equipment Needs:  Secondary school teacher involved in discharge planning:  Yes  MD ELOS: 10-12 days Medical Rehab Prognosis:  Excellent Assessment: This patient was admitted due to pain and severe deconditioning resulting from his AAA repair and subsequent thromboembolus to the RLE with required thrombectomy. The team will be addressing self-care, weight bearing precautions, safety, pain mgt, wound care, fxnl mobility. Goals are set at modified indpenednt. The patient is quite motivated.

## 2011-12-13 NOTE — Progress Notes (Signed)
Nutrition Brief Note  Patient identified on the Malnutrition Screening Tool (MST) report for recent weight loss without trying (patient unsure), generating a score of 2. At this time, patient feels he's not lost weight.  Body mass index is 28.97 kg/(m^2). Pt meets criteria for Overweight based on current BMI.   Current diet order is Regular, patient is consuming approximately 100% of meals at this time. Reports a good appetite. Labs and medications reviewed.   No nutrition interventions warranted at this time. If nutrition issues arise, please consult RD.   Phillips Odor, RD, LDN Pager #: [phone] After-Hours Pager #: [phone]

## 2011-12-13 NOTE — Telephone Encounter (Signed)
Message copied by Gena Fray on Thu Dec 13, 2011  3:18 PM ------      Message from: Alfonso Patten      Created: Thu Dec 13, 2011  2:33 PM                   ----- Message -----         From: Richrd Prime, [place]         Sent: 12/13/2011   1:16 PM           To: Alfonso Patten, RN            2 week F/U Kellie Simmering - open AAA repair

## 2011-12-13 NOTE — Progress Notes (Signed)
Pt arrived to room 4032 from2000; Alert and oriented x4 ; denies pain at present; oriented to room, call light; report to eve shift. Elliot Cousin

## 2011-12-13 NOTE — H&P (Signed)
Physical Medicine and Rehabilitation Admission H&P  CC: Deconditioning due to RLE pain/ AAA repair  HPI: [name] is a 78 y.o. male with HTN, GERD, and AAA who underwent resection and grafting of an infrarenal AAA with insertion of a common iliac graft and exploration of R popliteal artery with embolectomy of posterior tibial and anterior tibial arteries with intraoperative arteriogram of R leg on 12/05/11. 6 hours after the case he lost pulses in the R foot so he was taken back to the OR for thrombectomy of the anterior tibial, posterior tibial, and peroneal arteries. Started on coumadin past surgery and ischemic foot being monitored. Post op with EKG changes with mild elevation in cardiac enzymes likely multifactorial per cardiology. Fluid overload treated with diuretics. Therapies initiated and patient limited by right foot pain and decreased posture. Therapy team recommending CIR for progression.  Review of Systems  HENT: Negative for hearing loss.  Eyes: Negative for blurred vision and double vision.  Respiratory: Negative for shortness of breath and wheezing.  Cardiovascular: Negative for chest pain and palpitations.  Gastrointestinal: Negative for heartburn, abdominal pain and constipation.  Genitourinary: Negative for urgency and frequency.  Musculoskeletal: Negative for myalgias.  Right foot pain.  Neurological: Negative for headaches.   Past Medical History   Diagnosis  Date   .  AAA (abdominal aortic aneurysm)    .  GERD (gastroesophageal reflux disease)    .  Arthritis    .  Hypertension    .  Chronic kidney disease      Renal insufficiency   .  Coronary artery disease    .  Hemorrhoid     Past Surgical History   Procedure  Date   .  Eye surgery  ~ 1 year     cataract   .  Abdominal aortic aneurysm repair  12/05/2011     Procedure: ANEURYSM ABDOMINAL AORTIC REPAIR; Surgeon: Mal Misty, MD; Location: [facility] OR; Service: Vascular; Laterality: N/A; Resection and Grafting of  Abdominal Aortic Aneurysm using 18x83mm x 40cm Hemashielpd Gold Vascular Graft   .  Artery exploration  12/05/2011     Procedure: ARTERY EXPLORATION; Surgeon: Mal Misty, MD; Location: [facility] OR; Service: Vascular; Laterality: Right; Right Popliteal Artery Exploration   .  Embolectomy  12/05/2011     Procedure: EMBOLECTOMY; Surgeon: Mal Misty, MD; Location: [place]; Service: Vascular; Laterality: Right;   .  Intraoperative arteriogram  12/05/2011     Procedure: INTRA OPERATIVE ARTERIOGRAM; Surgeon: Mal Misty, MD; Location: [place]; Service: Vascular; Laterality: Right;   .  Embolectomy  12/05/2011     Procedure: EMBOLECTOMY; Surgeon: Mal Misty, MD; Location: [place]; Service: Vascular; Laterality: Right; Thrombectomy of right anterior/posterior Tibial Arterys with vein patch angioplasty of tibial/peroneal trunk.    Family History   Problem  Relation  Age of Onset   .  Coronary artery disease  Mother    .  Kidney disease  Mother    .  Heart attack  Mother    .  Stroke  Father    .  Hypertension  Father     Social History: Married. 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 He reports that he quit smoking about 38 years ago. His smoking use included Cigarettes. He smoked 1 pack per day. He has never used smokeless tobacco. He reports that he does not drink alcohol or use illicit drugs.  Allergies: No Known Allergies  Scheduled Meds:  .  antiseptic oral rinse  15 mL  Mouth Rinse  QID   .  aspirin EC  81 mg  Oral  Daily   .  atorvastatin  80 mg  Oral  q1800   .  chlorhexidine  15 mL  Mouth Rinse  BID   .  levothyroxine  150 mcg  Oral  QAC breakfast   .  metoCLOPramide  5 mg  Oral  TID AC & HS   .  metoprolol tartrate  25 mg  Oral  BID   .  pantoprazole  40 mg  Oral  Q1200   .  patient's guide to using coumadin book   Does not apply  Once   .  warfarin  2 mg  Oral  ONCE-1800   .  warfarin  4 mg  Oral  ONCE-1800   .  warfarin   Does not apply  Once   .   Warfarin - Pharmacist Dosing Inpatient   Does not apply  q1800    Medications Prior to Admission   Medication  Sig  Dispense  Refill   .  aspirin 81 MG tablet  Take 81 mg by mouth daily.     .  fish oil-omega-3 fatty acids 1000 MG capsule  Take 2 g by mouth daily.     Marland Kitchen  levothyroxine (SYNTHROID, LEVOTHROID) 150 MCG tablet  Take 150 mcg by mouth daily.     Marland Kitchen  lisinopril-hydrochlorothiazide (PRINZIDE,ZESTORETIC) 10-12.5 MG per tablet  Take 1 tablet by mouth daily.     .  Multiple Vitamins-Minerals (MULTIVITAMIN WITH MINERALS) tablet  Take 1 tablet by mouth daily.     .  ranitidine (ZANTAC) 150 MG tablet  Take 150 mg by mouth 2 (two) times daily.      Home:  Home Living  Lives With: Spouse  Available Help at Discharge: Family  Type of Home: House  Home Access: Stairs to enter  Technical brewer of Steps: 2  Entrance Stairs-Rails: None  Home Layout: One level  Bathroom Shower/Tub: Walk-in shower;Door  [employer] Toilet: Standard  Bathroom Accessibility: Yes  How Accessible: Accessible via walker  Home Adaptive Equipment: None  Additional Comments: works for Education [employer].  Functional History:  Prior Function  Able to Take Stairs?: Yes  Driving: Yes  Vocation: Part time employment  Functional Status:  Mobility:  Bed Mobility  Bed Mobility: Sit to Supine  Supine to Sit: 4: Min assist;HOB elevated  Sitting - Scoot to Edge of Bed: 3: Mod assist  Sit to Supine: 6: Modified independent (Device/Increase time);HOB flat;With rail  Transfers  Transfers: Sit to Stand;Stand to Sit;Stand Pivot Transfers  Sit to Stand: 3: Mod assist;From bed;With upper extremity assist  Sit to Stand: Patient Percentage: 70%  Stand to Sit: To chair/3-in-1;With armrests;With upper extremity assist  Stand to Sit: Patient Percentage: 70%  Stand Pivot Transfers: 1: +2 Total assist;With armrests  Stand Pivot Transfers: Patient Percentage: 70%  Ambulation/Gait  Ambulation/Gait Assistance:  3: Mod assist  Ambulation Distance (Feet): 40 Feet  Assistive device: Rolling walker  Ambulation/Gait Assistance Details: patient with flexed trunk, holding breath while walking, when cued to stand up straight he tips backwards.  Gait Pattern: Step-to pattern;Antalgic;Trunk flexed   ADL:  ADL  Eating/Feeding: Simulated;Set up  Where Assessed - Eating/Feeding: Bed level  Grooming: Simulated;Set up  Where Assessed - Grooming: Unsupported sitting  Upper Body Bathing: Simulated;Minimal assistance  Where Assessed - Upper Body Bathing: Unsupported sitting  Lower Body Bathing: Simulated;Maximal assistance  Where Assessed - Lower Body Bathing: Supported sit to stand  Upper Body Dressing: Simulated;Maximal assistance  Where Assessed - Upper Body Dressing: Unsupported sitting  Lower Body Dressing: Simulated;+1 Total assistance  Where Assessed - Lower Body Dressing: Supported sit to stand  Toilet Transfer: Other (comment);Moderate assistance;Performed (bed to [facility])  Toilet Transfer Method: Stand pivot;Other (comment) (bed to chair)  Toilet Transfer Equipment: (Bed (raised) side step to recliner on his left)  Transfers/Ambulation Related to ADLs: Total A +2 pt=60%  ADL Comments: Pt getting ready to transfers rooms. Pt in good spirits and willing to take a few extra steps to WC.  Cognition:  Cognition  Arousal/Alertness: Awake/alert  Orientation Level: Oriented X4  Cognition  Overall Cognitive Status: Appears within functional limits for tasks assessed/performed  Arousal/Alertness: Awake/alert  Orientation Level: Appears intact for tasks assessed  Behavior During Session: [facility] for tasks performed  Blood pressure 124/57, pulse 62, temperature 98.3 F (36.8 C), temperature source Oral, resp. rate 18, height 6' (1.829 m), weight 96.9 kg (213 lb 10 oz), SpO2 93.00%.    Physical Exam  Nursing note and vitals reviewed.  Constitutional: He is oriented to person, place, and time. He appears  well-developed and well-nourished.  HENT: orla mucosa is moist. Dentition is fair.  Head: Normocephalic and atraumatic.  Neck: Normal range of motion. No jvd or lad. Cardiovascular: Normal rate and regular rhythm. No murmur auscultated Pulmonary/Chest: Effort normal and breath sounds normal. No wheezes or rales Abdominal: Soft. Bowel sounds are normal. He exhibits no distension. There is no tenderness.  Musculoskeletal: He exhibits edema (pedally- RLE 2+ and LLE 1+). Tenderness: right foot.  Neurological: He is alert and oriented to person, place, and time. Strength is 5/5 in the UE. In the LE he is 2-3/5 RLE and 3-4/5 LLE. Sensory exam slightly diminished in right foot but not on left. Pt is a bit tangential but cognitvely appropriate. CN exam is intact. Skin:  Midline incision clean and dry with staples in place. Incision RLE with staples in place. Right forefoot with ischemia--tight and multiple small blisters noted along all digits. Blistering right great toe, 2nd toe and forefoot. Proximal foot is intact and warm. Pulses are 1+. Slight edema noted. LLE is intact.   CBC Status: Abnormal    Collection Time    12/13/11 5:20 AM   Component  Value  Range  Comment    WBC  8.8  4.0 - 10.5 K/uL     RBC  2.59 (*)  4.22 - 5.81 MIL/uL     Hemoglobin  8.2 (*)  13.0 - 17.0 g/dL     HCT  24.0 (*)  39.0 - 52.0 %     MCV  92.7  78.0 - 100.0 fL     MCH  31.7  26.0 - 34.0 pg     MCHC  34.2  30.0 - 36.0 g/dL     RDW  14.4  11.5 - 15.5 %     Platelets  182  150 - 400 K/uL    PROTIME-INR Status: Abnormal    Collection Time    12/13/11 5:20 AM   Component  Value  Range  Comment    Prothrombin Time  28.6 (*)  11.6 - 15.2 seconds     INR  2.64 (*)  0.00 - 1.49    HEPARIN LEVEL (UNFRACTIONATED) Status: Abnormal    Collection Time    12/13/11 5:20 AM   Component  Value  Range  Comment  Heparin Unfractionated  <0.10 (*)  0.30 - 0.70 IU/mL    No results found.  Post Admission Physician Evaluation:   1. Functional deficits secondary to deconditioning after AAA repair and subsequent thromboembolus to the RLE which required thrombectomy of the limb. Pt with ischemia of the right foot. 2. Patient is admitted to receive collaborative, interdisciplinary care between the physiatrist, rehab nursing staff, and therapy team. 3. Patient's level of medical complexity and substantial therapy needs in context of that medical necessity cannot be provided at a lesser intensity of care such as a SNF. 4. Patient has experienced substantial functional loss from his/her baseline which was documented above under the "Functional History" and "Functional Status" headings. Judging by the patient's diagnosis, physical exam, and functional history, the patient has potential for functional progress which will result in measurable gains while on inpatient rehab. These gains will be of substantial and practical use upon discharge in facilitating mobility and self-care at the household level. 5. Physiatrist will provide 24 hour management of medical needs as well as oversight of the therapy plan/treatment and provide guidance as appropriate regarding the interaction of the two. 6. 24 hour rehab nursing will assist with bladder management, bowel management, safety, skin/wound care, disease management, medication administration, pain management and patient education and help integrate therapy concepts, techniques,education, etc. 7. PT will assess and treat for: lower ext strength, pain control, fxnl mobility, adaptive equipment. Goals are: supervision. 8. OT will assess and treat for: upper ext strength, ROM, fxnl mobility, ADL's, pain mgt, safety. Goals are: supervision to minimal assist. 9. SLP will assess and treat for: n.a. . 10. Case Management and Social Worker will assess and treat for psychological issues and discharge planning. 11. Team conference will be held weekly to assess progress toward goals and to determine  barriers to discharge. 12. Patient will receive at least 3 hours of therapy per day at least 5 days per week. 13. ELOS: 7-10 days Prognosis: excellent Medical Problem List and Plan:  1. DVT Prophylaxis/Anticoagulation: Pharmaceutical: Coumadin  2. Pain Management: prn oxycodone effective.  3. Mood: pleasant and appropriate with good outlook. Will have LCSW follow up for formal evaluation.  4. Neuropsych: This patient is capable of making decisions on his/her own behalf.  5. ABLA: H/H slowly improving. Add iron supplement.  6. Acute on chronic renal insufficiency: improving. Recheck in am and continue to monitor renal status. Baseline creatinine 1.88.  7. Hypothyroid: continue supplement. 8. Wound care/ischemia: all operative sites and staples are healing well. He will likely require amputation/debridement of the digits of the right foot.   Oval Linsey, MD

## 2011-12-13 NOTE — Progress Notes (Signed)
ANTICOAGULATION CONSULT NOTE - new consult Pharmacy Consult for Coumadin Indication: s/p embolectomy, thrombectomy with AAA repair  No Known Allergies  Patient Measurements:  wt = 96.9 kg   Vital Signs: Temp: 97.8 F (36.6 C) (08/22 1825) Temp src: Oral (08/22 1825) BP: 132/63 mmHg (08/22 1825) Pulse Rate: 64  (08/22 1825)  Labs:  [name] 12/13/11 0520 12/12/11 0640 12/11/11 0429  HGB 8.2* 8.0* --  HCT 24.0* 23.2* 23.2*  PLT 182 150 119*  APTT -- -- --  LABPROT 28.6* 25.8* 25.3*  INR 2.64* 2.31* 2.26*  HEPARINUNFRC <0.10* <0.10* 0.38  CREATININE -- 2.16* 2.12*  CKTOTAL -- -- --  CKMB -- -- --  TROPONINI -- -- --    The CrCl is unknown because both a height and weight (above a minimum accepted value) are required for this calculation.  Assessment: [name] is known to pharmacy from inpatient dosing of heparin and coumadin s/p embolectomy, thrombectomy with AAA repair.  His  INR remains therapeutic this morning but appears to be trending up to higher end of range.  No bleeding noted.  He has received coumadin 10-10-7.5-4-4 mg doses and 2 mg was given today.  Goal of Therapy:  INR 2-3   Plan:  1. Coumadin 2 mg was given already today at 1724 2. Daily INR for goal 2-3 [name], Pharm.D. [id number] 12/13/2011 7:27 PM

## 2011-12-13 NOTE — Telephone Encounter (Signed)
lvm for patient, dpm

## 2011-12-13 NOTE — Progress Notes (Signed)
ANTICOAGULATION CONSULT NOTE - Follow Up Consult  Pharmacy Consult for Coumadin Indication: s/p embolectomy, thrombectomy with AAA repair  No Known Allergies  Patient Measurements: Height: 6' (182.9 cm) Weight: 213 lb 10 oz (96.9 kg) IBW/kg (Calculated) : 77.6    Vital Signs: Temp: 98.3 F (36.8 C) (08/22 0517) Temp src: Oral (08/22 0517) BP: 124/57 mmHg (08/22 0517) Pulse Rate: 62  (08/22 0517)  Labs:  Basename 12/13/11 0520 12/12/11 0640 12/11/11 0429  HGB 8.2* 8.0* --  HCT 24.0* 23.2* 23.2*  PLT 182 150 119*  APTT -- -- --  LABPROT 28.6* 25.8* 25.3*  INR 2.64* 2.31* 2.26*  HEPARINUNFRC <0.10* <0.10* 0.38  CREATININE -- 2.16* 2.12*  CKTOTAL -- -- --  CKMB -- -- --  TROPONINI -- -- --    Estimated Creatinine Clearance: 32.4 ml/min (by C-G formula based on Cr of 2.16).  Assessment: Patient INR remains therapeutic this morning but appears to be trending up to higher end of range.  No bleeding noted.  Goal of Therapy:  INR 2-3   Plan:  1)  Decrease coumadin dose to 2mg  PO x1 today   Leesha Veno P 12/13/2011,8:41 AM

## 2011-12-13 NOTE — Plan of Care (Signed)
Problem: RH PAIN MANAGEMENT Goal: RH STG PAIN MANAGED AT OR BELOW PT'S PAIN GOAL Pain less than 2

## 2011-12-13 NOTE — Progress Notes (Signed)
Occupational Therapy Treatment Patient Details Name: [name] MRN: [id number] DOB: 1929/01/11 Today's Date: 12/13/2011 Time: [id number] OT Time Calculation (min): 14 min  OT Assessment / Plan / Recommendation Comments on Treatment Session Pt demonstrating improvement in sit to stand and standing tolerance for grooming at sink.    Follow Up Recommendations  Inpatient Rehab    Barriers to Discharge       Equipment Recommendations  3 in 1 bedside comode    Recommendations for Other Services Rehab consult  Frequency Min 2X/week   Plan Discharge plan needs to be updated    Precautions / Restrictions Precautions Precautions: Fall Restrictions Weight Bearing Restrictions: No   Pertinent Vitals/Pain     ADL  Grooming: Performed;Minimal assistance Where Assessed - Grooming: Supported standing Equipment Used: Rolling walker;Gait belt ADL Comments: Modified activity in absence of second person assist.    OT Diagnosis:    OT Problem List:   OT Treatment Interventions:     OT Goals ADL Goals Pt Will Perform Grooming: with set-up;with supervision;Unsupported;Standing at sink ADL Goal: Grooming - Progress: Progressing toward goals  Visit Information  Last OT Received On: 12/13/11    Subjective Data      Prior Functioning       Cognition  Overall Cognitive Status: Appears within functional limits for tasks assessed/performed Arousal/Alertness: Awake/alert Orientation Level: Appears intact for tasks assessed Behavior During Session: [facility] for tasks performed    Mobility Bed Mobility Bed Mobility: Not assessed (pt up in chair) Transfers Transfers: Sit to Stand;Stand to Sit Sit to Stand: 3: Mod assist;With upper extremity assist;From chair/3-in-1 Stand to Sit: 4: Min assist;To chair/3-in-1;With upper extremity assist   Exercises    Balance Balance Balance Assessed: Yes Static Standing Balance Static Standing - Balance Support: Bilateral upper extremity  supported Static Standing - Level of Assistance: 4: Min assist Static Standing - Comment/# of Minutes: approximately 3  End of Session OT - End of Session Activity Tolerance: Patient tolerated treatment well Patient left: in chair  GO     [name] 12/13/2011, 2:07 PM [phone]

## 2011-12-14 ENCOUNTER — Inpatient Hospital Stay (HOSPITAL_COMMUNITY): Payer: Medicare Other | Admitting: Physical Therapy

## 2011-12-14 ENCOUNTER — Inpatient Hospital Stay (HOSPITAL_COMMUNITY): Payer: Medicare Other | Admitting: Occupational Therapy

## 2011-12-14 DIAGNOSIS — Z5189 Encounter for other specified aftercare: Secondary | ICD-10-CM

## 2011-12-14 DIAGNOSIS — I714 Abdominal aortic aneurysm, without rupture: Secondary | ICD-10-CM

## 2011-12-14 DIAGNOSIS — I82509 Chronic embolism and thrombosis of unspecified deep veins of unspecified lower extremity: Secondary | ICD-10-CM

## 2011-12-14 LAB — URINALYSIS, ROUTINE W REFLEX MICROSCOPIC
Leukocytes, UA: NEGATIVE
Nitrite: NEGATIVE
Specific Gravity, Urine: 1.015 (ref 1.005–1.030)
Urobilinogen, UA: 1 mg/dL (ref 0.0–1.0)
pH: 6 (ref 5.0–8.0)

## 2011-12-14 LAB — CBC WITH DIFFERENTIAL/PLATELET
Basophils Absolute: 0 10*3/uL (ref 0.0–0.1)
Basophils Relative: 0 % (ref 0–1)
Eosinophils Absolute: 0.6 10*3/uL (ref 0.0–0.7)
Eosinophils Relative: 5 % (ref 0–5)
Lymphocytes Relative: 16 % (ref 12–46)
MCHC: 33.7 g/dL (ref 30.0–36.0)
MCV: 91.8 fL (ref 78.0–100.0)
Platelets: 282 10*3/uL (ref 150–400)
RDW: 14.3 % (ref 11.5–15.5)
WBC: 11.4 10*3/uL — ABNORMAL HIGH (ref 4.0–10.5)

## 2011-12-14 LAB — COMPREHENSIVE METABOLIC PANEL
ALT: 99 U/L — ABNORMAL HIGH (ref 0–53)
Albumin: 2.5 g/dL — ABNORMAL LOW (ref 3.5–5.2)
Alkaline Phosphatase: 184 U/L — ABNORMAL HIGH (ref 39–117)
BUN: 28 mg/dL — ABNORMAL HIGH (ref 6–23)
Chloride: 106 mEq/L (ref 96–112)
GFR calc Af Amer: 35 mL/min — ABNORMAL LOW (ref >90)
Glucose, Bld: 105 mg/dL — ABNORMAL HIGH (ref 70–99)
Potassium: 3.5 mEq/L (ref 3.5–5.1)
Total Bilirubin: 0.9 mg/dL (ref 0.3–1.2)

## 2011-12-14 LAB — TYPE AND SCREEN: Antibody Screen: NEGATIVE

## 2011-12-14 LAB — URINE MICROSCOPIC-ADD ON

## 2011-12-14 LAB — PROTIME-INR: Prothrombin Time: 31.9 seconds — ABNORMAL HIGH (ref 11.6–15.2)

## 2011-12-14 MED ORDER — PNEUMOCOCCAL VAC POLYVALENT 25 MCG/0.5ML IJ INJ
0.5000 mL | INJECTION | INTRAMUSCULAR | Status: AC
  Administered 2011-12-15: 0.5 mL via INTRAMUSCULAR
  Filled 2011-12-14: qty 0.5

## 2011-12-14 MED ORDER — WARFARIN SODIUM 1 MG PO TABS
1.0000 mg | ORAL_TABLET | Freq: Once | ORAL | Status: AC
  Filled 2011-12-14 (×2): qty 1

## 2011-12-14 NOTE — Progress Notes (Signed)
CSW services were not needed.  Pt d/c to CIR. Nonnie Done, [place] [phone]  Clinical Social Work

## 2011-12-14 NOTE — Progress Notes (Signed)
Inpatient [facility] Individual Statement of Services  Patient Name:  [name]  Date:  12/14/2011  Welcome to the [facility].  Our goal is to provide you with an individualized program based on your diagnosis and situation, designed to meet your specific needs.  With this comprehensive rehabilitation program, you will be expected to participate in at least 3 hours of rehabilitation therapies Monday-Friday, with modified therapy programming on the weekends.  Your rehabilitation program will include the following services:  Physical Therapy (PT), Occupational Therapy (OT), 24 hour per day rehabilitation nursing, Therapeutic Recreaction (TR), Case Management ( Social Worker), Rehabilitation Medicine, Nutrition Services and Pharmacy Services  Weekly team conferences will be held on Tuesdays to discuss your progress.  Your  Social Worker will talk with you frequently to get your input and to update you on team discussions.  Team conferences with you and your family in attendance may also be held.  Expected length of stay: 1 weel  Overall anticipated outcome: modified independent  Depending on your progress and recovery, your program may change.  Your  Social Worker will coordinate services and will keep you informed of any changes.  Your  Social Worker's name and contact numbers are listed  below.  The following services may also be recommended but are not provided by the [facility] will be made to provide these services after discharge if needed.  Arrangements include referral to agencies that provide these services.  Your insurance has been verified to be:  [employer] Your primary doctor is:  Dr. Nadara Mustard  Pertinent information will be shared with your doctor and your insurance company.      Social Worker:  [place], [place] or ([phone]  Information discussed with and copy given to patient by: Lennart Pall, 12/14/2011, 3:50 PM

## 2011-12-14 NOTE — Progress Notes (Signed)
Physical Therapy Note  Patient Details  Name: [name] MRN: [id number] Date of Birth: [birth year] Today's Date: 12/14/2011  Time: 1345-1413 28 minutes  No c/o pain.  Gait training on carpet and tiled surfaces with close supervision with RW.  Pt with increased fatigue with gait on carpet, required more frequent rests.  Seated therex for LE strength and endurance with B LEs.  Pt with good motivation to improve.  Individual therapy   Damichael Hofman 12/14/2011, 2:14 PM

## 2011-12-14 NOTE — Progress Notes (Signed)
Patient information reviewed and entered into [facility] system by Reed Eifert, RN, CRRN, PPS Coordinator.  Information including medical coding and functional independence measure will be reviewed and updated through discharge.     Per nursing patient was given "Data Collection Information Summary for Patients in Inpatient Rehabilitation Facilities with attached "Privacy Act Statement-Health Care Records" upon admission.   

## 2011-12-14 NOTE — Progress Notes (Signed)
Occupational Therapy Session Note  Patient Details  Name: [name] MRN: [id number] Date of Birth: [birth year]  Today's Date: 12/14/2011 Time: [id number] Time Calculation (min): 45 min  Short Term Goals: Week 1:  OT Short Term Goal 1 (Week 1): STG=LTG  Skilled Therapeutic Interventions/Progress Updates:    1:1 Pt's wife brought in clothes for him this pm, focused on donning clothing with assistance for threading underwear and pants over right LE, sit to stand, functional ambulation, shower stall transfer (stepping over the ledge backwards for bilateral UE support on RW), discussed ordering a shower chair for home. Discussed use of reacher to assist with threading pants, discussed importance of keeping feet elevated when resting and MD recommendations about household distances for ambulation only. Wife present for session.  Therapy Documentation Precautions:  Precautions Precautions: Fall Precaution Comments: DARCO shoe on with ambulation, wt bearing Restrictions Weight Bearing Restrictions: No Pain: Pain Assessment Pain Score:   3 Pain Location: Foot Pain Intervention(s): RN made aware  See FIM for current functional status  Therapy/Group: Individual Therapy  Willeen Cass [facility] 12/14/2011, 3:37 PM

## 2011-12-14 NOTE — Evaluation (Signed)
Occupational Therapy Assessment and Plan  Patient Details  Name: [name] MRN: [id number] Date of Birth: 08-Feb-1929  OT Diagnosis: acute pain and swelling of limb Rehab Potential:   ELOS: 10-12days   Today's Date: 12/14/2011 Time: 0730-0825 Time Calculation (min): 55 min  Problem List:  Patient Active Problem List  Diagnosis  . Leaking abdominal aortic aneurysm  . Postoperative respiratory failure  . AAA (abdominal aortic aneurysm)  . Ischemic leg  . CKD  . GERD (gastroesophageal reflux disease)  . CAD (coronary artery disease)  . HTN (hypertension)  . Cardiac enzymes elevated  . Elevated troponin  . Ischemic Foot    Past Medical History:  Past Medical History  Diagnosis Date  . AAA (abdominal aortic aneurysm)   . GERD (gastroesophageal reflux disease)   . Arthritis   . Hypertension   . Chronic kidney disease     Renal insufficiency  . Coronary artery disease   . Hemorrhoid    Past Surgical History:  Past Surgical History  Procedure Date  . Eye surgery ~ 1 year    cataract  . Abdominal aortic aneurysm repair 12/05/2011    Procedure: ANEURYSM ABDOMINAL AORTIC REPAIR;  Surgeon: Mal Misty, MD;  Location: [facility] OR;  Service: Vascular;  Laterality: N/A;  Resection and Grafting of Abdominal Aortic Aneurysm using  18x49mm x 40cm Hemashielpd Gold Vascular Graft  . Artery exploration 12/05/2011    Procedure: ARTERY EXPLORATION;  Surgeon: Mal Misty, MD;  Location: [facility] OR;  Service: Vascular;  Laterality: Right;  Right Popliteal Artery Exploration  . Embolectomy 12/05/2011    Procedure: EMBOLECTOMY;  Surgeon: Mal Misty, MD;  Location: [place];  Service: Vascular;  Laterality: Right;  . Intraoperative arteriogram 12/05/2011    Procedure: INTRA OPERATIVE ARTERIOGRAM;  Surgeon: Mal Misty, MD;  Location: [place];  Service: Vascular;  Laterality: Right;  . Embolectomy 12/05/2011    Procedure: EMBOLECTOMY;  Surgeon: Mal Misty, MD;  Location: [place];  Service:  Vascular;  Laterality: Right;  Thrombectomy of right anterior/posterior Tibial Arterys with vein patch angioplasty of tibial/peroneal trunk.    Assessment & Plan Clinical Impression: Patient is a 78 y.o. year old male AAA who underwent resection and grafting of an infrarenal AAA with insertion of a common iliac graft and exploration of R popliteal artery with embolectomy of posterior tibial and anterior tibial arteries with intraoperative arteriogram of R leg on 12/05/11. 6 hours after the case he lost pulses in the R foot so he was taken back to the OR for thrombectomy of the anterior tibial, posterior tibial, and peroneal arteries. Started on coumadin past surgery and ischemic foot being monitored. Post op with EKG changes with mild elevation in cardiac enzymes likely multifactorial per cardiology. Fluid overload treated with diuretics. Therapies initiated and patient limited by right foot pain and decreased posture.  Patient transferred to CIR on 12/13/2011 .    Patient currently requires min to mod A with basic self-care skills and min A for basic mobility secondary to muscle weakness, muscle joint tightness and swelling in bilateral LEs, decreased cardiorespiratoy endurance and decreased standing balance.  Prior to hospitalization, patient could complete ADLs with mod I .  Patient will benefit from skilled intervention to increase independence with basic self-care skills prior to discharge home with care partner.  Anticipate patient will require intermittent supervision and with IADLs and no further OT follow recommended.  OT - End of Session Activity Tolerance: Tolerates 30+ min activity with multiple  rests OT Plan OT Frequency: 1-2 X/day, 60-90 minutes Estimated Length of Stay: 10-12days OT Treatment/Interventions: Balance/vestibular training;Functional mobility training;Self Care/advanced ADL retraining;Therapeutic Exercise;UE/LE Strength taining/ROM;DME/adaptive equipment instruction;Community  reintegration;Discharge planning;Patient/family education;Psychosocial support;Therapeutic Activities;Splinting/orthotics;UE/LE Coordination activities  OT Evaluation Precautions/Restrictions  Precautions Precautions: Fall Precaution Comments: Darco shoe on with exercise and ambulation Restrictions Weight Bearing Restrictions: No General Chart Reviewed: Yes Family/Caregiver Present: No Vital Signs Therapy Vitals Temp: 99.6 F (37.6 C) Temp src: Oral Pulse Rate: 64  Resp: 18  BP: 112/57 mmHg Patient Position, if appropriate: Lying Oxygen Therapy SpO2: 90 % O2 Device: None (Room air) Pain Pain Assessment Pain Assessment: No/denies pain Home Living/Prior Functioning Home Living Lives With: Spouse Available Help at Discharge: Family Type of Home: House Home Access: Stairs to enter Technical brewer of Steps: 2 Home Layout: One level Bathroom Shower/Tub: Tub/shower unit;Walk-in shower Bathroom Toilet: Standard How Accessible: Accessible via walker Home Adaptive Equipment: None ADL   Vision/Perception  Vision - History Baseline Vision: Wears glasses only for reading Vision - Assessment Eye Alignment: Within Functional Limits Perception Perception: Within Functional Limits Praxis Praxis: Intact  Cognition Overall Cognitive Status: Appears within functional limits for tasks assessed Arousal/Alertness: Awake/alert Orientation Level: Oriented X4 Safety/Judgment: Appears intact Sensation Sensation Light Touch: Impaired Detail Light Touch Impaired Details: Impaired RLE Hot/Cold:  (more senstiive to temperatures now (esp feet)) Coordination Gross Motor Movements are Fluid and Coordinated: Yes Fine Motor Movements are Fluid and Coordinated: Yes Motor  Motor Motor - Skilled Clinical Observations: generalized weakness Mobility  Bed Mobility Supine to Sit: 6: Modified independent (Device/Increase time) Transfers Sit to Stand: 4: Min assist Stand to Sit: 4:  Min assist  Trunk/Postural Assessment  Cervical Assessment Cervical Assessment: Within Functional Limits Thoracic Assessment Thoracic Assessment: Within Functional Limits Lumbar Assessment Lumbar Assessment: Within Functional Limits Postural Control Postural Control: Within Functional Limits  Balance Static Standing Balance Static Standing - Balance Support: During functional activity Static Standing - Level of Assistance: 4: Min assist Extremity/Trunk Assessment RUE Assessment RUE Assessment: Within Functional Limits LUE Assessment LUE Assessment: Within Functional Limits  See FIM for current functional status Refer to Care Plan for Long Term Goals  Recommendations for other services: None  Discharge Criteria: Patient will be discharged from OT if patient refuses treatment 3 consecutive times without medical reason, if treatment goals not met, if there is a change in medical status, if patient makes no progress towards goals or if patient is discharged from hospital.  The above assessment, treatment plan, treatment alternatives and goals were discussed and mutually agreed upon: by patient  1:1 OT eval initiated with OT goals, purpose and role discussed. Self care retraining at sink level with focus on functional mobility with RW with [facility] An boot on ;min A for sit to stand with bilateral UE use; standing balance with and without UE support for basic ADL tasks at sink (bathing and dressing). Pt reports toes feel better with boot on (without weight bearing)  Willeen Cass [facility] 12/14/2011, 8:33 AM

## 2011-12-14 NOTE — Evaluation (Signed)
Physical Therapy Assessment and Plan  Patient Details  Name: [name] MRN: [id number] Date of Birth: Dec 16, 1928  PT Diagnosis: Difficulty walking and Muscle weakness Rehab Potential: Good ELOS: 5-7 days   Today's Date: 12/14/2011 Time: 900-955 55 minutes  Problem List:  Patient Active Problem List  Diagnosis  . Leaking abdominal aortic aneurysm  . Postoperative respiratory failure  . AAA (abdominal aortic aneurysm)  . Ischemic leg  . CKD  . GERD (gastroesophageal reflux disease)  . CAD (coronary artery disease)  . HTN (hypertension)  . Cardiac enzymes elevated  . Elevated troponin  . Ischemic Foot    Past Medical History:  Past Medical History  Diagnosis Date  . AAA (abdominal aortic aneurysm)   . GERD (gastroesophageal reflux disease)   . Arthritis   . Hypertension   . Chronic kidney disease     Renal insufficiency  . Coronary artery disease   . Hemorrhoid    Past Surgical History:  Past Surgical History  Procedure Date  . Eye surgery ~ 1 year    cataract  . Abdominal aortic aneurysm repair 12/05/2011    Procedure: ANEURYSM ABDOMINAL AORTIC REPAIR;  Surgeon: Mal Misty, MD;  Location: [facility] OR;  Service: Vascular;  Laterality: N/A;  Resection and Grafting of Abdominal Aortic Aneurysm using  18x29mm x 40cm Hemashielpd Gold Vascular Graft  . Artery exploration 12/05/2011    Procedure: ARTERY EXPLORATION;  Surgeon: Mal Misty, MD;  Location: [facility] OR;  Service: Vascular;  Laterality: Right;  Right Popliteal Artery Exploration  . Embolectomy 12/05/2011    Procedure: EMBOLECTOMY;  Surgeon: Mal Misty, MD;  Location: [place];  Service: Vascular;  Laterality: Right;  . Intraoperative arteriogram 12/05/2011    Procedure: INTRA OPERATIVE ARTERIOGRAM;  Surgeon: Mal Misty, MD;  Location: [place];  Service: Vascular;  Laterality: Right;  . Embolectomy 12/05/2011    Procedure: EMBOLECTOMY;  Surgeon: Mal Misty, MD;  Location: [place];  Service: Vascular;  Laterality:  Right;  Thrombectomy of right anterior/posterior Tibial Arterys with vein patch angioplasty of tibial/peroneal trunk.    Assessment & Plan Clinical Impression: Patient is a 78 y.o. year old male with recent admission to the hospital who underwent resection and grafting of an infrarenal AAA with insertion of a common iliac graft and exploration of R popliteal artery with embolectomy of posterior tibial and anterior tibial arteries with intraoperative arteriogram of R leg on 12/05/11. 6 hours after the case he lost pulses in the R foot so he was taken back to the OR for thrombectomy of the anterior tibial, posterior tibial, and peroneal arteries. Patient transferred to CIR on 12/13/2011 .   Patient currently requires min with mobility secondary to muscle weakness, decreased cardiorespiratoy endurance and decreased standing balance.  Prior to hospitalization, patient was independent with mobility and lived with Spouse in a House home.  Home access is 2Stairs to enter.  Patient will benefit from skilled PT intervention to maximize safe functional mobility, minimize fall risk and decrease caregiver burden for planned discharge home with intermittent assist.  Anticipate patient will benefit from follow up [facility] at discharge.  PT - End of Session Activity Tolerance: Tolerates 30+ min activity with multiple rests Endurance Deficit: Yes Endurance Deficit Description: needs frequent rests PT Assessment Rehab Potential: Good PT Plan PT Frequency: 1-2 X/day, 60-90 minutes Estimated Length of Stay: 5-7 days PT Treatment/Interventions: Ambulation/gait training;Community reintegration;Balance/vestibular training;Discharge planning;Functional mobility training;Therapeutic Activities;UE/LE Coordination activities;Wheelchair propulsion/positioning;Patient/family education;Pain management;Splinting/orthotics;UE/LE Strength taining/ROM;Therapeutic Exercise;Neuromuscular re-education;DME/adaptive equipment  instruction;Stair training PT Recommendation Follow Up Recommendations: Outpatient PT Equipment Recommended: Wheelchair (measurements)  PT Evaluation Precautions/Restrictions Precautions Precaution Comments: DARCO shoe on with ambulation, wt bearing Restrictions Weight Bearing Restrictions: No  Pain Pain Assessment Pain Score:   3 Pain Location: Foot Pain Intervention(s): RN made aware Home Living/Prior Functioning Home Living Lives With: Spouse Available Help at Discharge: Family Type of Home: House Home Access: Stairs to enter Technical brewer of Steps: 2 Entrance Stairs-Rails: None Home Layout: One level Home Adaptive Equipment: Walker - standard Prior Function Level of Independence: Independent with basic ADLs;Independent with gait;Independent with homemaking with ambulation;Independent with transfers Able to Take Stairs?: Yes Driving: Yes  Cognition Overall Cognitive Status: Appears within functional limits for tasks assessed Sensation Sensation Light Touch: Impaired Detail Light Touch Impaired Details: Impaired RLE Proprioception: Appears Intact Coordination Gross Motor Movements are Fluid and Coordinated: Yes Fine Motor Movements are Fluid and Coordinated: Yes Motor  Motor Motor: Within Functional Limits  Mobility Transfers Sit to Stand: 4: Min assist Sit to Stand Details (indicate cue type and reason): assist for steadying in Darco boot Stand Pivot Transfers: 4: Min assist Stand Pivot Transfer Details (indicate cue type and reason): steadying assist with RW Locomotion  Ambulation Ambulation/Gait Assistance: 4: Min assist Ambulation Distance (Feet): 30 Feet Assistive device: Rolling walker Ambulation/Gait Assistance Details: steadying assist, flexed trunk Stairs / Additional Locomotion Stairs: Yes Stairs Assistance: 4: Min assist Stairs Assistance Details (indicate cue type and reason): cues for safety, technique, steadying assist Stair  Management Technique: Two rails Number of Stairs: 5  Wheelchair Mobility Wheelchair Mobility: Yes Wheelchair Assistance: 5: Careers information officer: Both upper extremities Wheelchair Parts Management: Needs assistance Distance: 150  Trunk/Postural Assessment  Cervical Assessment Cervical Assessment: Within Functional Limits Thoracic Assessment Thoracic Assessment: Within Functional Limits Lumbar Assessment Lumbar Assessment:  (fwd flexed in standing) Postural Control Postural Control: Within Functional Limits  Balance Static Standing Balance Static Standing - Level of Assistance: 4: Min assist Dynamic Standing Balance Dynamic Standing - Level of Assistance: 4: Min assist (with RW) Extremity Assessment      RLE Assessment RLE Assessment: Within Functional Limits (except R ankle limited by swelling) LLE Assessment LLE Assessment: Within Functional Limits  See FIM for current functional status Refer to Care Plan for Long Term Goals  Recommendations for other services: None  Discharge Criteria: Patient will be discharged from PT if patient refuses treatment 3 consecutive times without medical reason, if treatment goals not met, if there is a change in medical status, if patient makes no progress towards goals or if patient is discharged from hospital.  The above assessment, treatment plan, treatment alternatives and goals were discussed and mutually agreed upon: by patient  Treatment initiated during session: Gait training with RW in controlled and household environments with RW with cuing for posture, min A for backward gait, close supervision for obstacle negotiation and turning.  Curb training with RW with min A, cues for safety and technique.  Sit to stand training with focus on activity tolerance, safety and balance.  Darell Saputo 12/14/2011, 3:36 PM

## 2011-12-14 NOTE — Progress Notes (Signed)
ANTICOAGULATION CONSULT NOTE - new consult Pharmacy Consult for Coumadin Indication: s/p embolectomy, thrombectomy with AAA repair  No Known Allergies  Patient Measurements:  wt = 96.9 kg   Vital Signs: Temp: 99.6 F (37.6 C) (08/23 0505) Temp src: Oral (08/23 0505) BP: 112/57 mmHg (08/23 0505) Pulse Rate: 64  (08/23 0505)  Labs:  Basename 12/14/11 0730 12/13/11 0520 12/12/11 0640  HGB 8.7* 8.2* --  HCT 25.8* 24.0* 23.2*  PLT 282 182 150  APTT -- -- --  LABPROT 31.9* 28.6* 25.8*  INR 3.03* 2.64* 2.31*  HEPARINUNFRC -- <0.10* <0.10*  CREATININE -- -- 2.16*  CKTOTAL -- -- --  CKMB -- -- --  TROPONINI -- -- --    The CrCl is unknown because both a height and weight (above a minimum accepted value) are required for this calculation.  Assessment: [name] is known to pharmacy from inpatient dosing of heparin and coumadin s/p embolectomy, thrombectomy with AAA repair.   His  INR remains therapeutic this morning but at high end of goal, likely d/t sensitivity to higher doses given to date. H/H and plts remain stable, no bleeding reported.  Goal of Therapy:  INR 2-3   Plan:  - Coumadin 1mg  PO x 1 today to keep INR from abruptly decreasing with held doses - Will f/up daily INR  Nance Mccombs K. Posey Pronto, PharmD, BCPS.  Clinical Pharmacist Pager [phone]. 12/14/2011 8:45 AM

## 2011-12-14 NOTE — Progress Notes (Signed)
Subjective/Complaints:   Objective: Vital Signs: Blood pressure 112/57, pulse 64, temperature 99.6 F (37.6 C), temperature source Oral, resp. rate 18, SpO2 90.00%. No results found.  Basename 12/14/11 0730 12/13/11 0520  WBC 11.4* 8.8  HGB 8.7* 8.2*  HCT 25.8* 24.0*  PLT 282 182    Basename 12/14/11 0730 12/12/11 0640  NA 142 --  K 3.5 --  CL 106 --  CO2 25 --  GLUCOSE 105* --  BUN 28* --  CREATININE 1.99* 2.16*  CALCIUM 9.0 --   CBG (last 3)  No results found for this basename: GLUCAP:3 in the last 72 hours  Wt Readings from Last 3 Encounters:  12/11/11 96.9 kg (213 lb 10 oz)  12/11/11 96.9 kg (213 lb 10 oz)  12/11/11 96.9 kg (213 lb 10 oz)    Physical Exam:    Nursing note and vitals reviewed.  Constitutional: He is oriented to person, place, and time. He appears well-developed and well-nourished.  HENT: orla mucosa is moist. Dentition is fair.  Head: Normocephalic and atraumatic.  Neck: Normal range of motion. No jvd or lad.  Cardiovascular: Normal rate and regular rhythm. No murmur auscultated  Pulmonary/Chest: Effort normal and breath sounds normal. No wheezes or rales  Abdominal: Soft. Bowel sounds are normal. He exhibits no distension. There is no tenderness.  Musculoskeletal: He exhibits edema (pedally- RLE 2+ and LLE 1+). Tenderness: right foot.  Neurological: He is alert and oriented to person, place, and time. Strength is 5/5 in the UE. In the LE he is 2-3/5 RLE and 3-4/5 LLE. Sensory exam slightly diminished in right foot but not on left. Pt is a bit tangential but cognitvely appropriate. CN exam is intact.  Skin:  Midline incision clean and dry with staples in place. Incision RLE with staples in place. Right forefoot with ischemia--tight and multiple small blisters noted along all digits. Foot with a blood pooling having been up with chair in therapy for a bit.  Blistering right great toe, 2nd toe and forefoot. Proximal foot is intact and warm. Pulses are  1+. Slight edema noted. LLE is intact.    Assessment/Plan: 1. Functional deficits secondary to AAA repair, thromboembolus to the right leg s/p thrombectomy with resulting limb ischemia which require 3+ hours per day of interdisciplinary therapy in a comprehensive inpatient rehab setting. Physiatrist is providing close team supervision and 24 hour management of active medical problems listed below. Physiatrist and rehab team continue to assess barriers to discharge/monitor patient progress toward functional and medical goals. FIM: FIM - Bathing Bathing Steps Patient Completed: Chest;Right Arm;Right upper leg;Left upper leg;Left Arm;Abdomen;Front perineal area;Buttocks;Left lower leg (including foot) Bathing: 4: Min-Patient completes 8-9 16f 10 parts or 75+ percent  FIM - Upper Body Dressing/Undressing Upper body dressing/undressing: 0: Wears gown/pajamas-no public clothing FIM - Lower Body Dressing/Undressing Lower body dressing/undressing: 0: Wears gown/pajamas-no public clothing  FIM - Toileting Toileting steps completed by patient: Adjust clothing prior to toileting;Adjust clothing after toileting;Performs perineal hygiene Toileting: 5: Set-up assist to: Obtain supplies     FIM - Bed/Chair Transfer Bed/Chair Transfer: 4: Chair or W/C > Bed: Min A (steadying Pt. > 75%);4: Bed > Chair or W/C: Min A (steadying Pt. > 75%)  FIM - Locomotion: Wheelchair Locomotion: Wheelchair: 5: Travels 150 ft or more: maneuvers on rugs and over door sills with supervision, cueing or coaxing FIM - Locomotion: Ambulation Locomotion: Ambulation: 1: Travels less than 50 ft with minimal assistance (Pt.>75%)  Comprehension Comprehension Mode: Auditory Comprehension: 6-Follows complex conversation/direction:  With extra time/assistive device  Expression Expression Mode: Verbal Expression: 6-Expresses complex ideas: With extra time/assistive device  Social Interaction Social Interaction: 7-Interacts  appropriately with others - No medications needed.  Problem Solving Problem Solving: 6-Solves complex problems: With extra time  Memory Memory: 6-More than reasonable amt of time  Medical Problem List and Plan:  1. DVT Prophylaxis/Anticoagulation: Pharmaceutical: Coumadin  2. Pain Management: prn oxycodone effective.  3. Mood: pleasant and appropriate with good outlook. Will have LCSW follow up for formal evaluation.  4. Neuropsych: This patient is capable of making decisions on his/her own behalf.  5. ABLA: H/H slowly improving. Add iron supplement.  6. Acute on chronic renal insufficiency: improving. Recheck in am and continue to monitor renal status. Baseline creatinine 1.88.  7. Hypothyroid: continue supplement.  8. Wound care/ischemia: all operative sites and staples are healing well. He will likely require amputation/debridement of the digits of the right foot- conservative care/ protection of limb at this point 9. CKI: follow daily labs 10. Leukocytosis: follow wound which is at risk for infection  -recheck cbc in the am  -ua and culture  LOS (Days) 1 A FACE TO FACE EVALUATION WAS PERFORMED  Rishik Tubby T 12/14/2011, 10:06 AM

## 2011-12-15 ENCOUNTER — Inpatient Hospital Stay (HOSPITAL_COMMUNITY): Payer: Medicare Other | Admitting: Physical Therapy

## 2011-12-15 ENCOUNTER — Inpatient Hospital Stay (HOSPITAL_COMMUNITY): Payer: Medicare Other

## 2011-12-15 LAB — CBC
HCT: 26.2 % — ABNORMAL LOW (ref 39.0–52.0)
MCH: 30.7 pg (ref 26.0–34.0)
MCV: 92.6 fL (ref 78.0–100.0)
Platelets: 326 10*3/uL (ref 150–400)
RDW: 14.4 % (ref 11.5–15.5)

## 2011-12-15 MED ORDER — WARFARIN SODIUM 1 MG PO TABS
1.0000 mg | ORAL_TABLET | Freq: Once | ORAL | Status: AC
  Administered 2011-12-15: 1 mg via ORAL
  Filled 2011-12-15: qty 1

## 2011-12-15 MED ORDER — CEPHALEXIN 500 MG PO CAPS
500.0000 mg | ORAL_CAPSULE | Freq: Three times a day (TID) | ORAL | Status: DC
  Administered 2011-12-15 – 2011-12-19 (×13): 500 mg via ORAL
  Filled 2011-12-15 (×16): qty 1

## 2011-12-15 NOTE — Progress Notes (Signed)
Occupational Therapy Session Note  Patient Details  Name: [name] MRN: [id number] Date of Birth: 12-25-1928  Today's Date: 12/15/2011 Time: [id number] Time Calculation (min): 45 min  Short Term Goals: Week 1:  OT Short Term Goal 1 (Week 1): STG=LTG  Skilled Therapeutic Interventions: ADL-retraining at w/c level with emphasis on dynamic sitting and standing balance, adherence to weight-bearing precaution, sequencing and patient re-ed on adaptations with use of assistive devices (reacher, sock aid).   Patient able to complete upper body bathing and dressing with only setup assist but endorsed need for assistive devices to manage lower body bathing and dressing.   Patient was fully adherent to precautions this session.   Per MD, showers currently still not advised due to possible post-op cellulitis.    Therapy Documentation Precautions:  Precautions Precautions: Fall Precaution Comments: DARCO shoe on with ambulation, wt bearing Restrictions Weight Bearing Restrictions: No  Pain: Pain Assessment Pain Assessment: No/denies pain    See FIM for current functional status  Therapy/Group: Individual Therapy  Maryan Puls 12/15/2011, 12:52 PM

## 2011-12-15 NOTE — Progress Notes (Signed)
Physical Therapy Session Note  Patient Details  Name: [name] MRN: [id number] Date of Birth: 11-Nov-1928  Today's Date: 12/15/2011 Time: 0730-0830 Time Calculation (min): 60 min  Therapy Documentation Precautions:  Precautions Precautions: Fall Precaution Comments: DARCO shoe on with ambulation, wt bearing Restrictions Weight Bearing Restrictions: No  Pain: 4/10 Right foot and lower leg  Therapeutic Activity: (15') Transfer Training sit<->stand and w/c<->bed/mat with S/Mod-I assist Therapeutic Exercise: (15') B LE's in sitting Gait Training: (15') Using RW inside room, bathroom with uneven flooring) and limited distances in hallway with S/Mod-I assist. 4 x 30'. Wheelchair Mobility: (15') S/Independent and able to propel manual w/c 200' on tile and carpeted surfaces.  See FIM for current functional status  Therapy/Group: Individual Therapy  Clearence Ped 12/15/2011, 7:36 AM

## 2011-12-15 NOTE — Progress Notes (Signed)
Physical Therapy Session Note  Patient Details  Name: TALIS GREENLIEF MRN: [id number] Date of Birth: November 16, 1928  Today's Date: 12/15/2011 Time: 1300-1330 Time Calculation (min): 30 min   Therapy Documentation Precautions:  Precautions Precautions: Fall Precaution Comments: DARCO shoe on with ambulation, wt bearing Restrictions Weight Bearing Restrictions: No Pain: Pain Assessment: No/denies pain  Gait Training: (15')  Using RW 2 x 30' inside room to use toilet with uneven flooring/tiles to negotiate with S/Mod-I                                 1 x 150' using RW with S/Mod-I  Wheelchair Management:(15') able to perform w/c management independently including foot rest and brakes management. Propelled w/c 1 x 200' on tile and carpet flooring   See FIM for current functional status  Therapy/Group: Individual Therapy  Tulip Meharg J 12/15/2011, 1:08 PM

## 2011-12-15 NOTE — Progress Notes (Signed)
Physical Therapy Session Note  Patient Details  Name: [name] MRN: [id number] Date of Birth: [birth year]  Today's Date: 12/15/2011 Time: [id number] Time Calculation (min): 29 min during 1 hour group session  Short Term Goals: Week 1:     Skilled Therapeutic Interventions/Progress Updates:    Pt performed standing single limb stance balance activities kicking beach ball with up to min assist for balance loss. NuStep 2 x 5 min level 3, pt encouraged only to flex Rt. LE as comfortable. Obstacle course negotiation + stepping over small obstacle with cues for keeping bil. LEs within RW, overall close supervision. Sit <> stands from slightly elevated mat performed with hands on knees for decreased UE reliance and LE strengthening 2 x 10 reps. All sit <> stands throughout session supervision.   Therapy Documentation Precautions:  Precautions Precautions: Fall Precaution Comments: DARCO shoe on with ambulation, wt bearing Restrictions Weight Bearing Restrictions: No Pain: Pain Assessment Pain Assessment: No/denies pain  See FIM for current functional status  Therapy/Group: Group Therapy, 29 min individualized  Lahoma Rocker 12/15/2011, 5:42 PM

## 2011-12-15 NOTE — Progress Notes (Signed)
Subjective/Complaints: Good night. Pain under control. A 12 point review of systems has been performed and if not noted above is otherwise negative.   Objective: Vital Signs: Blood pressure 128/62, pulse 100, temperature 98.5 F (36.9 C), temperature source Oral, resp. rate 18, SpO2 93.00%. No results found.  Basename 12/14/11 0730 12/13/11 0520  WBC 11.4* 8.8  HGB 8.7* 8.2*  HCT 25.8* 24.0*  PLT 282 182    Basename 12/14/11 0730  NA 142  K 3.5  CL 106  CO2 25  GLUCOSE 105*  BUN 28*  CREATININE 1.99*  CALCIUM 9.0   CBG (last 3)  No results found for this basename: GLUCAP:3 in the last 72 hours  Wt Readings from Last 3 Encounters:  12/11/11 96.9 kg (213 lb 10 oz)  12/11/11 96.9 kg (213 lb 10 oz)  12/11/11 96.9 kg (213 lb 10 oz)    Physical Exam:    Nursing note and vitals reviewed.  Constitutional: He is oriented to person, place, and time. He appears well-developed and well-nourished.  HENT: orla mucosa is moist. Dentition is fair.  Head: Normocephalic and atraumatic.  Neck: Normal range of motion. No jvd or lad.  Cardiovascular: Normal rate and regular rhythm. No murmur auscultated  Pulmonary/Chest: Effort normal and breath sounds normal. No wheezes or rales  Abdominal: Soft. Bowel sounds are normal. He exhibits no distension. There is no tenderness.  Musculoskeletal: He exhibits edema (pedally- RLE 2+ and LLE 1+). Tenderness: right foot.  Neurological: He is alert and oriented to person, place, and time. Strength is 5/5 in the UE. In the LE he is 2-3/5 RLE and 3-4/5 LLE. Sensory exam slightly diminished in right foot but not on left. Pt is a bit tangential but cognitvely appropriate. CN exam is intact.  Skin:  Midline incision clean and dry with staples in place. Incision RLE with staples in place. Right forefoot with ischemia--tight and multiple small blisters noted along all digits. Foot with erythema and warmth proximal to ankle.  Blistering right great toe, 2nd  toe and forefoot. Proximal foot is intact and warm. Pulses are 1+. Slight edema noted. LLE is intact.    Assessment/Plan: 1. Functional deficits secondary to AAA repair, thromboembolus to the right leg s/p thrombectomy with resulting limb ischemia which require 3+ hours per day of interdisciplinary therapy in a comprehensive inpatient rehab setting. Physiatrist is providing close team supervision and 24 hour management of active medical problems listed below. Physiatrist and rehab team continue to assess barriers to discharge/monitor patient progress toward functional and medical goals. FIM: FIM - Bathing Bathing Steps Patient Completed: Chest;Right Arm;Right upper leg;Left upper leg;Left Arm;Abdomen;Front perineal area;Buttocks;Left lower leg (including foot) Bathing: 4: Min-Patient completes 8-9 7f 10 parts or 75+ percent  FIM - Upper Body Dressing/Undressing Upper body dressing/undressing: 0: Wears gown/pajamas-no public clothing FIM - Lower Body Dressing/Undressing Lower body dressing/undressing: 0: Wears gown/pajamas-no public clothing  FIM - Toileting Toileting steps completed by patient: Adjust clothing prior to toileting;Performs perineal hygiene;Adjust clothing after toileting Toileting: 4: Steadying assist  FIM - Radio producer Devices: Elevated toilet seat Toilet Transfers: 4-To toilet/BSC: Min A (steadying Pt. > 75%)  FIM - Bed/Chair Transfer Bed/Chair Transfer Assistive Devices: Copy: 5: Supine > Sit: Supervision (verbal cues/safety issues);5: Sit > Supine: Supervision (verbal cues/safety issues);4: Bed > Chair or W/C: Min A (steadying Pt. > 75%);4: Chair or W/C > Bed: Min A (steadying Pt. > 75%)  FIM - Locomotion: Wheelchair Distance: 150 Locomotion: Wheelchair: 5:  Travels 150 ft or more: maneuvers on rugs and over door sills with supervision, cueing or coaxing FIM - Locomotion: Ambulation Ambulation/Gait Assistance: 4:  Min assist Locomotion: Ambulation: 1: Travels less than 50 ft with minimal assistance (Pt.>75%)  Comprehension Comprehension Mode: Auditory Comprehension: 6-Follows complex conversation/direction: With extra time/assistive device  Expression Expression Mode: Verbal Expression: 6-Expresses complex ideas: With extra time/assistive device  Social Interaction Social Interaction: 6-Interacts appropriately with others with medication or extra time (anti-anxiety, antidepressant).  Problem Solving Problem Solving: 6-Solves complex problems: With extra time  Memory Memory: 6-More than reasonable amt of time  Medical Problem List and Plan:  1. DVT Prophylaxis/Anticoagulation: Pharmaceutical: Coumadin  2. Pain Management: prn oxycodone effective.  3. Mood: pleasant and appropriate with good outlook. Will have LCSW follow up for formal evaluation.  4. Neuropsych: This patient is capable of making decisions on his/her own behalf.  5. ABLA: H/H slowly improving. Add iron supplement.  6. Acute on chronic renal insufficiency: improving. Recheck in am and continue to monitor renal status. Baseline creatinine 1.88.  7. Hypothyroid: continue supplement.  8. Wound care/ischemia: all operative sites and staples are healing well. He will likely require amputation/debridement of the digits of the right foot-  - conservative care/ protection of limb at this point  -begin empiric keflex to cover ? cellulitis 9. CKI: follow daily labs 10. Leukocytosis: follow wound which is at risk for infection  -recheck cbc in the am  -ua negative, culture pending  -keflex for cellulitis  LOS (Days) 2 A FACE TO FACE EVALUATION WAS PERFORMED  SWARTZ,ZACHARY T 12/15/2011, 7:17 AM

## 2011-12-15 NOTE — Progress Notes (Signed)
ANTICOAGULATION CONSULT NOTE - new consult Pharmacy Consult for Coumadin Indication: s/p embolectomy, thrombectomy with AAA repair  No Known Allergies  Patient Measurements:  wt = 96.9 kg   Vital Signs: Temp: 98.5 F (36.9 C) (08/24 0536) Temp src: Oral (08/24 0536) BP: 128/62 mmHg (08/24 0536) Pulse Rate: 100  (08/24 0536)  Labs:  Basename 12/15/11 0705 12/14/11 0730 12/13/11 0520  HGB 8.7* 8.7* --  HCT 26.2* 25.8* 24.0*  PLT 326 282 182  APTT -- -- --  LABPROT 31.6* 31.9* 28.6*  INR 3.00* 3.03* 2.64*  HEPARINUNFRC -- -- <0.10*  CREATININE -- 1.99* --  CKTOTAL -- -- --  CKMB -- -- --  TROPONINI -- -- --    The CrCl is unknown because both a height and weight (above a minimum accepted value) are required for this calculation.  Assessment: [name] is known to pharmacy from inpatient dosing of heparin and coumadin s/p embolectomy, thrombectomy with AAA repair.   His  INR remains therapeutic this morning but at high end of goal, likely d/t sensitivity to higher doses given to date. H/H and plts remain stable, no bleeding reported. Noted Coumadin dose last PM not charted.  Goal of Therapy:  INR 2-3   Plan:  - Repeat Coumadin 1mg  PO x 1 today to keep INR from abruptly decreasing with held doses - Will f/up daily INR  Rozina Pointer K. Posey Pronto, PharmD, BCPS.  Clinical Pharmacist Pager [phone]. 12/15/2011 10:27 AM

## 2011-12-16 ENCOUNTER — Inpatient Hospital Stay (HOSPITAL_COMMUNITY): Payer: Medicare Other | Admitting: Physical Therapy

## 2011-12-16 LAB — CBC
HCT: 24.8 % — ABNORMAL LOW (ref 39.0–52.0)
Hemoglobin: 8.3 g/dL — ABNORMAL LOW (ref 13.0–17.0)
MCH: 31.1 pg (ref 26.0–34.0)
MCHC: 33.5 g/dL (ref 30.0–36.0)
MCV: 92.9 fL (ref 78.0–100.0)

## 2011-12-16 LAB — URINE CULTURE

## 2011-12-16 MED ORDER — WARFARIN SODIUM 2 MG PO TABS
2.0000 mg | ORAL_TABLET | Freq: Once | ORAL | Status: AC
  Administered 2011-12-16: 2 mg via ORAL
  Filled 2011-12-16: qty 1

## 2011-12-16 NOTE — Progress Notes (Signed)
ANTICOAGULATION CONSULT NOTE - new consult Pharmacy Consult for Coumadin Indication: s/p embolectomy, thrombectomy with AAA repair  No Known Allergies  Patient Measurements:  wt = 96.9 kg   Vital Signs: Temp: 98.7 F (37.1 C) (08/25 0625) Temp src: Oral (08/25 0625) BP: 145/54 mmHg (08/25 0625) Pulse Rate: 65  (08/25 0625)  Labs:  Basename 12/16/11 0715 12/15/11 0705 12/14/11 0730  HGB 8.3* 8.7* --  HCT 24.8* 26.2* 25.8*  PLT 330 326 282  APTT -- -- --  LABPROT 30.4* 31.6* 31.9*  INR 2.85* 3.00* 3.03*  HEPARINUNFRC -- -- --  CREATININE -- -- 1.99*  CKTOTAL -- -- --  CKMB -- -- --  TROPONINI -- -- --    The CrCl is unknown because both a height and weight (above a minimum accepted value) are required for this calculation.  Assessment: [name] is known to pharmacy from inpatient dosing of heparin and coumadin s/p embolectomy, thrombectomy with AAA repair.   His  INR is therapeutic this morning. Hx of sensitivity to higher Coumadin doses. H/H and plts remain stable, no bleeding reported. Noted Coumadin dose last PM not charted.  Goal of Therapy:  INR 2-3   Plan:  - Attempt Coumadin 2mg  PO today - Will f/up daily INR  [name], PharmD, BCPS.  Clinical Pharmacist Pager [phone]. 12/16/2011 8:04 AM

## 2011-12-16 NOTE — Progress Notes (Signed)
Subjective/Complaints: Good night. Pain under control. Foot feels a little better. A 12 point review of systems has been performed and if not noted above is otherwise negative.   Objective: Vital Signs: Blood pressure 145/54, pulse 65, temperature 98.7 F (37.1 C), temperature source Oral, resp. rate 20, SpO2 91.00%. No results found.  Basename 12/15/11 0705 12/14/11 0730  WBC 13.1* 11.4*  HGB 8.7* 8.7*  HCT 26.2* 25.8*  PLT 326 282    Basename 12/14/11 0730  NA 142  K 3.5  CL 106  CO2 25  GLUCOSE 105*  BUN 28*  CREATININE 1.99*  CALCIUM 9.0   CBG (last 3)  No results found for this basename: GLUCAP:3 in the last 72 hours  Wt Readings from Last 3 Encounters:  12/11/11 96.9 kg (213 lb 10 oz)  12/11/11 96.9 kg (213 lb 10 oz)  12/11/11 96.9 kg (213 lb 10 oz)    Physical Exam:    Nursing note and vitals reviewed.  Constitutional: He is oriented to person, place, and time. He appears well-developed and well-nourished.  HENT: orla mucosa is moist. Dentition is fair.  Head: Normocephalic and atraumatic.  Neck: Normal range of motion. No jvd or lad.  Cardiovascular: Normal rate and regular rhythm. No murmur auscultated  Pulmonary/Chest: Effort normal and breath sounds normal. No wheezes or rales  Abdominal: Soft. Bowel sounds are normal. He exhibits no distension. There is no tenderness.  Musculoskeletal: He exhibits edema (pedally- RLE 2+ and LLE 1+). Tenderness: right foot.  Neurological: He is alert and oriented to person, place, and time. Strength is 5/5 in the UE. In the LE he is 2-3/5 RLE and 3-4/5 LLE. Sensory exam slightly diminished in right foot but not on left. Pt is a bit tangential but cognitvely appropriate. CN exam is intact.  Skin:  Midline incision clean and dry with staples in place. Incision RLE with staples in place. Right forefoot with ischemia--tight and multiple small blisters noted along all digits. Foot with erythema and warmth proximal to ankle but  appears decreased.  Blistering right great toe, 2nd toe and forefoot. Proximal foot is intact and warm. Pulses are 1+. Slight edema noted. LLE is intact.    Assessment/Plan: 1. Functional deficits secondary to AAA repair, thromboembolus to the right leg s/p thrombectomy with resulting limb ischemia which require 3+ hours per day of interdisciplinary therapy in a comprehensive inpatient rehab setting. Physiatrist is providing close team supervision and 24 hour management of active medical problems listed below. Physiatrist and rehab team continue to assess barriers to discharge/monitor patient progress toward functional and medical goals. FIM: FIM - Bathing Bathing Steps Patient Completed: Chest;Right Arm;Left Arm;Abdomen;Front perineal area;Right upper leg;Left upper leg;Buttocks Bathing: 4: Min-Patient completes 8-9 27f 10 parts or 75+ percent  FIM - Upper Body Dressing/Undressing Upper body dressing/undressing steps patient completed: Thread/unthread right sleeve of pullover shirt/dresss;Thread/unthread left sleeve of pullover shirt/dress;Put head through opening of pull over shirt/dress;Pull shirt over trunk Upper body dressing/undressing: 5: Set-up assist to: Obtain clothing/put away FIM - Lower Body Dressing/Undressing Lower body dressing/undressing steps patient completed: Pull underwear up/down;Pull pants up/down;Don/Doff left shoe;Don/Doff left sock Lower body dressing/undressing: 0: Wears gown/pajamas-no public clothing  FIM - Toileting Toileting steps completed by patient: Adjust clothing prior to toileting;Performs perineal hygiene;Adjust clothing after toileting Toileting: 4: Steadying assist  FIM - Radio producer Devices: Elevated toilet seat Toilet Transfers: 4-To toilet/BSC: Min A (steadying Pt. > 75%)  FIM - Control and instrumentation engineer Devices: Adult nurse  Transfer: 7: Supine > Sit: No assist;6: Bed > Chair or W/C: No  assist  FIM - Locomotion: Wheelchair Distance: 150 Locomotion: Wheelchair: 1: Total Assistance/staff pushes wheelchair (Pt<25%) FIM - Locomotion: Ambulation Locomotion: Ambulation Assistive Devices: Administrator Ambulation/Gait Assistance: 4: Min assist Locomotion: Ambulation: 1: Travels less than 50 ft with minimal assistance (Pt.>75%)  Comprehension Comprehension Mode: Auditory Comprehension: 7-Follows complex conversation/direction: With no assist  Expression Expression Mode: Verbal Expression: 7-Expresses complex ideas: With no assist  Social Interaction Social Interaction: 7-Interacts appropriately with others - No medications needed.  Problem Solving Problem Solving: 5-Solves basic problems: With no assist  Memory Memory: 6-More than reasonable amt of time  Medical Problem List and Plan:  1. DVT Prophylaxis/Anticoagulation: Pharmaceutical: Coumadin  2. Pain Management: prn oxycodone effective.  3. Mood: pleasant and appropriate with good outlook. Will have LCSW follow up for formal evaluation.  4. Neuropsych: This patient is capable of making decisions on his/her own behalf.  5. ABLA: H/H slowly improving. Add iron supplement.  6. Acute on chronic renal insufficiency: improving. Recheck in am and continue to monitor renal status. Baseline creatinine 1.88.  7. Hypothyroid: continue supplement.  8. Wound care/ischemia: all operative sites and staples are healing well. He will likely require amputation/debridement of the digits of the right foot-  - conservative care/ protection of limb at this point, foot appears less erythematous this am.  -continue keflex 9. CKI: follow daily labs 10. Leukocytosis: follow wound which is at risk for infection  -recheck cbc today  -ua negative, culture pending  -keflex for cellulitis  LOS (Days) 3 A FACE TO FACE EVALUATION WAS PERFORMED  Khiara Shuping T 12/16/2011, 7:09 AM

## 2011-12-16 NOTE — Progress Notes (Signed)
Social Work  Social Work Assessment and Plan  Patient Details  Name: [name] MRN: [id number] Date of Birth: 1929/01/23  Today's Date: 12/16/2011  Problem List:  Patient Active Problem List  Diagnosis  . Leaking abdominal aortic aneurysm  . Postoperative respiratory failure  . AAA (abdominal aortic aneurysm)  . Ischemic leg  . CKD  . GERD (gastroesophageal reflux disease)  . CAD (coronary artery disease)  . HTN (hypertension)  . Cardiac enzymes elevated  . Elevated troponin  . Ischemic Foot   Past Medical History:  Past Medical History  Diagnosis Date  . AAA (abdominal aortic aneurysm)   . GERD (gastroesophageal reflux disease)   . Arthritis   . Hypertension   . Chronic kidney disease     Renal insufficiency  . Coronary artery disease   . Hemorrhoid    Past Surgical History:  Past Surgical History  Procedure Date  . Eye surgery ~ 1 year    cataract  . Abdominal aortic aneurysm repair 12/05/2011    Procedure: ANEURYSM ABDOMINAL AORTIC REPAIR;  Surgeon: Mal Misty, MD;  Location: [facility] OR;  Service: Vascular;  Laterality: N/A;  Resection and Grafting of Abdominal Aortic Aneurysm using  18x70mm x 40cm Hemashielpd Gold Vascular Graft  . Artery exploration 12/05/2011    Procedure: ARTERY EXPLORATION;  Surgeon: Mal Misty, MD;  Location: [facility] OR;  Service: Vascular;  Laterality: Right;  Right Popliteal Artery Exploration  . Embolectomy 12/05/2011    Procedure: EMBOLECTOMY;  Surgeon: Mal Misty, MD;  Location: [place];  Service: Vascular;  Laterality: Right;  . Intraoperative arteriogram 12/05/2011    Procedure: INTRA OPERATIVE ARTERIOGRAM;  Surgeon: Mal Misty, MD;  Location: [place];  Service: Vascular;  Laterality: Right;  . Embolectomy 12/05/2011    Procedure: EMBOLECTOMY;  Surgeon: Mal Misty, MD;  Location: [place];  Service: Vascular;  Laterality: Right;  Thrombectomy of right anterior/posterior Tibial Arterys with vein patch angioplasty of tibial/peroneal trunk.     Social History:  reports that he quit smoking about 38 years ago. His smoking use included Cigarettes. He smoked 1 pack per day. He has never used smokeless tobacco. He reports that he does not drink alcohol or use illicit drugs.  Family / Support Systems Marital Status: Married How Long?: 6 years Patient Roles: Customer service manager (employee) Spouse/Significant Other: wife, [name] @ (H) [phone] Children: two adult sons but both out of twon Anticipated Caregiver: wife Ability/Limitations of Caregiver: none Caregiver Availability: 24/7 Family Dynamics: wife very attentive and supportive and denies any concerns about her ability to assist pt upon d/c  Social History Preferred language: English Religion: Christian Cultural Background: NA Education: HS Read: Yes Write: Yes Employment Status: Retired Date Retired/Disabled/Unemployed: "officially 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  Energetic and motivated for therapies with hopes to return home ASAP.  Denies any significant emotional distress and depression screen deferred at this time.  Will monitor for any s/s of depression/ anxiety.   Recent Psychosocial Issues: None Pyschiatric History: None Substance Abuse History: None  Patient / Family Perceptions, Expectations & Goals Pt/Family understanding of illness & functional limitations: Pt and wife with basic understanding of surgery that  was performed on his AAA and his current functional limitations.  Basic understanding of CIR process.  Deny any questions or concerns at this  time. Premorbid pt/family roles/activities: Pt and wife still very active in their community and have all intentions to resume this level as soon as possible.   Anticipated changes in roles/activities/participation: Little change anticipated for the longer term, however, wife prepared to provide any needed caregiver assistance. Pt/family expectations/goals: Pt fully expects to reach an independent functional level "...but just might be a little weak"...  Community Resources [employer]: None Premorbid Home Care/DME Agencies: None Transportation available at discharge: yes  Discharge Planning Living Arrangements: Spouse/significant other Support Systems: Spouse/significant other;Children;Friends/neighbors;Church/faith community Type of Residence: Private residence Insurance Resources: [employer]) Museum/gallery curator Resources: Radio broadcast assistant Screen Referred: No Living Expenses: Own Money Management: Patient Do you have any problems obtaining your medications?: No Home Management: patient and wife Patient/Family Preliminary Plans: Pt plans to return to his home with wife there to assist as much as needed Social Work Anticipated Follow Up Needs: HH/OP Expected length of stay: ELOS 7 to 10 days  Clinical Impression  Pleasant, oriented gentleman here after AAA repair and post-op thrombectomy. Wife at bedside and very supportive.  Anticipate good gains towards independence and short LOS.  No emotional distress noted.  [name] 12/16/2011, 2:36 PM

## 2011-12-16 NOTE — Progress Notes (Signed)
Physical Therapy Note  Patient Details  Name: [name] MRN: [id number] Date of Birth: 03/30/1929 Today's Date: 12/16/2011  1345-1445 (60 minutes) group Pain: no complaint of pain Pt participated in PT group session focused in gait training/safety/endurance. Pt ambulates 60 feet x 2 RW close SBA using Darco shoe RT LE; Nustep Level 3 X 15 minutes (pulse post 15 minutes = 74 ).   Petrita Blunck,JIM 12/16/2011, 8:17 AM

## 2011-12-17 ENCOUNTER — Inpatient Hospital Stay (HOSPITAL_COMMUNITY): Payer: Medicare Other | Admitting: Occupational Therapy

## 2011-12-17 ENCOUNTER — Inpatient Hospital Stay (HOSPITAL_COMMUNITY): Payer: Medicare Other | Admitting: *Deleted

## 2011-12-17 DIAGNOSIS — I714 Abdominal aortic aneurysm, without rupture: Secondary | ICD-10-CM

## 2011-12-17 DIAGNOSIS — I82509 Chronic embolism and thrombosis of unspecified deep veins of unspecified lower extremity: Secondary | ICD-10-CM

## 2011-12-17 DIAGNOSIS — Z5189 Encounter for other specified aftercare: Secondary | ICD-10-CM

## 2011-12-17 LAB — PROTIME-INR: INR: 2.87 — ABNORMAL HIGH (ref 0.00–1.49)

## 2011-12-17 MED ORDER — POLYETHYLENE GLYCOL 3350 17 G PO PACK
17.0000 g | PACK | Freq: Two times a day (BID) | ORAL | Status: DC
  Administered 2011-12-17 – 2011-12-19 (×3): 17 g via ORAL
  Filled 2011-12-17 (×6): qty 1

## 2011-12-17 MED ORDER — WARFARIN SODIUM 1 MG PO TABS
1.0000 mg | ORAL_TABLET | Freq: Once | ORAL | Status: AC
  Administered 2011-12-17: 1 mg via ORAL
  Filled 2011-12-17: qty 1

## 2011-12-17 NOTE — Progress Notes (Signed)
Occupational Therapy Session Note  Patient Details  Name: [name] MRN: [id number] Date of Birth: Feb 03, 1929  Today's Date: 12/17/2011 Time: [id number] Time Calculation (min): 45 min  Short Term Goals: Week 1:  OT Short Term Goal 1 (Week 1): STG=LTG  Skilled Therapeutic Interventions/Progress Updates:    1:1 Focus on functional ambulation navigating through an obstacle course with cones, practicing side stepping through narrow passage ways and avoiding bumping into cones and other people in busy environment; practicing doffing and donning right sock and [employer] boot, discussed with wife and pt d/c plans and crossing thresholds in home with RW, and general strengthening. Pt demonstrated safe use of RW with ambulation with close to distant supervision.  Therapy Documentation Precautions:  Precautions Precautions: Fall Precaution Comments: [employer] shoe on with ambulation, wt bearing Restrictions Weight Bearing Restrictions: No Pain:  no c/o pain  See FIM for current functional status  Therapy/Group: Individual Therapy  Willeen Cass [facility] 12/17/2011, 4:00 PM

## 2011-12-17 NOTE — Progress Notes (Signed)
Occupational Therapy Session Note  Patient Details  Name: [name] MRN: [id number] Date of Birth: May 14, 1928  Today's Date: 12/17/2011 Time: [id number] Time Calculation (min): 45 min  Short Term Goals: Week 1:  OT Short Term Goal 1 (Week 1): STG=LTG  Skilled Therapeutic Interventions/Progress Updates:    1:1 self care retraining at sink level due to MD orders to not shower at this time. Focus on sit to stands, functional ambulation with RW and [facility] boot, using reacher for LB assistance with threading right LE due to lack of ROM in right LE with success, able to don socks using reacher and extra time, activity tolerance, standing balance without pressure through toes, practiced stepping backwards with safe use of the RW with no verbal cues  Therapy Documentation Precautions:  Precautions Precautions: Fall Precaution Comments: [employer] shoe on with ambulation, wt bearing Restrictions Weight Bearing Restrictions: No Pain:  no c/o pain; new blisters on foot and toes on right foot- PA aware - came in and dressed    See FIM for current functional status  Therapy/Group: Individual Therapy  Willeen Cass [facility] 12/17/2011, 7:55 AM

## 2011-12-17 NOTE — Progress Notes (Signed)
Patient ID: [name], male   DOB: 07-Mar-1929, 78 y.o.   MRN: [id number] Vascular Surgery Progress Note  Subjective: 12 days post resection and grafting of abdominal aortic aneurysm with embolus to right lower chest remedy and ischemia. Required 2 embolectomies the right lower extremity on same day. Progressing well in rehabilitation with increasing ambulation. Continuing to watch distal foot demarcate. Patient on chronic Coumadin. Objective:  Filed Vitals:   12/17/11 0912  BP: 130/50  Pulse:   Temp:   Resp:    General alert and oriented x3 Lungs no rhonchi or wheezing Abdominal and right leg incision healing nicely with skin staples in place Right lower extremity with 1-2+ edema. 2-3+ posterior tibial pulse palpable. Some blistering on toes and dorsum of foot. Does not appear to have any deep saline a sore infection. Some motion and sensation in distal foot. Toes beginning to demarcate more clearly  Labs:  Lab 12/14/11 0730 12/12/11 0640 12/11/11 0429  CREATININE 1.99* 2.16* 2.12*    Lab 12/14/11 0730 12/12/11 0640 12/11/11 0429  NA 142 -- 139  K 3.5 -- 3.6  CL 106 -- 105  CO2 25 -- 25  BUN 28* -- 36*  CREATININE 1.99* 2.16* 2.12*  LABGLOM -- -- --  GLUCOSE 105* -- --  CALCIUM 9.0 -- 8.5    Lab 12/16/11 0715 12/15/11 0705 12/14/11 0730  WBC 12.3* 13.1* 11.4*  HGB 8.3* 8.7* 8.7*  HCT 24.8* 26.2* 25.8*  PLT 330 326 282    Lab 12/17/11 0650 12/16/11 0715 12/15/11 0705  INR 2.87* 2.85* 3.00*    I/O last 3 completed shifts: In: 1080 [P.O.:1080] Out: 2550 [Urine:2550]  Imaging: No results found.  Assessment/Plan:  POD #12  LOS: 4 days  s/p   Continues to have slow demarcation of distal right foot following embolization to distal vessels with in situ thrombosis. I have asked Dr. Meridee Score to see patient today to help formulate a plan regarding right foot. Would like to wait as long as possible to allow best level of demarcation. Have discussed this at length  with patient and he understands.   Tinnie Gens, MD 12/17/2011 9:18 AM

## 2011-12-17 NOTE — Progress Notes (Signed)
Physical Therapy Session Note  Patient Details  Name: [name] MRN: [id number] Date of Birth: 1928-10-17  Today's Date: 12/17/2011  1:2 Time: 10:20-11:28 Time Calculation (min): 58 min  Skilled Therapeutic Interventions/Progress Updates:  Tx focused on safe mobility training and Supine and sitting exercise instruction.  Pt S for sit<>stand transfers x5 and S for gait training with RW 2x60' with cues for posture and upward gaze. Pt able to adjust momentarily, but needs ongoing cues to maintain. Adjusted RW height for improved posture.  Sitting LAQ bil, 3# on LLE, 0# on RLE x10 with 5 sec holds, ankle pumps for circulation and ROM Standing Rx10 hip ABD and HS curl. PT with great difficulty with HS curl.  Supine therex for RLE strength and ROM: TKA HEP handout x10 each of the following: Ankle pumps, quad sets, heel slides, SAQ, ADD squeeze, SLR until painful in R thigh, and hip ABD/ADD.  These ex were a good challenge level for pt at this time.   2:2 Time: 13:50-14:35 (18min) Skilled Therapeutic Interventions/Progress Updates: Tx focused on LE strengthening, activity tolerance in sitting and standing, stair training, and standing balance in RW.  Pt performed UE/LE strengthening in Nustep Level 5x53min with no rest breaks, target goal of 60spm averge with good success and VSS throuhgout. HR 64-77bpm and 02 94-98%  Sit<>stand transfers with close S, but pt demonstrating some unsafe techniques with [place] placement and transfering outside of RW. Pt given safety cues and reminded to continue moving slowly and safely even though improving confidence and strength. Serial sit<>stands mat<>RW focusing on safe Dagostino placement 100% of the time.   Stair training with min-guard A and cues for sequence: 5 steps with 2 rails and 5 steps with 1 rail. Pt needing sequence reminders, but able to return-demonstrate proper technique by end of activity.   Standing horseshoe toss with 1 UE assist in RW for  increased standing balance and activity tolerance with close S only needed. Pt able to reach outside BOS in all directions with effective balance strategies.   Gait training back to room ~ 160' with close S and cues for posture. Pt stated his foot felt fine and he would like to complete the walk to room. Pt encouraged to monitor after session.         Therapy Documentation Precautions:  Precautions Precautions: Fall Precaution Comments: [employer]AR[employer] shoe on with ambulation, wt bearing Restrictions Weight Bearing Restrictions: No  Pain: no complaints  Locomotion : Ambulation Ambulation/Gait Assistance: 5: Supervision   See FIM for current functional status  Therapy/Group: Individual Therapy  Soundra Pilon, PT 12/17/2011, 10:49 AM

## 2011-12-17 NOTE — Evaluation (Signed)
Recreational Therapy Assessment and Plan  Patient Details  Name: [name] MRN: [id number] Date of Birth: 10-07-1928 Today's Date: 12/17/2011  Rehab Potential: Good ELOS: 7-10 days   Assessment Clinical Impression: Problem List:  Patient Active Problem List   Diagnosis   .  Leaking abdominal aortic aneurysm   .  Postoperative respiratory failure   .  AAA (abdominal aortic aneurysm)   .  Ischemic leg   .  CKD   .  GERD (gastroesophageal reflux disease)   .  CAD (coronary artery disease)   .  HTN (hypertension)   .  Cardiac enzymes elevated   .  Elevated troponin   .  Ischemic Foot    Past Medical History:  Past Medical History   Diagnosis  Date   .  AAA (abdominal aortic aneurysm)    .  GERD (gastroesophageal reflux disease)    .  Arthritis    .  Hypertension    .  Chronic kidney disease      Renal insufficiency   .  Coronary artery disease    .  Hemorrhoid     Past Surgical History:  Past Surgical History   Procedure  Date   .  Eye surgery  ~ 1 year     cataract   .  Abdominal aortic aneurysm repair  12/05/2011     Procedure: ANEURYSM ABDOMINAL AORTIC REPAIR; Surgeon: Mal Misty, MD; Location: [facility] OR; Service: Vascular; Laterality: N/A; Resection and Grafting of Abdominal Aortic Aneurysm using 18x43mm x 40cm Hemashielpd Gold Vascular Graft   .  Artery exploration  12/05/2011     Procedure: ARTERY EXPLORATION; Surgeon: Mal Misty, MD; Location: [facility] OR; Service: Vascular; Laterality: Right; Right Popliteal Artery Exploration   .  Embolectomy  12/05/2011     Procedure: EMBOLECTOMY; Surgeon: Mal Misty, MD; Location: [place]; Service: Vascular; Laterality: Right;   .  Intraoperative arteriogram  12/05/2011     Procedure: INTRA OPERATIVE ARTERIOGRAM; Surgeon: Mal Misty, MD; Location: [place]; Service: Vascular; Laterality: Right;   .  Embolectomy  12/05/2011     Procedure: EMBOLECTOMY; Surgeon: Mal Misty, MD; Location: [place]; Service: Vascular; Laterality:  Right; Thrombectomy of right anterior/posterior Tibial Arterys with vein patch angioplasty of tibial/peroneal trunk.    Assessment & Plan  Clinical Impression: Patient is a 78 y.o. year old male with recent admission to the hospital who underwent resection and grafting of an infrarenal AAA with insertion of a common iliac graft and exploration of R popliteal artery with embolectomy of posterior tibial and anterior tibial arteries with intraoperative arteriogram of R leg on 12/05/11. 6 hours after the case he lost pulses in the R foot so he was taken back to the OR for thrombectomy of the anterior tibial, posterior tibial, and peroneal arteries. Patient transferred to CIR on 12/13/2011 .   Met with pt and discussed leisure interests and community pursuits.  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  No formal TR treatment plan implemented at this time.  Will monitor through team.  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  If treatment goals not met, if there is a change in medical status, if patient makes no progress towards goals or if patient is discharged from hospital.  The above assessment, treatment plan, treatment alternatives and goals were discussed and mutually agreed upon: by patient  [place] 12/17/2011, 10:16 AM

## 2011-12-17 NOTE — Progress Notes (Signed)
Subjective/Complaints: Good night. Pain under control. Up walking in room. A 12 point review of systems has been performed and if not noted above is otherwise negative.   Objective: Vital Signs: Blood pressure 129/44, pulse 72, temperature 98.8 F (37.1 C), temperature source Oral, resp. rate 18, SpO2 95.00%. No results found.  Basename 12/16/11 0715 12/15/11 0705  WBC 12.3* 13.1*  HGB 8.3* 8.7*  HCT 24.8* 26.2*  PLT 330 326    Basename 12/14/11 0730  NA 142  K 3.5  CL 106  CO2 25  GLUCOSE 105*  BUN 28*  CREATININE 1.99*  CALCIUM 9.0   CBG (last 3)  No results found for this basename: GLUCAP:3 in the last 72 hours  Wt Readings from Last 3 Encounters:  12/11/11 96.9 kg (213 lb 10 oz)  12/11/11 96.9 kg (213 lb 10 oz)  12/11/11 96.9 kg (213 lb 10 oz)    Physical Exam:    Nursing note and vitals reviewed.  Constitutional: He is oriented to person, place, and time. He appears well-developed and well-nourished.  HENT: orla mucosa is moist. Dentition is fair.  Head: Normocephalic and atraumatic.  Neck: Normal range of motion. No jvd or lad.  Cardiovascular: Normal rate and regular rhythm. No murmur auscultated  Pulmonary/Chest: Effort normal and breath sounds normal. No wheezes or rales  Abdominal: Soft. Bowel sounds are normal. He exhibits no distension. There is no tenderness.  Musculoskeletal: He exhibits edema (pedally- RLE 2+ and LLE 1+). Tenderness: right foot.  Neurological: He is alert and oriented to person, place, and time. Strength is 5/5 in the UE. In the LE he is 2-3/5 RLE and 3-4/5 LLE. Sensory exam slightly diminished in right foot but not on left. Pt is a bit tangential but cognitvely appropriate. CN exam is intact.  Skin:  Midline incision clean and dry with staples in place. Incision RLE with staples in place. Right forefoot with ischemia--tight and multiple small blisters noted along all digits. He has a larger blister along dorsum of foot as well. Foot  with erythema and warmth proximal to ankle which is decreased.  Blistering right great toe, 2nd toe and forefoot. Proximal foot is intact and warm. Pulses are 1+. Slight edema noted. LLE is intact.    Assessment/Plan: 1. Functional deficits secondary to AAA repair, thromboembolus to the right leg s/p thrombectomy with resulting limb ischemia which require 3+ hours per day of interdisciplinary therapy in a comprehensive inpatient rehab setting. Physiatrist is providing close team supervision and 24 hour management of active medical problems listed below. Physiatrist and rehab team continue to assess barriers to discharge/monitor patient progress toward functional and medical goals. FIM: FIM - Bathing Bathing Steps Patient Completed: Chest;Right Arm;Left Arm;Abdomen;Front perineal area;Buttocks;Right upper leg;Left upper leg Bathing: 4: Min-Patient completes 8-9 41f 10 parts or 75+ percent  FIM - Upper Body Dressing/Undressing Upper body dressing/undressing steps patient completed: Thread/unthread right sleeve of pullover shirt/dresss;Thread/unthread left sleeve of pullover shirt/dress;Put head through opening of pull over shirt/dress;Pull shirt over trunk Upper body dressing/undressing: 5: Set-up assist to: Obtain clothing/put away FIM - Lower Body Dressing/Undressing Lower body dressing/undressing steps patient completed: Pull underwear up/down;Pull pants up/down;Don/Doff left sock Lower body dressing/undressing: 6: More than reasonable amount of time  FIM - Toileting Toileting steps completed by patient: Adjust clothing prior to toileting;Performs perineal hygiene;Adjust clothing after toileting Toileting Assistive Devices: Grab bar or rail for support Toileting: 4: Steadying assist  FIM - Radio producer Devices: Elevated toilet seat Toilet Transfers:  4-To toilet/BSC: Min A (steadying Pt. > 75%);4-From toilet/BSC: Min A (steadying Pt. > 75%)  FIM - Bed/Chair  Transfer Bed/Chair Transfer Assistive Devices: Copy: 6: Chair or W/C > Bed: No assist  FIM - Locomotion: Wheelchair Distance: 150 Locomotion: Wheelchair: 1: Total Assistance/staff pushes wheelchair (Pt<25%) FIM - Locomotion: Ambulation Locomotion: Ambulation Assistive Devices: Administrator Ambulation/Gait Assistance: 4: Min assist Locomotion: Ambulation: 1: Travels less than 50 ft with minimal assistance (Pt.>75%)  Comprehension Comprehension Mode: Auditory Comprehension: 7-Follows complex conversation/direction: With no assist  Expression Expression Mode: Verbal Expression: 7-Expresses complex ideas: With no assist  Social Interaction Social Interaction: 7-Interacts appropriately with others - No medications needed.  Problem Solving Problem Solving: 5-Solves basic problems: With no assist  Memory Memory: 6-More than reasonable amt of time  Medical Problem List and Plan:  1. DVT Prophylaxis/Anticoagulation: Pharmaceutical: Coumadin  2. Pain Management: prn oxycodone effective.  3. Mood: pleasant and appropriate with good outlook. Will have LCSW follow up for formal evaluation.  4. Neuropsych: This patient is capable of making decisions on his/her own behalf.  5. ABLA: H/H slowly improving. Add iron supplement.  6. Acute on chronic renal insufficiency: . Baseline creatinine 1.88.  7. Hypothyroid: continue supplement.  8. Wound care/ischemia: all operative sites and staples are healing well. He will likely require amputation/debridement of the digits of the right foot-  - conservative care/ protection of limb at this point, foot appears less erythematous this am.  -continue keflex  -surgical follow up 9. CKI: follow daily labs 10. Leukocytosis: follow wound which is at risk for infection  -recheck cbc today  -urine negative  -keflex for cellulitis  LOS (Days) 4 A FACE TO FACE EVALUATION WAS PERFORMED  Laquilla Dault T 12/17/2011, 7:01 AM

## 2011-12-17 NOTE — Progress Notes (Signed)
ANTICOAGULATION CONSULT NOTE - new consult Pharmacy Consult for Coumadin Indication: s/p embolectomy, thrombectomy with AAA repair  No Known Allergies  Patient Measurements:  Wt = 96.9 kg   Vital Signs: Temp: 98.8 F (37.1 C) (08/26 0531) BP: 130/50 mmHg (08/26 0912) Pulse Rate: 72  (08/26 0531)  Labs:  [name] 12/17/11 0650 12/16/11 0715 12/15/11 0705  HGB -- 8.3* 8.7*  HCT -- 24.8* 26.2*  PLT -- 330 326  APTT -- -- --  LABPROT 30.5* 30.4* 31.6*  INR 2.87* 2.85* 3.00*  HEPARINUNFRC -- -- --  CREATININE -- -- --  CKTOTAL -- -- --  CKMB -- -- --  TROPONINI -- -- --    The CrCl is unknown because both a height and weight (above a minimum accepted value) are required for this calculation.  Assessment: [name] is known to pharmacy from inpatient dosing of heparin and coumadin s/p embolectomy, thrombectomy with AAA repair.  His INR remains therapeutic this morning. Hx of sensitivity to higher Coumadin doses. Hgb low but stable, no bleeding reported. Noted Coumadin dose 8/23 not charted.  Goal of Therapy:  INR 2-3   Plan:  - Coumadin 1mg  PO today - Will f/up daily INR - if stable tomorrow, may be able to start daily dose and change INR to T/T/Sat  Sherlon Handing, PharmD, BCPS Clinical pharmacist, pager [phone] 12/17/2011 11:06 AM

## 2011-12-18 ENCOUNTER — Inpatient Hospital Stay (HOSPITAL_COMMUNITY): Payer: Medicare Other | Admitting: Physical Therapy

## 2011-12-18 ENCOUNTER — Inpatient Hospital Stay (HOSPITAL_COMMUNITY): Payer: Medicare Other | Admitting: Occupational Therapy

## 2011-12-18 ENCOUNTER — Inpatient Hospital Stay (HOSPITAL_COMMUNITY): Payer: Medicare Other | Admitting: *Deleted

## 2011-12-18 MED ORDER — SORBITOL 70 % SOLN
45.0000 mL | Freq: Once | Status: AC
  Administered 2011-12-18: 45 mL via ORAL
  Filled 2011-12-18: qty 60

## 2011-12-18 MED ORDER — WARFARIN SODIUM 2 MG PO TABS
2.0000 mg | ORAL_TABLET | Freq: Once | ORAL | Status: AC
  Administered 2011-12-18: 2 mg via ORAL
  Filled 2011-12-18: qty 1

## 2011-12-18 NOTE — Consult Note (Signed)
Reason for Consult: Embolic ischemic event right foot Referring Physician: Dr. Estelle June T [name] is an 78 y.o. male.  HPI: Patient is a 78 year old gentleman who is status post treatment for an abdominal aortic aneurysm. Patient sustained embolic event to the right lower extremity underwent revascularization for the right lower extremity and is seen for evaluation for the ischemic changes to his toes.  Past Medical History  Diagnosis Date  . AAA (abdominal aortic aneurysm)   . GERD (gastroesophageal reflux disease)   . Arthritis   . Hypertension   . Chronic kidney disease     Renal insufficiency  . Coronary artery disease   . Hemorrhoid     Past Surgical History  Procedure Date  . Eye surgery ~ 1 year    cataract  . Abdominal aortic aneurysm repair 12/05/2011    Procedure: ANEURYSM ABDOMINAL AORTIC REPAIR;  Surgeon: Gerald Misty, MD;  Location: [facility] OR;  Service: Vascular;  Laterality: N/A;  Resection and Grafting of Abdominal Aortic Aneurysm using  18x52mm x 40cm Hemashielpd Gold Vascular Graft  . Artery exploration 12/05/2011    Procedure: ARTERY EXPLORATION;  Surgeon: Gerald Misty, MD;  Location: [facility] OR;  Service: Vascular;  Laterality: Right;  Right Popliteal Artery Exploration  . Embolectomy 12/05/2011    Procedure: EMBOLECTOMY;  Surgeon: Gerald Misty, MD;  Location: [place];  Service: Vascular;  Laterality: Right;  . Intraoperative arteriogram 12/05/2011    Procedure: INTRA OPERATIVE ARTERIOGRAM;  Surgeon: Gerald Misty, MD;  Location: [place];  Service: Vascular;  Laterality: Right;  . Embolectomy 12/05/2011    Procedure: EMBOLECTOMY;  Surgeon: Gerald Misty, MD;  Location: [place];  Service: Vascular;  Laterality: Right;  Thrombectomy of right anterior/posterior Tibial Arterys with vein patch angioplasty of tibial/peroneal trunk.    Family History  Problem Relation Age of Onset  . Coronary artery disease Mother   . Kidney disease Mother   . Heart attack Mother   . Stroke  Father   . Hypertension Father     Social History:  reports that he quit smoking about 38 years ago. His smoking use included Cigarettes. He smoked 1 pack per day. He has never used smokeless tobacco. He reports that he does not drink alcohol or use illicit drugs.  Allergies: No Known Allergies  Medications: I have reviewed the patient's current medications.  Results for orders placed during the hospital encounter of 12/13/11 (from the past 48 hour(s))  PROTIME-INR     Status: Abnormal   Collection Time   12/17/11  6:50 AM      Component Value Range Comment   Prothrombin Time 30.5 (*) 11.6 - 15.2 seconds    INR 2.87 (*) 0.00 - 1.49   PROTIME-INR     Status: Abnormal   Collection Time   12/18/11  9:59 AM      Component Value Range Comment   Prothrombin Time 28.0 (*) 11.6 - 15.2 seconds    INR 2.57 (*) 0.00 - 1.49     No results found.  Review of Systems  All other systems reviewed and are negative.   Blood pressure 115/52, pulse 60, temperature 98.4 F (36.9 C), temperature source Oral, resp. rate 18, height 6' (1.829 m), weight 93.4 kg (205 lb 14.6 oz), SpO2 96.00%. Physical Exam On examination patient has a palpable dorsalis pedis pulse the has blistering over the dorsum of the foot with clear serosanguineous blisters as well as ischemic blister changes to the toes. There  is no dry black gangrene but there are ischemic changes to his toes. Assessment/Plan: Ischemic changes right forefoot status post embolic event.  Plan: Patient's foot is warm and well revascularized. There is ischemic changes to the toes but there is no cellulitis abscess or need for urgent intervention. I will continue to follow this conservatively and anticipate it will be several weeks before a decision is necessary for any type of foot salvage surgery. I will followup in the office after patient is discharged.  DUDA,[name] 12/18/2011, 5:56 PM

## 2011-12-18 NOTE — Progress Notes (Signed)
This note has been reviewed and this clinician agrees with information provided.

## 2011-12-18 NOTE — Progress Notes (Signed)
Patient ID: [name], male   DOB: Aug 24, 1928, 78 y.o.   MRN: [id number] I'll f/u after discharge, full dictation to follow, no acute foot surgery needed at this time

## 2011-12-18 NOTE — Progress Notes (Signed)
Social Work Patient ID: [name], male   DOB: 1929-03-27, 78 y.o.   MRN: [id number]  Have reviewed team conference with pt who is pleased with planned d/c tomorrow.  Wife to be here in the morning to complete education prior to d/c.  No concerns at this point.  Gina Costilla

## 2011-12-18 NOTE — Progress Notes (Signed)
Subjective/Complaints: Mobility increasing. Pain controlled A 12 point review of systems has been performed and if not noted above is otherwise negative.   Objective: Vital Signs: Blood pressure 112/54, pulse 65, temperature 98.3 F (36.8 C), temperature source Oral, resp. rate 17, weight 93.4 kg (205 lb 14.6 oz), SpO2 90.00%. No results found.  Basename 12/16/11 0715  WBC 12.3*  HGB 8.3*  HCT 24.8*  PLT 330   No results found for this basename: NA:2,K:2,CL:2,CO2:2,GLUCOSE:2,BUN:2,CREATININE:2,CALCIUM:2 in the last 72 hours CBG (last 3)  No results found for this basename: GLUCAP:3 in the last 72 hours  Wt Readings from Last 3 Encounters:  12/18/11 93.4 kg (205 lb 14.6 oz)  12/11/11 96.9 kg (213 lb 10 oz)  12/11/11 96.9 kg (213 lb 10 oz)    Physical Exam:    Nursing note and vitals reviewed.  Constitutional: He is oriented to person, place, and time. He appears well-developed and well-nourished.  HENT: orla mucosa is moist. Dentition is fair.  Head: Normocephalic and atraumatic.  Neck: Normal range of motion. No jvd or lad.  Cardiovascular: Normal rate and regular rhythm. No murmur auscultated  Pulmonary/Chest: Effort normal and breath sounds normal. No wheezes or rales  Abdominal: Soft. Bowel sounds are normal. He exhibits no distension. There is no tenderness.  Musculoskeletal: He exhibits edema (pedally- RLE 2+ and LLE 1+). Tenderness: right foot.  Neurological: He is alert and oriented to person, place, and time. Strength is 5/5 in the UE. In the LE he is 2-3/5 RLE and 3-4/5 LLE. Sensory exam slightly diminished in right foot but not on left. Pt is a bit tangential but cognitvely appropriate. CN exam is intact.  Skin:  Midline incision clean and dry with staples in place. Incision RLE with staples in place. Right forefoot with ischemia--tight and multiple small blisters noted along all digits. He has a larger blister along dorsum of foot as well. Foot with erythema and  warmth proximal to ankle which is decreased.  Blistering right great toe, 2nd toe and forefoot. Proximal foot is intact and warm. Pulses are 1+. Slight edema noted. LLE is intact.    Assessment/Plan: 1. Functional deficits secondary to AAA repair, thromboembolus to the right leg s/p thrombectomy with resulting limb ischemia which require 3+ hours per day of interdisciplinary therapy in a comprehensive inpatient rehab setting. Physiatrist is providing close team supervision and 24 hour management of active medical problems listed below. Physiatrist and rehab team continue to assess barriers to discharge/monitor patient progress toward functional and medical goals. FIM: FIM - Bathing Bathing Steps Patient Completed: Chest;Right Arm;Left Arm;Abdomen;Front perineal area;Buttocks;Right upper leg;Left upper leg Bathing: 4: Min-Patient completes 8-9 77f 10 parts or 75+ percent  FIM - Upper Body Dressing/Undressing Upper body dressing/undressing steps patient completed: Thread/unthread right sleeve of pullover shirt/dresss;Thread/unthread left sleeve of pullover shirt/dress;Put head through opening of pull over shirt/dress;Pull shirt over trunk Upper body dressing/undressing: 5: Set-up assist to: Obtain clothing/put away FIM - Lower Body Dressing/Undressing Lower body dressing/undressing steps patient completed: Pull underwear up/down;Pull pants up/down;Don/Doff left sock Lower body dressing/undressing: 6: More than reasonable amount of time  FIM - Toileting Toileting steps completed by patient: Adjust clothing prior to toileting;Performs perineal hygiene;Adjust clothing after toileting Toileting Assistive Devices: Grab bar or rail for support Toileting: 4: Steadying assist  FIM - Radio producer Devices: Elevated toilet seat Toilet Transfers: 4-To toilet/BSC: Min A (steadying Pt. > 75%);4-From toilet/BSC: Min A (steadying Pt. > 75%)  FIM - Bed/Chair Transfer Bed/Chair  Transfer Assistive Devices: Arm rests;[name] Bed/Chair Transfer: 5: Bed > Chair or W/C: Supervision (verbal cues/safety issues);5: Chair or W/C > Bed: Supervision (verbal cues/safety issues)  FIM - Locomotion: Wheelchair Distance: 150 Locomotion: Wheelchair: 1: Total Assistance/staff pushes wheelchair (Pt<25%) FIM - Locomotion: Ambulation Locomotion: Ambulation Assistive Devices: Administrator Ambulation/Gait Assistance: 5: Supervision Locomotion: Ambulation: 5: Travels 150 ft or more with supervision/safety issues  Comprehension Comprehension Mode: Auditory Comprehension: 6-Follows complex conversation/direction: With extra time/assistive device  Expression Expression Mode: Verbal Expression: 6-Expresses complex ideas: With extra time/assistive device  Social Interaction Social Interaction: 6-Interacts appropriately with others with medication or extra time (anti-anxiety, antidepressant).  Problem Solving Problem Solving: 5-Solves basic 90% of the time/requires cueing < 10% of the time  Memory Memory: 6-More than reasonable amt of time  Medical Problem List and Plan:  1. DVT Prophylaxis/Anticoagulation: Pharmaceutical: Coumadin  2. Pain Management: prn oxycodone effective.  3. Mood: pleasant and appropriate with good outlook. Will have LCSW follow up for formal evaluation.  4. Neuropsych: This patient is capable of making decisions on his/her own behalf.  5. ABLA: H/H slowly improving. Add iron supplement.  6. Acute on chronic renal insufficiency: . Baseline creatinine 1.88.  7. Hypothyroid: continue supplement.  8. Wound care/ischemia: all operative sites and staples are healing well. He will likely require amputation/debridement of the digits of the right foot-  - conservative care/ protection of limb at this point, foot appears less erythematous this am.  -continue keflex  -Dr. Sharol Given saw the patient today and he will see as an outpt 9. CKI: follow daily labs 10.  Leukocytosis: follow wound which is at risk for infection  -recheck wbc's decreased  -urine negative  -keflex for cellulitis  LOS (Days) 5 A FACE TO FACE EVALUATION WAS PERFORMED  Zendaya Groseclose T 12/18/2011, 7:25 AM

## 2011-12-18 NOTE — Progress Notes (Signed)
Occupational Therapy Session Note  Patient Details  Name: [name] MRN: [id number] Date of Birth: [birth year]  Today's Date: 12/18/2011 Time: [id number] Time Calculation (min): 45 min  Short Term Goals: Week 1:  OT Short Term Goal 1 (Week 1): STG=LTG  Skilled Therapeutic Interventions/Progress Updates:    1:1 self care retraining at sink level. Recommended no shower at this time per MD or have foot completed wrapped (no water on foot at this time), focus on functional ambulation with RW, toilet transfers without use of grab bar for sit to stands, shower stall transfer (dry) with [facility] boot on- gave handout on steps for transfer (backwards)  Therapy Documentation Precautions:  Precautions Precautions: Fall Precaution Comments: DARCO shoe on with ambulation, wt bearing Restrictions Weight Bearing Restrictions: No Pain:  soreness in right foot  See FIM for current functional status  Therapy/Group: Individual Therapy  Willeen Cass [facility] 12/18/2011, 12:23 PM

## 2011-12-18 NOTE — Progress Notes (Signed)
Physical Therapy Note  Patient Details  Name: [name] MRN: [id number] Date of Birth: 04/21/1929 Today's Date: 12/18/2011  Time: 900-957 72 minutes  No c/o pain.  Gait with RW with supervision in controlled and household environments.  Side stepping and backward gait for hip strength and control.  Pt requires close supervision with backward gait.  Stair training with 1 handrail with supervision.  Stair training with RW with assist to steady walker, 1 LOB requiring min A to steady on stairs. Discussed options of RW vs railing for home entry.  Seated therex for B LE strength and endurance with 3# weight L LE, 0# R LE.  Nu step for continued endurance training  X 10 minutes at level 4.  Pt improving activity tolerance and strength.  Individual therapy   DONAWERTH,KAREN 12/18/2011, 9:58 AM

## 2011-12-18 NOTE — Progress Notes (Signed)
Physical Therapy Session Note  Patient Details  Name: [name] MRN: [id number] Date of Birth: [birth year]  Today's Date: 12/18/2011 Time: 11:05-12:05 (78min)  Skilled Therapeutic Interventions/Progress Updates:  Pt reporting mild pain in R foot, feeling he overdid it walking this morning, so foregoing walking this tx and focusing on HEP and sitting/standing balance and activity tolerance.   Pt completed stand-pivot transfers to even and uneven surfaces x6 with S/Mod I, demonstrating safe Freyre placement, brake application, and technique.   Continued HEP instruction, challenging pt to do independently with handout, however he needed frequent cues to not skip ex, technique, and for counting reps. Modified HEP handout somewhat for clarity, added standing HS curls as pt continuing with limited active R knee flexion. Completed each of the following x15 on the R.  Sitting LAQ bil, 3# on LLE, 0# on RLE with 5 sec holds, ankle pumps for circulation and ROM  Standing R hip ABD and HS curl and R marching Supine therex for RLE strength and ROM: TKA HEP handout: Ankle pumps, quad sets, heel slides, SAQ, ADD squeeze, SLR, and hip ABD/ADD.   Standing balance in RW for ball batting task and ball toss, challenging pt in all directions outside BOS with no LOB. Pt given cues for posture in standing, but difficulty maintaining upright posture.   Pt propel WC 2x150 with S/Mod I, able to manage parts with increased time required.         Therapy Documentation Precautions:  Precautions Precautions: Fall Precaution Comments: DARCO shoe on with ambulation, wt bearing Restrictions Weight Bearing Restrictions: No  See FIM for current functional status  Therapy/Group: Individual Therapy  Soundra Pilon, PT 12/18/2011, 11:36 AM

## 2011-12-18 NOTE — Progress Notes (Signed)
ANTICOAGULATION CONSULT NOTE - Follow-Up  Pharmacy Consult for Coumadin Indication: s/p embolectomy, thrombectomy with AAA repair  No Known Allergies  Patient Measurements: Weight: 205 lb 14.6 oz (93.4 kg) Ht 72 in   Vital Signs: Temp: 98.3 F (36.8 C) (08/27 0500) Temp src: Oral (08/27 0500) BP: 112/54 mmHg (08/27 0500) Pulse Rate: 65  (08/27 0500)  Labs:  [name] 12/18/11 0959 12/17/11 0650 12/16/11 0715  HGB -- -- 8.3*  HCT -- -- 24.8*  PLT -- -- 330  APTT -- -- --  LABPROT 28.0* 30.5* 30.4*  INR 2.57* 2.87* 2.85*  HEPARINUNFRC -- -- --  CREATININE -- -- --  CKTOTAL -- -- --  CKMB -- -- --  TROPONINI -- -- --    The CrCl is unknown because both a height and weight (above a minimum accepted value) are required for this calculation.  Assessment: [name] is known to pharmacy from inpatient dosing of heparin and coumadin s/p embolectomy, thrombectomy with AAA repair.  His INR remains therapeutic this morning. Hx of sensitivity to higher Coumadin doses. No bleeding reported. Noted Coumadin dose 8/23 not charted.  Goal of Therapy:  INR 2-3   Plan:  - Coumadin 2mg  PO today - Will f/u INR in a.m. and if stable, change to M/W/F  Sherlon Handing, PharmD, BCPS Clinical pharmacist, pager [phone] 12/18/2011 11:33 AM

## 2011-12-18 NOTE — Progress Notes (Signed)
Occupational Therapy Session Note  Patient Details  Name: [name] MRN: [id number] Date of Birth: [birth year]  Today's Date: 12/18/2011 Time: 2:15-2:45  Short Term Goals: Week 1:  OT Short Term Goal 1 (Week 1): STG=LTG  Skilled Therapeutic Interventions/Progress Updates:    ADL/IADL re-training; Focus on safe functional mobility using RW in preparation to return to private residence requiring one reminder to keep foot inside walker for safety during ambulation; safety and strategies to perform light home-management tasks while using RW including retrieving items from above cabinets, transitioning items from various surfaces, and reaching down to unload dishwasher; educated patient on safety during functional mobility in the home and energy conservation techniques.  Therapy Documentation Precautions:  Precautions Precautions: Fall Precaution Comments: DARCO shoe on with ambulation, wt bearing Restrictions Weight Bearing Restrictions: No Pain:  no c/o pain this date  See FIM for current functional status  Therapy/Group: Individual Therapy  Lilli Few 12/18/2011, 3:31 PM

## 2011-12-19 ENCOUNTER — Inpatient Hospital Stay (HOSPITAL_COMMUNITY): Payer: Medicare Other | Admitting: Occupational Therapy

## 2011-12-19 ENCOUNTER — Inpatient Hospital Stay (HOSPITAL_COMMUNITY): Payer: Medicare Other | Admitting: Physical Therapy

## 2011-12-19 LAB — PROTIME-INR
INR: 2.76 — ABNORMAL HIGH (ref 0.00–1.49)
Prothrombin Time: 29.6 seconds — ABNORMAL HIGH (ref 11.6–15.2)

## 2011-12-19 MED ORDER — WARFARIN SODIUM 1 MG PO TABS
1.0000 mg | ORAL_TABLET | ORAL | Status: DC
  Filled 2011-12-19: qty 1

## 2011-12-19 MED ORDER — WARFARIN SODIUM 2 MG PO TABS: 2.0000 mg | ORAL_TABLET | ORAL | Status: DC

## 2011-12-19 MED ORDER — POLYETHYLENE GLYCOL 3350 17 G PO PACK: 17.0000 g | PACK | Freq: Two times a day (BID) | ORAL | Status: AC

## 2011-12-19 MED ORDER — OXYCODONE HCL 5 MG PO TABS: 5.0000 mg | ORAL_TABLET | Freq: Four times a day (QID) | ORAL | Status: AC | PRN

## 2011-12-19 MED ORDER — TRAMADOL HCL 50 MG PO TABS: 50.0000 mg | ORAL_TABLET | Freq: Four times a day (QID) | ORAL | Status: AC | PRN

## 2011-12-19 MED ORDER — POLYSACCHARIDE IRON COMPLEX 150 MG PO CAPS: 150.0000 mg | ORAL_CAPSULE | Freq: Two times a day (BID) | ORAL | Status: DC

## 2011-12-19 MED ORDER — WARFARIN SODIUM 2 MG PO TABS: ORAL_TABLET | ORAL | Status: DC

## 2011-12-19 MED ORDER — CEPHALEXIN 500 MG PO CAPS: 500.0000 mg | ORAL_CAPSULE | Freq: Three times a day (TID) | ORAL | Status: AC

## 2011-12-19 NOTE — Progress Notes (Signed)
Pt d/c'd to home accompanied by wife and son; verbalizes understanding of D/C instructions given by Algis Liming, PAC; Oxy 5mg  given prior to d/c per request.  Wife and pt educated re: dressing changes to rt foot, s/s infection, increased ischemia. ( HHRN to follow) VSS. [name]

## 2011-12-19 NOTE — Patient Care Conference (Signed)
Inpatient RehabilitationTeam Conference Note Date: 12/18/2011   Time: 3:10 PM     Patient Name: [name]      Medical Record Number: [id number]  Date of Birth: December 21, 1928 Sex: Male         Room/Bed: 4032/4032-01 Payor Info: Payor: [employer] [place] MEDICARE  Plan: BLUE MEDICARE [facility]  Product Type: *No Product type*     Admitting Diagnosis: AAA, RLE thrombectomy  Admit Date/Time:  12/13/2011  6:08 PM Admission Comments: No comment available   Primary Diagnosis:  Ischemic Foot Principal Problem: Ischemic Foot  Patient Active Problem List   Diagnosis Date Noted  . Ischemic Foot 12/13/2011  . Elevated troponin 12/08/2011  . Cardiac enzymes elevated 12/07/2011  . Postoperative respiratory failure 12/06/2011  . AAA (abdominal aortic aneurysm) 12/06/2011  . Ischemic leg 12/06/2011  . CKD 12/06/2011  . GERD (gastroesophageal reflux disease) 12/06/2011  . CAD (coronary artery disease) 12/06/2011  . HTN (hypertension) 12/06/2011  . Leaking abdominal aortic aneurysm 08/14/2011    Expected Discharge Date: Expected Discharge Date: 12/19/11  Team Members Present: Physician: Dr. Alger Simons Social Worker Present: Lennart Pall, LCSW Nurse Present: Elliot Cousin, RN PT Present: Ileana Ladd, PT OT Present: Willeen Cass, OT Other (Discipline and Name): Daiva Nakayama, [place] Coordinator     Current Status/Progress Goal Weekly Team Focus  Medical   thrombectomy after thromboembolus,   pain mgt, rx of wound  see prior, surgery follow up for dc plan   Bowel/Bladder   Continent of bladder and bowel using urinal. Last BM 8/25, 2013.  Miralax 17 g BID/Dulcolax 10 mg PRN  Manage bowel and bladder independlently.To have a BM on a regular basis.  Time toileting q 2 hours.Strict management of bowel regimen.   Swallow/Nutrition/ Hydration             ADL's             Mobility   supervision  mod I  balance, activity tolerance, strength   Communication               Safety/Cognition/ Behavioral Observations            Pain    Oxycodone IR 5-10 mg  (15 q4 hrs PRN/Ultram 50 mg. q 6hrs. PRN  Pain level 3 or less.  assessed effectiveness of intervention and modfy as needed.   Skin   Multiple blisters on the right foot just got bust  No skin breakdown/ infection.  Repositioned the patient float heel/Monitor skin breakdown.    Rehab Goals Patient on target to meet rehab goals: Yes *See Interdisciplinary Assessment and Plan and progress notes for long and short-term goals  Barriers to Discharge: balance, safety    Possible Resolutions to Barriers:  supervision    Discharge Planning/Teaching Needs:  Plan home with wife available to assist as needed.  education planned with wife tomorrow prior to d/c   Team Discussion:  Making excellent gains and ready for d/c tomorrow  Revisions to Treatment Plan:  None   Continued Need for Acute Rehabilitation Level of Care: The patient requires daily medical management by a physician with specialized training in physical medicine and rehabilitation for the following conditions: Daily direction of a multidisciplinary physical rehabilitation program to ensure safe treatment while eliciting the highest outcome that is of practical value to the patient.: Yes Daily medical management of patient stability for increased activity during participation in an intensive rehabilitation regime.: Yes Daily analysis of laboratory values and/or radiology  reports with any subsequent need for medication adjustment of medical intervention for : Post surgical problems;Neurological problems;Other  Nihal Doan 12/19/2011, 4:48 PM

## 2011-12-19 NOTE — Progress Notes (Signed)
Physical Therapy Note  Patient Details  Name: [name] MRN: [id number] Date of Birth: 02-09-1929 Today's Date: 12/19/2011  Time: 1030-1111 41 minutes  No c/o pain.  Gait training controlled and household environments with mod I.  Stair training with 2 handrails x 1 flight with supervision.  Curb training to simulate home entry with supervision, cues for technique.  Nu step for LE/UE strength and endurance level 4 x 10 minutes.  Pt expresses and demos understanding of safety for all transfers, mobility, gait and stair training.  Pt reports he feels comfortable going home at mod I level.  Discussed with pt importance of energy conservation and resting/elevating foot when able.  Pt expresses understanding.  Individual therapy   Bristol Soy 12/19/2011, 10:50 AM

## 2011-12-19 NOTE — Progress Notes (Signed)
Subjective/Complaints: Excited about going home. A 12 point review of systems has been performed and if not noted above is otherwise negative.   Objective: Vital Signs: Blood pressure 103/59, pulse 61, temperature 97.4 F (36.3 C), temperature source Oral, resp. rate 17, height 6' (1.829 m), weight 93.4 kg (205 lb 14.6 oz), SpO2 92.00%. No results found.  Basename 12/16/11 0715  WBC 12.3*  HGB 8.3*  HCT 24.8*  PLT 330   No results found for this basename: NA:2,K:2,CL:2,CO2:2,GLUCOSE:2,BUN:2,CREATININE:2,CALCIUM:2 in the last 72 hours CBG (last 3)  No results found for this basename: GLUCAP:3 in the last 72 hours  Wt Readings from Last 3 Encounters:  12/18/11 93.4 kg (205 lb 14.6 oz)  12/11/11 96.9 kg (213 lb 10 oz)  12/11/11 96.9 kg (213 lb 10 oz)    Physical Exam:    Nursing note and vitals reviewed.  Constitutional: He is oriented to person, place, and time. He appears well-developed and well-nourished.  HENT: orla mucosa is moist. Dentition is fair.  Head: Normocephalic and atraumatic.  Neck: Normal range of motion. No jvd or lad.  Cardiovascular: Normal rate and regular rhythm. No murmur auscultated  Pulmonary/Chest: Effort normal and breath sounds normal. No wheezes or rales  Abdominal: Soft. Bowel sounds are normal. He exhibits no distension. There is no tenderness.  Musculoskeletal: He exhibits edema (pedally- RLE 2+ and LLE 1+). Tenderness: right foot.  Neurological: He is alert and oriented to person, place, and time. Strength is 5/5 in the UE. In the LE he is 2-3/5 RLE and 3-4/5 LLE. Sensory exam slightly diminished in right foot but not on left. Pt is a bit tangential but cognitvely appropriate. CN exam is intact.  Skin:  Midline incision clean and dry with staples in place. Incision RLE with staples in place. Right forefoot with ischemia--tight and multiple small blisters noted along all digits. He has a larger blister along dorsum of foot as well. Foot with  erythema and warmth proximal to ankle which is decreased.  Blistering right great toe, 2nd toe and forefoot. Proximal foot is intact and warm. Pulses are 1+. Slight edema noted. LLE is intact.    Assessment/Plan: 1. Functional deficits secondary to AAA repair, thromboembolus to the right leg s/p thrombectomy with resulting limb ischemia which require 3+ hours per day of interdisciplinary therapy in a comprehensive inpatient rehab setting. Physiatrist is providing close team supervision and 24 hour management of active medical problems listed below. Physiatrist and rehab team continue to assess barriers to discharge/monitor patient progress toward functional and medical goals.  Home today. HHRN, therapies, surgical follow up. Staples out today before dc per [facility] FIM: FIM - Bathing Bathing Steps Patient Completed: Right Arm;Right lower leg (including foot);Chest;Left Arm;Abdomen;Left lower leg (including foot);Front perineal area;Buttocks;Right upper leg;Left upper leg Bathing: 6: More than reasonable amount of time  FIM - Upper Body Dressing/Undressing Upper body dressing/undressing steps patient completed: Thread/unthread right sleeve of pullover shirt/dresss;Thread/unthread left sleeve of pullover shirt/dress;Put head through opening of pull over shirt/dress;Pull shirt over trunk Upper body dressing/undressing: 6: More than reasonable amount of time FIM - Lower Body Dressing/Undressing Lower body dressing/undressing steps patient completed: Thread/unthread right underwear leg;Thread/unthread left underwear leg;Pull underwear up/down;Thread/unthread right pants leg;Thread/unthread left pants leg;Pull pants up/down;Don/Doff right sock;Don/Doff left sock;Don/Doff left shoe;Don/Doff right shoe;Fasten/unfasten right shoe;Fasten/unfasten left shoe Lower body dressing/undressing: 5: Set-up assist to: Obtain clothing  FIM - Toileting Toileting steps completed by patient: Adjust clothing prior to  toileting;Performs perineal hygiene;Adjust clothing after toileting Toileting Assistive Devices:  Grab bar or rail for support Toileting: 6: More than reasonable amount of time  FIM - Radio producer Devices: Elevated toilet seat Toilet Transfers: 6-More than reasonable amt of time  FIM - Control and instrumentation engineer Devices: Copy: 6: Bed > Chair or W/C: No assist;6: Chair or W/C > Bed: No assist  FIM - Locomotion: Wheelchair Distance: 150 Locomotion: Wheelchair: 5: Travels 150 ft or more: maneuvers on rugs and over door sills with supervision, cueing or coaxing FIM - Locomotion: Ambulation Locomotion: Ambulation Assistive Devices: Administrator Ambulation/Gait Assistance: 5: Supervision Locomotion: Ambulation: 0: Activity did not occur  Comprehension Comprehension Mode: Auditory Comprehension: 6-Follows complex conversation/direction: With extra time/assistive device  Expression Expression Mode: Verbal Expression: 6-Expresses complex ideas: With extra time/assistive device  Social Interaction Social Interaction: 6-Interacts appropriately with others with medication or extra time (anti-anxiety, antidepressant).  Problem Solving Problem Solving: 5-Solves basic 90% of the time/requires cueing < 10% of the time  Memory Memory: 6-More than reasonable amt of time  Medical Problem List and Plan:  1. DVT Prophylaxis/Anticoagulation: Pharmaceutical: Coumadin  2. Pain Management: prn oxycodone effective.  3. Mood: pleasant and appropriate with good outlook. Will have LCSW follow up for formal evaluation.  4. Neuropsych: This patient is capable of making decisions on his/her own behalf.  5. ABLA: H/H slowly improving. Add iron supplement.  6. Acute on chronic renal insufficiency: . Baseline creatinine 1.88.  7. Hypothyroid: continue supplement.  8. Wound care/ischemia: all operative sites and staples are healing  well. He will likely require amputation/debridement of the digits of the right foot-  - conservative care/ protection of limb at this point, foot appears less erythematous this am.  -continue keflex  -Dr. Sharol Given saw the patient today and he will see as an outpt 9. CKI: follow daily labs 10. Leukocytosis: follow wound which is at risk for infection  -recheck wbc's decreased  -urine negative  -keflex for cellulitis- would continue for another 7-10 days as his foot and wbc's have responded.  LOS (Days) 6 A FACE TO FACE EVALUATION WAS PERFORMED  [name] T 12/19/2011, 6:55 AM

## 2011-12-19 NOTE — Progress Notes (Signed)
Physical Therapy Discharge Summary  Patient Details  Name: [name] MRN: [id number] Date of Birth: [birth year]  Today's Date: 12/19/2011 Time: [id number] Time Calculation (min): 41 min  Patient has met 7 of 9 long term goals due to improved activity tolerance, improved balance, increased strength, decreased pain and ability to compensate for deficits.  Patient to discharge at an ambulatory level Modified Independent.  Pt able to perform all gait, transfers, stairs with RW at mod I level.  Reasons goals not met: w/c goals no longer appropriate as pt at ambulatory level  Recommendation:  Patient will benefit from ongoing skilled PT services in home health setting to continue to advance safe functional mobility, address ongoing impairments in balance, strength, activity tolerance, and minimize fall risk.  Equipment: w/c  Reasons for discharge: treatment goals met and discharge from hospital  Patient/family agrees with progress made and goals achieved: Yes  PT Discharge Cognition Overall Cognitive Status: Appears within functional limits for tasks assessed Sensation Sensation Light Touch: Impaired Detail Light Touch Impaired Details: Impaired RLE Proprioception: Appears Intact Coordination Gross Motor Movements are Fluid and Coordinated: Yes Fine Motor Movements are Fluid and Coordinated: Yes Motor  Motor Motor: Within Functional Limits   Trunk/Postural Assessment  Cervical Assessment Cervical Assessment: Within Functional Limits Thoracic Assessment Thoracic Assessment: Within Functional Limits Lumbar Assessment Lumbar Assessment: Within Functional Limits Postural Control Postural Control: Within Functional Limits  Balance Static Standing Balance Static Standing - Level of Assistance: 7: Independent Dynamic Standing Balance Dynamic Standing - Level of Assistance: 6: Modified independent (Device/Increase time) Extremity Assessment      RLE Assessment RLE Assessment:  Within Functional Limits LLE Assessment LLE Assessment: Within Functional Limits  See FIM for current functional status  Ozzie Knobel 12/19/2011, 11:15 AM

## 2011-12-19 NOTE — Progress Notes (Signed)
Occupational Therapy Discharge Summary  Patient Details  Name: [name] MRN: [id number] Date of Birth: [birth year]  Today's Date: 12/19/2011 Time: [id number] Time Calculation (min): 45 min  1:1 GRAD DAY: self care retraining at sink level : focus on pt demonstrating safe use of RW with functional mobility, performing bathing and dressing at mod I at sink level, able to retreive belongs from [place] with safe use of RW, able to verbalize current care for right foot and the importance of elevation. Plan to return when wife present to demonstrate how to wrap foot for shower.  Patient has met 9 of 9 long term goals due to improved activity tolerance, improved balance and improved overall strength and endurance.  Patient to discharge at overall Modified Independent level with RW for functional ambulation and with [facility] boot. Pt recommended to have supervision for transfer into shower stall with right foot covered. Also recommended pt wear [facility] boot when transferring in and out of shower. Pt still recommended to only ambulate household distances, so no outside IADLs at this time.  Patient's care partner is independent to provide the necessary IADLs assistance at discharge.    Reasons goals not met: n/a  Recommendation:  Patient will benefit from ongoing skilled OT services in home health setting to continue to advance functional skills in the area of iADL and transition to home environment and for safety eval.  Equipment: shower chair  Reasons for discharge: treatment goals met and discharge from hospital  Patient/family agrees with progress made and goals achieved: Yes  OT Discharge Precautions/Restrictions  Precautions Precaution Comments: DAR[employer] shoe on right foot with ambulation and for weight bearing through right LE Restrictions Weight Bearing Restrictions: Yes Other Position/Activity Restrictions: not through right toes Pain  on going soreness in right foot ADL  see  FIM Vision/Perception  Vision - History Baseline Vision: Wears glasses only for reading Patient Visual Report: No change from baseline Vision - Assessment Eye Alignment: Within Functional Limits Additional Comments: left eye bloodshot- RN/ PA aware Perception Perception: Within Functional Limits Praxis Praxis: Intact  Cognition Overall Cognitive Status: Appears within functional limits for tasks assessed Arousal/Alertness: Awake/alert Orientation Level: Oriented X4 Safety/Judgment: Appears intact Sensation Sensation Light Touch: Impaired Detail Light Touch Impaired Details: Impaired RLE Hot/Cold:  (more senstiive to temperatures now (esp feet)) Proprioception: Appears Intact Coordination Gross Motor Movements are Fluid and Coordinated: Yes Fine Motor Movements are Fluid and Coordinated: Yes Motor  Motor Motor: Within Functional Limits Mobility  Bed Mobility Supine to Sit: 7: Independent Sit to Supine: 7: Independent Transfers Sit to Stand: 6: Modified independent (Device/Increase time) Stand to Sit: 6: Modified independent (Device/Increase time)  Trunk/Postural Assessment  Cervical Assessment Cervical Assessment: Within Functional Limits Thoracic Assessment Thoracic Assessment: Within Functional Limits Lumbar Assessment Lumbar Assessment: Within Functional Limits Postural Control Postural Control: Within Functional Limits  Balance Static Standing Balance Static Standing - Level of Assistance: 6: Modified independent (Device/Increase time) Dynamic Standing Balance Dynamic Standing - Level of Assistance: 6: Modified independent (Device/Increase time) Extremity/Trunk Assessment RUE Assessment RUE Assessment: Within Functional Limits LUE Assessment LUE Assessment: Within Functional Limits  See FIM for current functional status  Willeen Cass [facility] 12/19/2011, 1:29 PM

## 2011-12-19 NOTE — Progress Notes (Signed)
Social Work  Discharge Note  The overall goal for the admission was met for:   Discharge location: Yes - home with wife  Length of Stay: Yes - 6 days  Discharge activity level: Yes - modified independent  Home/community participation: Yes  Services provided included: MD, RD, PT, OT, RN, TR, Pharmacy and [place]: Medicare  Follow-up services arranged: Home Health: RN, PT, OT via [place], DME: [employer] [place] w/c with ELR, cushion, rolling walker and tub seat via [place] and Patient/Family has no preference for HH/DME agencies  Comments (or additional information):  Patient/Family verbalized understanding of follow-up arrangements: Yes  Individual responsible for coordination of the follow-up plan: patient  Confirmed correct DME delivered: Jaedan Huttner 12/19/2011    Nasario Czerniak

## 2011-12-19 NOTE — Progress Notes (Signed)
ANTICOAGULATION CONSULT NOTE - Follow-Up  Pharmacy Consult for Coumadin Indication: s/p embolectomy, thrombectomy with AAA repair  No Known Allergies  Patient Measurements: Height: 6' (182.9 cm) Weight: 205 lb 14.6 oz (93.4 kg) IBW/kg (Calculated) : 77.6  Ht 72 in   Vital Signs: Temp: 97.4 F (36.3 C) (08/28 0500) Temp src: Oral (08/28 0500) BP: 103/59 mmHg (08/28 0500) Pulse Rate: 61  (08/28 0500)  Labs:  [name] 12/19/11 0615 12/18/11 0959 12/17/11 0650  HGB -- -- --  HCT -- -- --  PLT -- -- --  APTT -- -- --  LABPROT 29.6* 28.0* 30.5*  INR 2.76* 2.57* 2.87*  HEPARINUNFRC -- -- --  CREATININE -- -- --  CKTOTAL -- -- --  CKMB -- -- --  TROPONINI -- -- --    Estimated Creatinine Clearance: 34.5 ml/min (by C-G formula based on Cr of 1.99).  Assessment: [name] is known to pharmacy from inpatient dosing of heparin and coumadin s/p embolectomy, thrombectomy with AAA repair.  His INR remains therapeutic this morning. Hx of sensitivity to higher Coumadin doses. No bleeding reported.   Goal of Therapy:  INR 2-3   Plan:  -  Coumadin 1mg  PO today and then alternating 2 mg every other day with 1 mg - Will change INR to M/W/F  Sherlon Handing, PharmD, BCPS Clinical pharmacist, pager [phone] 12/19/2011 10:23 AM

## 2011-12-20 NOTE — Discharge Summary (Signed)
NAME:  [name], [name]                   ACCOUNT NO.:  [id number]  MEDICAL RECORD NO.:  [id number]  LOCATION:  [id number]                         FACILITY:  [facility]  PHYSICIAN:  Meredith Staggers, M.D.DATE OF BIRTH:  [birth year]  DATE OF ADMISSION:  12/13/2011 DATE OF DISCHARGE:  12/19/2011                              DISCHARGE SUMMARY   DISCHARGE DIAGNOSES: 1. Deconditioning due to abdominal aortic aneurysm repair and     subsequent thromboembolization of right lower extremity. 2. Right lower extremity thrombectomy, now on Coumadin. 3. Acute blood loss anemia. 4. Acute on chronic renal insufficiency, improved.  HISTORY OF PRESENT ILLNESS:  Patient is an 78 year old male with history of hypertension, GERD, AAA, who underwent resection and grafting of infrarenal AAA with insertion of common iliac graft and exploration of right popliteal artery with embolectomy of posterior tibial and anterior tibial arteries of right lower extremity on August 14.  Six hours past procedure, patient lost pulse in his right foot and was taken back to OR for thrombectomy of anterior tibial and posterior tibial and peroneal arteries.  He was started on Coumadin past surgery and his ischemic foot is being monitored for changes.  The patient had EKG changes with mild elevation in cardiac enzymes and Dr. Burt Knack was consulted for input.  He felt these were multifactorial in nature.  Problems with fluid overload were treated with diuretics. Therapies were initiated and the patient was limited by right foot pain as well as decreased posture.  He was evaluated by therapy team and rehab was recommended for progression.  PAST MEDICAL HISTORY:  Positive for AAA, GERD, arteritis, hypertension, chronic kidney disease, coronary artery disease, and hemorrhoids.  FUNCTIONAL HISTORY:  The patient was independent prior to admission.  He was still working 2 days a week, delivering meds for a Mining engineer. He did not use an assistive  device.  FUNCTIONAL STATUS:  The patient was min-to-mod assist bed mobility.  Mod assist for sit-to-stand transfers.  He was able to ambulate 40 feet with a rolling walker with mod assist.  He required min assist for upper body bathing, max assist for lower body dressing, max assist for upper body dressing, and moderate assist for toileting.  HOSPITAL COURSE:  This patient was admitted to rehab on December 13, 2011, for inpatient therapies to consist of PT/OT at least 3 hours 5 days a week.  Past admission, physiatrist, rehab, RN, and therapy team have worked together to provide customized collaborative interdisciplinary care.  Rehab RN has worked with the patient on bowel and bladder program as well as the wound monitoring.  Labs were checked past surgery for follow up on his renal insufficiency.  His creatinine was noted to be improved from 2.16 to 1.99.  CBC done revealed H and H at 8.7 and 25.8. He was noted to have mild leukocytosis.  Foot was noted to be erythematous partly due to ischemia.  He had multiple fluctuant blisters on his right foot and his ischemic toes. Blister on his right great toe ruptured with beefy red tissue noted. He was started on Keflex for wound prophylaxis and is to continue on this for sn additional weeks past  discharge. The patient's blood pressures were monitored on b.i.d. basis during this stay.  These were reasonably controlled.  From [id number] systolic, diastolic has ranged from 0000000 range.  The patient's p.o. intake has been good.  He has been continent of bowel and bladder. PT/INR has been monitored along with pharmacist assistance.  PT at time of discharge is at 29.6.  INR is 2.76, and the patient was discharged on 1 mg Coumadin alternating with 2 every other day.  Pain control has been reasonable with the use of p.r.n. oxycodone.  During the patient's stay in rehab, weekly team conferences were held to monitor the patient's progress, set goals as well as  discussed barriers to discharge.  PT has been working the patient on mobility, strengthening as well as balance.  The patient has progressed to being at modified independent level for transfers and mobility.  He is able to navigate one flight of stairs with supervision.  He requires supervision for home entry and curb training.  Family education was done with wife who will be able to assist as needed past discharge.  OT has worked with the patient on self-care tasks.  The patient is showing improvement in activity tolerance, strength, as well as endurance.  The patient is modified independent for ADLs and light home management tasks.  Dr. Sharol Given was consulted for followup on patient's ischemic toes.  He felt that Skin changes were consistent with ischemic changes but no cellulitis or abscess noted.  He felt that there was no need for urgent intervention.  He recommended conservative care with followup and decision regarding needs for any foot salvage Surgery to be made in the future.  On December 19, 2011, the patient is discharged to home in improved condition.  DISCHARGE MEDICATIONS: 1. Keflex 500 mg p.o. every 8 hours. 2. Niferex 150 mg p.o. b.i.d. 3. OxyIR 5 mg one-two p.o. every 6 hours p.r.n. moderate-to-severe     pain #60 Rx. 4. MiraLax 17 g and 8 ounces p.o. b.i.d. 5. Tramadol 50 mg one p.o. every 6 hours p.r.n. mild pain. 6. Coumadin 2 mg, start with a half pill today and alternate a whole     with half every other day. 7. Aspirin 81 mg a day. 8. Fish oil, one tab daily. 9. Synthroid 150 mcg p.o. per day. 10. Lopressor 25 mg b.i.d. 11. Multivitamin one per day. 12. Zantac 150 mg p.o. b.i.d.  DIET:  Regular.  ACTIVITY:  Level is as tolerated with intermittent supervision.  Walks with a walker.  SPECIAL INSTRUCTIONS:  [facility] to provide PT, OT, and RN. Wound care, keep dry dressing on left foot.  Continue foam dressing to blistered areas on the right foot.  FOLLOWUP:   The patient to follow up with Dr. Kellie Simmering for postop check on December 25, 2011, at 1:30 p.m.  Follow up with Dr. Burt Knack as needed. Follow up with Dr. Naaman Plummer as needed.  Follow up with Dr. Meridee Score for appointment in 2 weeks.  Follow up with Dr. Rory Percy on December 28, 2011, at 9:45 a.m.     Reesa Chew, P.A.   ______________________________ Meredith Staggers, M.D.    PL/MEDQ  D:  12/20/2011  T:  12/20/2011  Job:  [id number]  cc:   Nelda Severe. Kellie Simmering, M.D. Juanda Bond. Burt Knack, MD Newt Minion, MD Rory Percy, MD

## 2011-12-21 ENCOUNTER — Encounter: Payer: Self-pay | Admitting: Vascular Surgery

## 2011-12-25 ENCOUNTER — Encounter: Payer: Self-pay | Admitting: Vascular Surgery

## 2011-12-25 ENCOUNTER — Ambulatory Visit (INDEPENDENT_AMBULATORY_CARE_PROVIDER_SITE_OTHER): Payer: Medicare Other | Admitting: Vascular Surgery

## 2011-12-25 VITALS — BP 158/67 | HR 66 | Temp 98.1°F | Ht 72.0 in | Wt 200.0 lb

## 2011-12-25 DIAGNOSIS — Z48812 Encounter for surgical aftercare following surgery on the circulatory system: Secondary | ICD-10-CM

## 2011-12-25 DIAGNOSIS — I714 Abdominal aortic aneurysm, without rupture: Secondary | ICD-10-CM

## 2011-12-25 NOTE — Progress Notes (Signed)
Subjective:     Patient ID: [name], male   DOB: 1929/02/26, 78 y.o.   MRN: [id number]  HPI this 78 year old male is now 3 weeks post resection and grafting of abdominal aortic aneurysm with insertion aortobiiliac graft. This was complicated by an intraoperative embolus to the right leg into the tibial vessels causing severe ischemia of the right leg requiring 2 subsequent operations with Fogarty thrombectomies and vein patch angioplasty of the tibioperoneal trunk with several intraoperative arteriograms. He has been on Coumadin therapy since his discharge from the hospital almost 2 weeks ago. He has had severe ischemia of the distal right foot which has been gradually improving. He is being followed by Dr. Meridee Score. The color of the right foot continues to improve as does his discomfort. He works on range of motion at home. He continues to have a poor appetite. Otherwise he is progressing nicely ambulating with a walker.  Past Medical History  Diagnosis Date  . AAA (abdominal aortic aneurysm)   . GERD (gastroesophageal reflux disease)   . Arthritis   . Hypertension   . Chronic kidney disease     Renal insufficiency  . Coronary artery disease   . Hemorrhoid     History  Substance Use Topics  . Smoking status: Former Smoker -- 1.0 packs/day    Types: Cigarettes    Quit date: 07/22/1973  . Smokeless tobacco: Never Used  . Alcohol Use: No    Family History  Problem Relation Age of Onset  . Coronary artery disease Mother   . Kidney disease Mother   . Heart attack Mother   . Stroke Father   . Hypertension Father     No Known Allergies  Current outpatient prescriptions:aspirin 81 MG tablet, Take 81 mg by mouth daily., Disp: , Rfl: ;  cephALEXin (KEFLEX) 500 MG capsule, Take 1 capsule (500 mg total) by mouth every 8 (eight) hours. Antibiotics for right foot, Disp: 21 capsule, Rfl: 0;  fish oil-omega-3 fatty acids 1000 MG capsule, Take 2 g by mouth daily., Disp: , Rfl:  iron  polysaccharides (NIFEREX) 150 MG capsule, Take 1 capsule (150 mg total) by mouth 2 times daily at 12 noon and 4 pm. For anemia, Disp: 60 capsule, Rfl: 1;  levothyroxine (SYNTHROID, LEVOTHROID) 150 MCG tablet, Take 150 mcg by mouth daily., Disp: , Rfl: ;  metoprolol tartrate (LOPRESSOR) 25 MG tablet, Take 1 tablet (25 mg total) by mouth 2 (two) times daily., Disp: 60 tablet, Rfl: 2 Multiple Vitamins-Minerals (MULTIVITAMIN WITH MINERALS) tablet, Take 1 tablet by mouth daily., Disp: , Rfl: ;  oxyCODONE (OXY IR/ROXICODONE) 5 MG immediate release tablet, Take 1-2 tablets (5-10 mg total) by mouth every 6 (six) hours as needed. For moderate to severe pain, Disp: 60 tablet, Rfl: 0;  ranitidine (ZANTAC) 150 MG tablet, Take 150 mg by mouth 2 (two) times daily., Disp: , Rfl:  traMADol (ULTRAM) 50 MG tablet, Take 1 tablet (50 mg total) by mouth every 6 (six) hours as needed. Use as needed for mild pain., Disp: 45 tablet, Rfl: 1;  warfarin (COUMADIN) 2 MG tablet, Alternate a whole pill with half pill every other day. Take in evenings and start with 1/2 a pill today., Disp: 45 tablet, Rfl: 1  BP 158/67  Pulse 66  Temp 98.1 F (36.7 C) (Oral)  Ht 6' (1.829 m)  Wt 200 lb (90.719 kg)  BMI 27.12 kg/m2  SpO2 98%  Body mass index is 27.12 kg/(m^2).  Review of Systems     Objective:   Physical Exam pressure 150/67 heart rate 66 respirations 18 General well-developed well-nourished male no apparent stress alert and oriented x3 Abdomen soft nontender. Midline incision well healed. Lower extremities with 3+ femoral pulses bilaterally. Right leg has 1-2+ edema lower third of the leg with a 2-3+ posterior tibial pulse palpable. There is blistering on the tips of all 5 toes. The darkness on the toes is on the very tip and the entire plantar surface of the foot appears to be viable. Toes may all be viable with the exception of the tips. Left foot has a 3+ dorsalis pedis pulse and 1+ edema.     Assessment:       Status post abdominal aortic aneurysm resection with complication of ischemia to the right lower extremity due to intraoperative embolus requiring 2 surgical procedures including thrombectomy and vein patch angioplasty right tibioperoneal trunk. Right foot continues to improve significantly with some gangrene at the tips of all 5 toes and blistering but improvement in proximal tissue perfusion-I am very encouraged by progress    Plan:     Continue local wound care right foot and outpatient physical therapy with increasing ambulation. Patient will see Dr.Duda next week. Coumadin being followed by Dr. Rory Percy Return to see me in 4 weeks

## 2011-12-26 NOTE — Addendum Note (Signed)
Addended by: Mena Goes on: 12/26/2011 10:43 AM   Modules accepted: Orders

## 2012-01-18 ENCOUNTER — Telehealth: Payer: Self-pay | Admitting: Physical Medicine & Rehabilitation

## 2012-01-18 NOTE — Telephone Encounter (Signed)
Left message for them to [facility] since VM was not identifiable with the original caller.

## 2012-01-18 NOTE — Telephone Encounter (Signed)
Needs order for 1wk/2 PT

## 2012-01-21 ENCOUNTER — Encounter: Payer: Self-pay | Admitting: Vascular Surgery

## 2012-01-22 ENCOUNTER — Telehealth: Payer: Self-pay | Admitting: *Deleted

## 2012-01-22 ENCOUNTER — Ambulatory Visit (INDEPENDENT_AMBULATORY_CARE_PROVIDER_SITE_OTHER): Payer: Medicare Other | Admitting: Vascular Surgery

## 2012-01-22 ENCOUNTER — Encounter (INDEPENDENT_AMBULATORY_CARE_PROVIDER_SITE_OTHER): Payer: Medicare Other | Admitting: *Deleted

## 2012-01-22 ENCOUNTER — Encounter: Payer: Self-pay | Admitting: Vascular Surgery

## 2012-01-22 VITALS — BP 144/59 | HR 66 | Temp 97.8°F | Ht 72.0 in | Wt 186.0 lb

## 2012-01-22 DIAGNOSIS — I743 Embolism and thrombosis of arteries of the lower extremities: Secondary | ICD-10-CM | POA: Insufficient documentation

## 2012-01-22 DIAGNOSIS — I714 Abdominal aortic aneurysm, without rupture: Secondary | ICD-10-CM

## 2012-01-22 DIAGNOSIS — Z48812 Encounter for surgical aftercare following surgery on the circulatory system: Secondary | ICD-10-CM | POA: Insufficient documentation

## 2012-01-22 DIAGNOSIS — I739 Peripheral vascular disease, unspecified: Secondary | ICD-10-CM

## 2012-01-22 NOTE — Progress Notes (Signed)
Subjective:     Patient ID: [name], male   DOB: 11-14-1928, 78 y.o.   MRN: [id number]  HPI this 78 year old male returns for further followup regarding his abdominal aortic aneurysm resection performed 6 weeks ago. This was complicated by intraoperative embolization to his right lower extremity requiring embolectomy and anti-coagulation. Dr. Sharol Given saw the patient earlier today and has been following him regarding gangrene of the tips of 4 toes the right foot. Patient is regaining his strength and appetite. His activity level seems to be improving. He has no specific complaints today. He is on chronic Coumadin therapy followed by Dr. Rory Percy.  Past Medical History  Diagnosis Date  . AAA (abdominal aortic aneurysm)   . GERD (gastroesophageal reflux disease)   . Arthritis   . Hypertension   . Chronic kidney disease     Renal insufficiency  . Coronary artery disease   . Hemorrhoid     History  Substance Use Topics  . Smoking status: Former Smoker -- 1.0 packs/day    Types: Cigarettes    Quit date: 07/22/1973  . Smokeless tobacco: Never Used  . Alcohol Use: No    Family History  Problem Relation Age of Onset  . Coronary artery disease Mother   . Kidney disease Mother   . Heart attack Mother   . Stroke Father   . Hypertension Father     No Known Allergies  Current outpatient prescriptions:aspirin 81 MG tablet, Take 81 mg by mouth daily., Disp: , Rfl: ;  fish oil-omega-3 fatty acids 1000 MG capsule, Take 2 g by mouth daily., Disp: , Rfl: ;  levothyroxine (SYNTHROID, LEVOTHROID) 150 MCG tablet, Take 150 mcg by mouth daily., Disp: , Rfl: ;  metoprolol tartrate (LOPRESSOR) 25 MG tablet, Take 1 tablet (25 mg total) by mouth 2 (two) times daily., Disp: 60 tablet, Rfl: 2 Multiple Vitamins-Minerals (MULTIVITAMIN WITH MINERALS) tablet, Take 1 tablet by mouth daily., Disp: , Rfl: ;  ranitidine (ZANTAC) 150 MG tablet, Take 150 mg by mouth 2 (two) times daily., Disp: , Rfl: ;  warfarin  (COUMADIN) 2 MG tablet, As directed., Disp: , Rfl: ;  DISCONTD: warfarin (COUMADIN) 2 MG tablet, Alternate a whole pill with half pill every other day. Take in evenings and start with 1/2 a pill today., Disp: 45 tablet, Rfl: 1 iron polysaccharides (NIFEREX) 150 MG capsule, Take 1 capsule (150 mg total) by mouth 2 times daily at 12 noon and 4 pm. For anemia, Disp: 60 capsule, Rfl: 1  BP 144/59  Pulse 66  Temp 97.8 F (36.6 C) (Oral)  Ht 6' (1.829 m)  Wt 186 lb (84.369 kg)  BMI 25.23 kg/m2  Body mass index is 25.23 kg/(m^2).           Review of Systems     Objective:   Physical Exam blood pressure 144/59 heart rate 66 respirations 16 General well-developed well-nourished male no apparent stress alert and oriented x3 Abdomen soft nontender well-healed midline incision 3+ femoral popliteal posterior tibial pulses palpable bilaterally.  Right foot has gangrene at the tip of toes 1 through 4. Fifth toe is free of gangrene. Plantar surface and dorsal surface of the skin appears good in the foot. Some of the gangrene involves the nail beds. Left foot is free of any ischemic changes. There today I ordered lower extremity arterial Doppler studies. ABIs on the right are normal at 1.16 and 0.97 on the left. There are good biphasic waveforms in the right foot and  both posterior tibial and dorsalis pedis arteries.    Assessment:     Making good progress 6 weeks post open resection grafting abdominal aortic aneurysm with intraoperative embolization tibial vessels right lower extremity now with some gangrene on tips of toes-on chronic coagulation    Plan:     Dr.Duda will continue to follow patient and decide when to perform toe amputations if necessary. I will see patient back in 2 months for continued followup we'll likely leave him on Coumadin for 3-6 months.

## 2012-01-22 NOTE — Telephone Encounter (Signed)
Called back about extending PT visits 1w2. Verbal order given to extend PT as requested. They will fax written order to sign.

## 2012-02-15 ENCOUNTER — Other Ambulatory Visit (HOSPITAL_COMMUNITY): Payer: Self-pay | Admitting: Orthopedic Surgery

## 2012-02-15 ENCOUNTER — Encounter (HOSPITAL_COMMUNITY): Payer: Self-pay | Admitting: Pharmacy Technician

## 2012-02-19 ENCOUNTER — Other Ambulatory Visit (HOSPITAL_COMMUNITY): Payer: Self-pay | Admitting: Orthopedic Surgery

## 2012-02-19 ENCOUNTER — Encounter (HOSPITAL_COMMUNITY)
Admit: 2012-02-19 | Discharge: 2012-02-19 | Disposition: A | Discharge status: Home / Self Care | Payer: Medicare Other | Attending: Orthopedic Surgery | Admitting: Orthopedic Surgery

## 2012-02-19 ENCOUNTER — Encounter (HOSPITAL_COMMUNITY): Payer: Self-pay

## 2012-02-19 HISTORY — DX: Disorder of thyroid, unspecified: E07.9

## 2012-02-19 HISTORY — DX: Malignant (primary) neoplasm, unspecified: C80.1

## 2012-02-19 HISTORY — DX: Pneumonia, unspecified organism: J18.9

## 2012-02-19 LAB — CBC
Hemoglobin: 11.1 g/dL — ABNORMAL LOW (ref 13.0–17.0)
MCH: 30.2 pg (ref 26.0–34.0)
MCV: 93.2 fL (ref 78.0–100.0)
RBC: 3.68 MIL/uL — ABNORMAL LOW (ref 4.22–5.81)
WBC: 9.4 10*3/uL (ref 4.0–10.5)

## 2012-02-19 LAB — COMPREHENSIVE METABOLIC PANEL
ALT: 17 U/L (ref 0–53)
CO2: 25 mEq/L (ref 19–32)
Calcium: 9.5 mg/dL (ref 8.4–10.5)
Chloride: 111 mEq/L (ref 96–112)
Creatinine, Ser: 2.41 mg/dL — ABNORMAL HIGH (ref 0.50–1.35)
GFR calc Af Amer: 27 mL/min — ABNORMAL LOW (ref >90)
GFR calc non Af Amer: 24 mL/min — ABNORMAL LOW (ref >90)
Glucose, Bld: 101 mg/dL — ABNORMAL HIGH (ref 70–99)
Total Bilirubin: 0.2 mg/dL — ABNORMAL LOW (ref 0.3–1.2)

## 2012-02-19 LAB — SURGICAL PCR SCREEN: MRSA, PCR: NEGATIVE

## 2012-02-19 LAB — PROTIME-INR: INR: 1.23 (ref 0.00–1.49)

## 2012-02-19 MED ORDER — CEFAZOLIN SODIUM-DEXTROSE 2-3 GM-% IV SOLR
2.0000 g | INTRAVENOUS | Status: AC
  Administered 2012-02-20: 2 g via INTRAVENOUS
  Filled 2012-02-19: qty 50

## 2012-02-19 NOTE — Progress Notes (Addendum)
Forwarded chart to anesthesia to review cardiac records. Contacted Dr. Jess Barters office regarding patients coumadin for 02/19/12, spoke with Abigail Butts.  Dr. Sharol Given states ok to continue coumadin.  Notified patient.

## 2012-02-19 NOTE — Progress Notes (Signed)
See [name] consult note, encounter date 12/05/11 regarding recent cardiac testing.  Reviewed labs with [name].. Renal function stable since August 2013.

## 2012-02-19 NOTE — Pre-Procedure Instructions (Signed)
[place]  02/19/2012   Your procedure is scheduled on:  Wednesday February 20, 2012  Report to [place] at 9:00 AM.  Call this number if you have problems the morning of surgery: [phone]   Remember:   Do not eat food or drink :After Midnight.      Take these medicines the morning of surgery with A SIP OF WATER: levothyroxine, zantac   Do not wear jewelry, make-up or nail polish.  Do not wear lotions, powders, or perfumes.  Do not shave 48 hours prior to surgery. Men may shave face and neck.  Do not bring valuables to the hospital.  Contacts, dentures or bridgework may not be worn into surgery.  Leave suitcase in the car. After surgery it may be brought to your room.  For patients admitted to the hospital, checkout time is 11:00 AM the day of discharge.   Patients discharged the day of surgery will not be allowed to drive home.  Name and phone number of your driver: family / friend  Special Instructions: Shower using CHG 2 nights before surgery and the night before surgery.  If you shower the day of surgery use CHG.  Use special wash - you have one bottle of CHG for all showers.  You should use approximately 1/3 of the bottle for each shower.   Please read over the following fact sheets that you were given: Pain Booklet, Coughing and Deep Breathing, MRSA Information and Surgical Site Infection Prevention

## 2012-02-20 ENCOUNTER — Encounter (HOSPITAL_COMMUNITY): Payer: Self-pay | Admitting: *Deleted

## 2012-02-20 ENCOUNTER — Ambulatory Visit (HOSPITAL_COMMUNITY)
Admission: RE | Admit: 2012-02-20 | Discharge: 2012-02-20 | Disposition: A | Discharge status: Home / Self Care | Payer: Medicare Other | Source: Ambulatory Visit | Attending: Orthopedic Surgery | Admitting: Orthopedic Surgery

## 2012-02-20 ENCOUNTER — Encounter (HOSPITAL_COMMUNITY)
Admission: RE | Disposition: A | Discharge status: Home / Self Care | Payer: Self-pay | Source: Ambulatory Visit | Attending: Orthopedic Surgery

## 2012-02-20 ENCOUNTER — Ambulatory Visit (HOSPITAL_COMMUNITY): Payer: Medicare Other | Admitting: Anesthesiology

## 2012-02-20 ENCOUNTER — Encounter (HOSPITAL_COMMUNITY): Payer: Self-pay | Admitting: Anesthesiology

## 2012-02-20 ENCOUNTER — Encounter (HOSPITAL_COMMUNITY): Payer: Self-pay | Admitting: Vascular Surgery

## 2012-02-20 DIAGNOSIS — I96 Gangrene, not elsewhere classified: Secondary | ICD-10-CM | POA: Insufficient documentation

## 2012-02-20 DIAGNOSIS — I10 Essential (primary) hypertension: Secondary | ICD-10-CM | POA: Insufficient documentation

## 2012-02-20 DIAGNOSIS — Z01812 Encounter for preprocedural laboratory examination: Secondary | ICD-10-CM | POA: Insufficient documentation

## 2012-02-20 HISTORY — PX: AMPUTATION: SHX166

## 2012-02-20 SURGERY — AMPUTATION DIGIT
Anesthesia: General | Site: Foot | Laterality: Right | Wound class: Dirty or Infected

## 2012-02-20 MED ORDER — ONDANSETRON HCL 4 MG/2ML IJ SOLN
INTRAMUSCULAR | Status: DC | PRN
  Administered 2012-02-20: 4 mg via INTRAVENOUS

## 2012-02-20 MED ORDER — OXYCODONE HCL 5 MG/5ML PO SOLN: 5.0000 mg | Freq: Once | ORAL | Status: AC | PRN

## 2012-02-20 MED ORDER — CEFAZOLIN SODIUM-DEXTROSE 2-3 GM-% IV SOLR: 2.0000 g | INTRAVENOUS | Status: DC

## 2012-02-20 MED ORDER — HYDROMORPHONE HCL PF 1 MG/ML IJ SOLN
0.2500 mg | INTRAMUSCULAR | Status: DC | PRN
  Administered 2012-02-20: 0.5 mg via INTRAVENOUS

## 2012-02-20 MED ORDER — OXYCODONE HCL 5 MG PO TABS
ORAL_TABLET | ORAL | Status: AC
  Administered 2012-02-20: 5 mg via ORAL
  Filled 2012-02-20: qty 1

## 2012-02-20 MED ORDER — PROMETHAZINE HCL 25 MG/ML IJ SOLN: 6.2500 mg | INTRAMUSCULAR | Status: DC | PRN

## 2012-02-20 MED ORDER — MIDAZOLAM HCL 5 MG/5ML IJ SOLN
INTRAMUSCULAR | Status: DC | PRN
  Administered 2012-02-20: 1 mg via INTRAVENOUS

## 2012-02-20 MED ORDER — LIDOCAINE HCL (CARDIAC) 20 MG/ML IV SOLN
INTRAVENOUS | Status: DC | PRN
  Administered 2012-02-20: 80 mg via INTRAVENOUS

## 2012-02-20 MED ORDER — LACTATED RINGERS IV SOLN
INTRAVENOUS | Status: DC | PRN
  Administered 2012-02-20 (×2): via INTRAVENOUS

## 2012-02-20 MED ORDER — 0.9 % SODIUM CHLORIDE (POUR BTL) OPTIME
TOPICAL | Status: DC | PRN
  Administered 2012-02-20: 1000 mL

## 2012-02-20 MED ORDER — FENTANYL CITRATE 0.05 MG/ML IJ SOLN
INTRAMUSCULAR | Status: DC | PRN
  Administered 2012-02-20: 50 ug via INTRAVENOUS

## 2012-02-20 MED ORDER — OXYCODONE HCL 5 MG PO TABS
5.0000 mg | ORAL_TABLET | Freq: Once | ORAL | Status: AC | PRN
  Administered 2012-02-20: 5 mg via ORAL

## 2012-02-20 MED ORDER — HYDROMORPHONE HCL PF 1 MG/ML IJ SOLN
INTRAMUSCULAR | Status: AC
  Administered 2012-02-20: 0.5 mg via INTRAVENOUS
  Filled 2012-02-20: qty 1

## 2012-02-20 MED ORDER — PROPOFOL 10 MG/ML IV BOLUS
INTRAVENOUS | Status: DC | PRN
  Administered 2012-02-20: 200 mg via INTRAVENOUS

## 2012-02-20 SURGICAL SUPPLY — 50 items
BANDAGE GAUZE 4  KLING STR (GAUZE/BANDAGES/DRESSINGS) IMPLANT
BANDAGE GAUZE ELAST BULKY 4 IN (GAUZE/BANDAGES/DRESSINGS) ×2 IMPLANT
BLADE AVERAGE 25X9 (BLADE) IMPLANT
BLADE MINI RND TIP GREEN BEAV (BLADE) IMPLANT
BNDG COHESIVE 1X5 TAN STRL LF (GAUZE/BANDAGES/DRESSINGS) IMPLANT
BNDG COHESIVE 4X5 TAN STRL (GAUZE/BANDAGES/DRESSINGS) ×2 IMPLANT
BNDG COHESIVE 6X5 TAN STRL LF (GAUZE/BANDAGES/DRESSINGS) IMPLANT
BNDG ESMARK 4X9 LF (GAUZE/BANDAGES/DRESSINGS) IMPLANT
BNDG GAUZE STRTCH 6 (GAUZE/BANDAGES/DRESSINGS) IMPLANT
CLOTH BEACON ORANGE TIMEOUT ST (SAFETY) ×2 IMPLANT
CORDS BIPOLAR (ELECTRODE) IMPLANT
COVER SURGICAL LIGHT HANDLE (MISCELLANEOUS) ×2 IMPLANT
CUFF TOURNIQUET SINGLE 18IN (TOURNIQUET CUFF) IMPLANT
CUFF TOURNIQUET SINGLE 24IN (TOURNIQUET CUFF) IMPLANT
CUFF TOURNIQUET SINGLE 34IN LL (TOURNIQUET CUFF) IMPLANT
CUFF TOURNIQUET SINGLE 44IN (TOURNIQUET CUFF) IMPLANT
DRAPE U-SHAPE 47X51 STRL (DRAPES) ×2 IMPLANT
DRSG ADAPTIC 3X8 NADH LF (GAUZE/BANDAGES/DRESSINGS) ×2 IMPLANT
DRSG PAD ABDOMINAL 8X10 ST (GAUZE/BANDAGES/DRESSINGS) ×2 IMPLANT
DURAPREP 26ML APPLICATOR (WOUND CARE) ×2 IMPLANT
ELECT REM PT RETURN 9FT ADLT (ELECTROSURGICAL) ×2
ELECTRODE REM PT RTRN 9FT ADLT (ELECTROSURGICAL) ×1 IMPLANT
GAUZE SPONGE 2X2 8PLY STRL LF (GAUZE/BANDAGES/DRESSINGS) IMPLANT
GLOVE BIOGEL PI IND STRL 9 (GLOVE) ×1 IMPLANT
GLOVE BIOGEL PI INDICATOR 9 (GLOVE) ×1
GLOVE SURG ORTHO 9.0 STRL STRW (GLOVE) ×2 IMPLANT
GLOVE SURG SS PI 7.5 STRL IVOR (GLOVE) ×2 IMPLANT
GOWN PREVENTION PLUS XLARGE (GOWN DISPOSABLE) ×2 IMPLANT
GOWN SRG XL XLNG 56XLVL 4 (GOWN DISPOSABLE) ×1 IMPLANT
GOWN STRL NON-REIN XL XLG LVL4 (GOWN DISPOSABLE) ×1
KIT BASIN OR (CUSTOM PROCEDURE TRAY) ×2 IMPLANT
KIT ROOM TURNOVER OR (KITS) ×2 IMPLANT
MANIFOLD NEPTUNE II (INSTRUMENTS) IMPLANT
NEEDLE HYPO 25GX1X1/2 BEV (NEEDLE) IMPLANT
NS IRRIG 1000ML POUR BTL (IV SOLUTION) ×2 IMPLANT
PACK ORTHO EXTREMITY (CUSTOM PROCEDURE TRAY) ×2 IMPLANT
PAD ARMBOARD 7.5X6 YLW CONV (MISCELLANEOUS) ×2 IMPLANT
PAD CAST 4YDX4 CTTN HI CHSV (CAST SUPPLIES) IMPLANT
PADDING CAST COTTON 4X4 STRL (CAST SUPPLIES)
SPECIMEN JAR SMALL (MISCELLANEOUS) IMPLANT
SPONGE GAUZE 2X2 STER 10/PKG (GAUZE/BANDAGES/DRESSINGS)
SPONGE GAUZE 4X4 12PLY (GAUZE/BANDAGES/DRESSINGS) ×2 IMPLANT
SUCTION FRAZIER TIP 10 FR DISP (SUCTIONS) IMPLANT
SUT ETHILON 2 0 PSLX (SUTURE) ×4 IMPLANT
SUT VIC AB 2-0 FS1 27 (SUTURE) IMPLANT
SYR CONTROL 10ML LL (SYRINGE) IMPLANT
TOWEL OR 17X24 6PK STRL BLUE (TOWEL DISPOSABLE) IMPLANT
TOWEL OR 17X26 10 PK STRL BLUE (TOWEL DISPOSABLE) ×2 IMPLANT
TUBE CONNECTING 12X1/4 (SUCTIONS) IMPLANT
WATER STERILE IRR 1000ML POUR (IV SOLUTION) IMPLANT

## 2012-02-20 NOTE — H&P (Signed)
[name] is an 78 y.o. male.   Chief Complaint: Gangrene right foot toes one through 4 HPI: Patient is an 78 year old gentleman who is status post a thrombotic event causing ischemia to the right forefoot. Patient has shown progressive healing and resolution of the ischemic changes and presents at this time with dry gangrene of the tips of toes 1 through 4.  Past Medical History  Diagnosis Date  . AAA (abdominal aortic aneurysm)   . GERD (gastroesophageal reflux disease)   . Arthritis   . Coronary artery disease   . Hemorrhoid   . Thyroid disease   . Pneumonia     hx of pneumonia  . Cancer     skin lesions removed  . Hypertension     Dr. Rory Percy, [place]  . Chronic kidney disease     Renal insufficiency Dr. Ray Church    Past Surgical History  Procedure Date  . Eye surgery ~ 1 year    cataract  . Abdominal aortic aneurysm repair 12/05/2011    Procedure: ANEURYSM ABDOMINAL AORTIC REPAIR;  Surgeon: Mal Misty, MD;  Location: [facility] OR;  Service: Vascular;  Laterality: N/A;  Resection and Grafting of Abdominal Aortic Aneurysm using  18x45mm x 40cm Hemashielpd Gold Vascular Graft  . Artery exploration 12/05/2011    Procedure: ARTERY EXPLORATION;  Surgeon: Mal Misty, MD;  Location: [facility] OR;  Service: Vascular;  Laterality: Right;  Right Popliteal Artery Exploration  . Embolectomy 12/05/2011    Procedure: EMBOLECTOMY;  Surgeon: Mal Misty, MD;  Location: [place];  Service: Vascular;  Laterality: Right;  . Intraoperative arteriogram 12/05/2011    Procedure: INTRA OPERATIVE ARTERIOGRAM;  Surgeon: Mal Misty, MD;  Location: [place];  Service: Vascular;  Laterality: Right;  . Embolectomy 12/05/2011    Procedure: EMBOLECTOMY;  Surgeon: Mal Misty, MD;  Location: [place];  Service: Vascular;  Laterality: Right;  Thrombectomy of right anterior/posterior Tibial Arterys with vein patch angioplasty of tibial/peroneal trunk.  . Abdominal aortic aneurysm repair   . Mouth surgery      Family History  Problem Relation Age of Onset  . Coronary artery disease Mother   . Kidney disease Mother   . Heart attack Mother   . Stroke Father   . Hypertension Father    Social History:  reports that he quit smoking about 38 years ago. His smoking use included Cigarettes. He smoked 1 pack per day. He has never used smokeless tobacco. He reports that he does not drink alcohol or use illicit drugs.  Allergies: No Known Allergies  No prescriptions prior to admission    Results for orders placed during the hospital encounter of 02/19/12 (from the past 48 hour(s))  APTT     Status: Normal   Collection Time   02/19/12  1:35 PM      Component Value Range Comment   aPTT 35  24 - 37 seconds   CBC     Status: Abnormal   Collection Time   02/19/12  1:35 PM      Component Value Range Comment   WBC 9.4  4.0 - 10.5 K/uL    RBC 3.68 (*) 4.22 - 5.81 MIL/uL    Hemoglobin 11.1 (*) 13.0 - 17.0 g/dL    HCT 34.3 (*) 39.0 - 52.0 %    MCV 93.2  78.0 - 100.0 fL    MCH 30.2  26.0 - 34.0 pg    MCHC 32.4  30.0 - 36.0 g/dL  RDW 14.6  11.5 - 15.5 %    Platelets 186  150 - 400 K/uL   COMPREHENSIVE METABOLIC PANEL     Status: Abnormal   Collection Time   02/19/12  1:35 PM      Component Value Range Comment   Sodium 143  135 - 145 mEq/L    Potassium 5.1  3.5 - 5.1 mEq/L    Chloride 111  96 - 112 mEq/L    CO2 25  19 - 32 mEq/L    Glucose, Bld 101 (*) 70 - 99 mg/dL    BUN 31 (*) 6 - 23 mg/dL    Creatinine, Ser 2.41 (*) 0.50 - 1.35 mg/dL    Calcium 9.5  8.4 - 10.5 mg/dL    Total Protein 6.6  6.0 - 8.3 g/dL    Albumin 3.3 (*) 3.5 - 5.2 g/dL    AST 22  0 - 37 U/L    ALT 17  0 - 53 U/L    Alkaline Phosphatase 62  39 - 117 U/L    Total Bilirubin 0.2 (*) 0.3 - 1.2 mg/dL    GFR calc non Af Amer 24 (*) >90 mL/min    GFR calc Af Amer 27 (*) >90 mL/min   PROTIME-INR     Status: Abnormal   Collection Time   02/19/12  1:35 PM      Component Value Range Comment   Prothrombin Time 15.3 (*)  11.6 - 15.2 seconds    INR 1.23  0.00 - 1.49   SURGICAL PCR SCREEN     Status: Normal   Collection Time   02/19/12  1:35 PM      Component Value Range Comment   MRSA, PCR NEGATIVE  NEGATIVE    Staphylococcus aureus NEGATIVE  NEGATIVE    No results found.  Review of Systems  All other systems reviewed and are negative.    There were no vitals taken for this visit. Physical Exam  Patient has a palpable dorsalis pedis pulse. The gangrenous areas are demarcated at this time and showed no further healing. There is no cellulitis no drainage no signs of infection. Assessment/Plan Assessment: Dry gangrene in toes one through 4 right foot.  Plan. Patient will plan for partial amputation of digits one through 4 on the right foot. Risks and benefits were discussed including nonhealing need for additional surgery. Patient states he understands and wished to proceed at this time.  Leslie Jester V 02/20/2012, 6:14 AM

## 2012-02-20 NOTE — Anesthesia Postprocedure Evaluation (Signed)
Anesthesia Post Note  Patient: [name]  Procedure(s) Performed: Procedure(s) (LRB): AMPUTATION DIGIT (Right)  Anesthesia type: general  Patient location: PACU  Post pain: Pain level controlled  Post assessment: Patient's Cardiovascular Status Stable  Last Vitals:  Filed Vitals:   02/20/12 1214  BP: 164/75  Pulse: 58  Temp: 36 C  Resp: 18    Post vital signs: Reviewed and stable  Level of consciousness: sedated  Complications: No apparent anesthesia complications

## 2012-02-20 NOTE — Preoperative (Signed)
Beta Blockers   Reason not to administer Beta Blockers:Not Applicable 

## 2012-02-20 NOTE — Op Note (Signed)
OPERATIVE REPORT  DATE OF SURGERY: 02/20/2012  PATIENT:  [name],  78 y.o. male  PRE-OPERATIVE DIAGNOSIS:  gangrene right foot/toes  POST-OPERATIVE DIAGNOSIS:  gangrene right foot/toes  PROCEDURE:  Procedure(s): AMPUTATION DIGIT Amputation digits 1 through 4 right foot.  SURGEON:  Surgeon(s): Newt Minion, MD  ANESTHESIA:   general  EBL:  Minimal ML  SPECIMEN:  No Specimen  TOURNIQUET:  * No tourniquets in log *  PROCEDURE DETAILS: Patient is a 78 year old gentleman who is status post revascularization as well as status post thrombectomy for a thrombus distally to the right lower extremity. Patient initially had ischemic changes to the entire foot this has slowly resolved and now he only has gangrenous changes to the tips of toes one through 4 and the right foot. Patient does have exposed bone and presents at this time for amputation of the digits 1 through 4 right foot. Risks and benefits were discussed including persistent infection nonhealing of the wound need for additional surgery. Patient states he understands and wished to proceed at this time. Description of procedure patient was brought to the operating room and underwent a general anesthetic. After adequate levels of anesthesia were obtained patient's right lower extremity was prepped using DuraPrep and draped into a sterile field. A Miller to have a good soft tissue envelope the great toe was amputated through the MTP joint toes 23 and 4 were amputated through the PIP joint. The wounds were irrigated normal saline the skin was closed using 2-0 nylon there was no tension the skin. The wounds were covered with Adaptic orthopedic sponges ABDs dressing Kerlix and Coban. Patient was extubated taken to the PACU in stable condition plan for discharge to home followup in the office in one week  PLAN OF CARE: Discharge to home after PACU  PATIENT DISPOSITION:  PACU - hemodynamically stable.   Newt Minion,  MD 02/20/2012 11:05 AM

## 2012-02-20 NOTE — Transfer of Care (Signed)
Immediate Anesthesia Transfer of Care Note  Patient: [name]  Procedure(s) Performed: Procedure(s) (LRB) with comments: AMPUTATION DIGIT (Right) - Amputation Toes 1-4 Right Foot  Patient Location: PACU  Anesthesia Type:General  Level of Consciousness: awake, alert  and oriented  Airway & Oxygen Therapy: Patient Spontanous Breathing and Patient connected to nasal cannula oxygen  Post-op Assessment: Report given to PACU RN and Post -op Vital signs reviewed and stable  Post vital signs: Reviewed and stable  Complications: No apparent anesthesia complications

## 2012-02-20 NOTE — Anesthesia Preprocedure Evaluation (Signed)
Anesthesia Evaluation    Airway       Dental   Pulmonary neg pulmonary ROS, pneumonia -, resolved,          Cardiovascular hypertension, Pt. on medications and Pt. on home beta blockers + CAD     Neuro/Psych negative neurological ROS     GI/Hepatic Neg liver ROS, GERD-  Medicated and Controlled,  Endo/Other  negative endocrine ROS  Renal/GU Renal InsufficiencyRenal disease     Musculoskeletal   Abdominal   Peds  Hematology negative hematology ROS (+)   Anesthesia Other Findings   Reproductive/Obstetrics                           Anesthesia Physical Anesthesia Plan  ASA: III  Anesthesia Plan: General   Post-op Pain Management:    Induction: Intravenous  Airway Management Planned: LMA  Additional Equipment:   Intra-op Plan:   Post-operative Plan: Extubation in OR  Informed Consent: I have reviewed the patients History and Physical, chart, labs and discussed the procedure including the risks, benefits and alternatives for the proposed anesthesia with the patient or authorized representative who has indicated his/her understanding and acceptance.   Dental advisory given  Plan Discussed with: CRNA, Anesthesiologist and Surgeon  Anesthesia Plan Comments:         Anesthesia Quick Evaluation

## 2012-02-21 ENCOUNTER — Encounter (HOSPITAL_COMMUNITY): Payer: Self-pay | Admitting: Orthopedic Surgery

## 2012-03-24 ENCOUNTER — Encounter: Payer: Self-pay | Admitting: Vascular Surgery

## 2012-03-25 ENCOUNTER — Ambulatory Visit (INDEPENDENT_AMBULATORY_CARE_PROVIDER_SITE_OTHER): Payer: Medicare Other | Admitting: Vascular Surgery

## 2012-03-25 ENCOUNTER — Encounter: Payer: Self-pay | Admitting: Vascular Surgery

## 2012-03-25 VITALS — BP 170/89 | HR 89 | Resp 20 | Ht 72.0 in | Wt 190.0 lb

## 2012-03-25 DIAGNOSIS — I714 Abdominal aortic aneurysm, without rupture: Secondary | ICD-10-CM

## 2012-03-25 NOTE — Progress Notes (Signed)
Subjective:     Patient ID: [name], male   DOB: 10-03-1928, 78 y.o.   MRN: [id number]  HPI this 78 year old male returns now 3-1/2 months post resection and grafting of abdominal aortic aneurysm. This is complicated by intraoperative embolization into the right leg from the aneurysm sac. This required immediate embolectomy postoperatively with gradual improvement in the circulation of his right foot. Dr. Sharol Walker follow the patient and performed amputation distally toes 1 through 4 on October 30-right foot. This is all healed nicely. Patient has some stinging and burning discomfort on the dorsum of the foot but otherwise states the foot feels good. He is ambulating increasingly well. He has no abdominal symptoms such as nausea vomiting or abnormal bowel habits. He is on chronic Coumadin which is followed by Dr. Rory Walker  Past Medical History  Diagnosis Date  . AAA (abdominal aortic aneurysm)   . GERD (gastroesophageal reflux disease)   . Arthritis   . Coronary artery disease   . Hemorrhoid   . Thyroid disease   . Pneumonia     hx of pneumonia  . Cancer     skin lesions removed  . Hypertension     Dr. Rory Walker, [place]  . Chronic kidney disease     Renal insufficiency Dr. Ray Walker    History  Substance Use Topics  . Smoking status: Former Smoker -- 1.0 packs/day    Types: Cigarettes    Quit date: 07/22/1973  . Smokeless tobacco: Never Used  . Alcohol Use: No    Family History  Problem Relation Age of Onset  . Coronary artery disease Mother   . Kidney disease Mother   . Heart attack Mother   . Stroke Father   . Hypertension Father     No Known Allergies  Current outpatient prescriptions:aspirin 81 MG tablet, Take 81 mg by mouth daily., Disp: , Rfl: ;  fish oil-omega-3 fatty acids 1000 MG capsule, Take 2 g by mouth daily., Disp: , Rfl: ;  levothyroxine (SYNTHROID, LEVOTHROID) 150 MCG tablet, Take 150 mcg by mouth daily., Disp: , Rfl: ;  lisinopril-hydrochlorothiazide  (PRINZIDE,ZESTORETIC) 10-12.5 MG per tablet, Take 1 tablet by mouth daily., Disp: , Rfl:  ranitidine (ZANTAC) 150 MG tablet, Take 150 mg by mouth 2 (two) times daily., Disp: , Rfl: ;  warfarin (COUMADIN) 2 MG tablet, Take 2 mg by mouth daily. , Disp: , Rfl:   BP 170/89  Pulse 89  Resp 20  Ht 6' (1.829 m)  Wt 190 lb (86.183 kg)  BMI 25.77 kg/m2  Body mass index is 25.77 kg/(m^2).           Review of Systems denies chest pain, dyspnea on exertion, PND, orthopnea, claudication.     Objective:   Physical Exam blood pressure 170/89 heart rate 89 respirations 20 General well-developed well-nourished male no apparent stress alert and oriented x3 Lungs no rhonchi or wheezing Cardiovascular regular rhythm no murmurs carotid pulses 3+ no audible bruits Abdomen soft benign incision well healed with no evidence ventral hernia Bilateral 3+ femoral and 2-3+ posterior tibial pulses palpable. Distal toe amputations of toes one through 4 on the right-year-old nicely healed with no evidence of infection.     Assessment:     Doing well 3 months post resection grafting abdominal aortic aneurysm complicated by embolization right leg intraoperatively and eventual partial amputations of toes 1 through 4 right foot by [name]    Plan:     #1 I believe patient  should remain on Coumadin for 3 more months-he will be seeing Dr. Nadara Walker in early March 2014 and I believe it could be discontinued at that time and switch to daily aspirin #2 return to see me on when necessary basis

## 2013-06-24 ENCOUNTER — Other Ambulatory Visit: Payer: Self-pay | Admitting: Dermatology

## 2013-07-30 ENCOUNTER — Other Ambulatory Visit: Payer: Self-pay | Admitting: Dermatology

## 2013-10-25 IMAGING — CR DG CHEST 2V
2 series · 2 of 2 positions shown · non-contrast
Comparison: None.

CLINICAL DATA: Abdominal aortic aneurysm.  Preoperative exam.

CHEST - 2 VIEW

[view not recorded (1 of 2)]
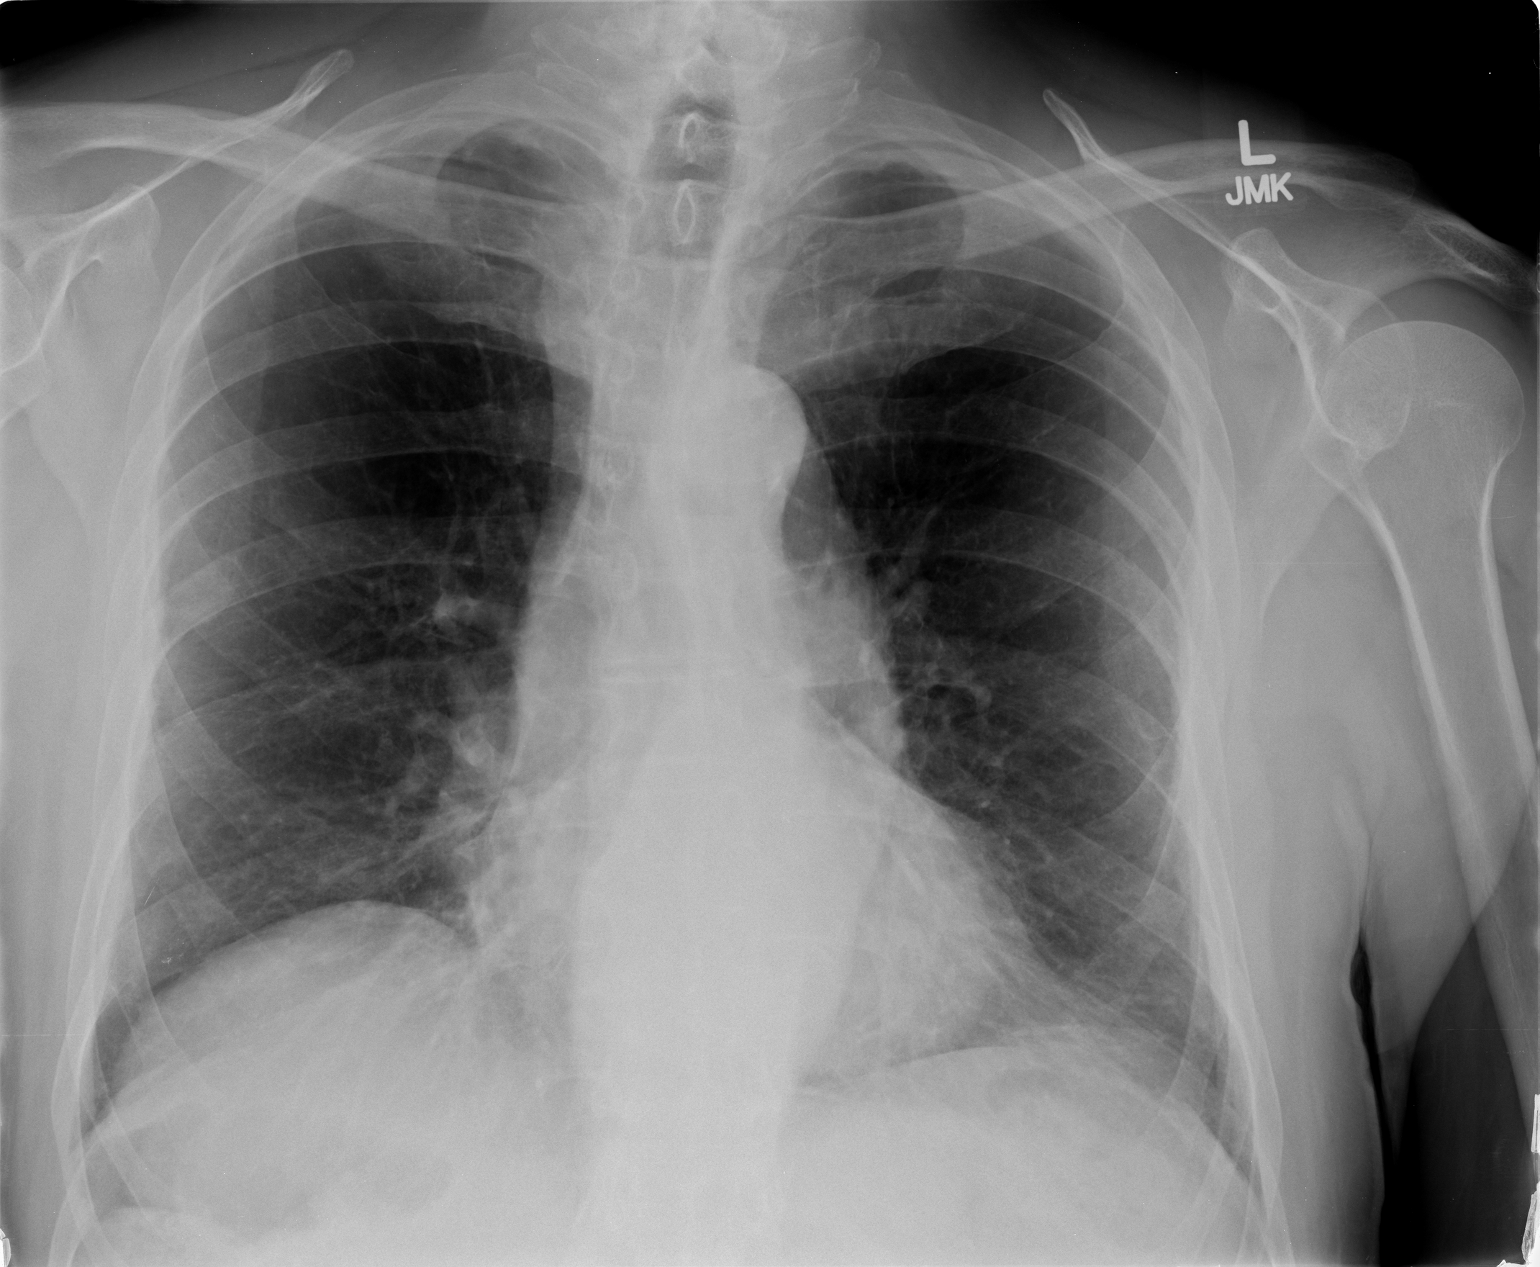

[view not recorded (2 of 2)]
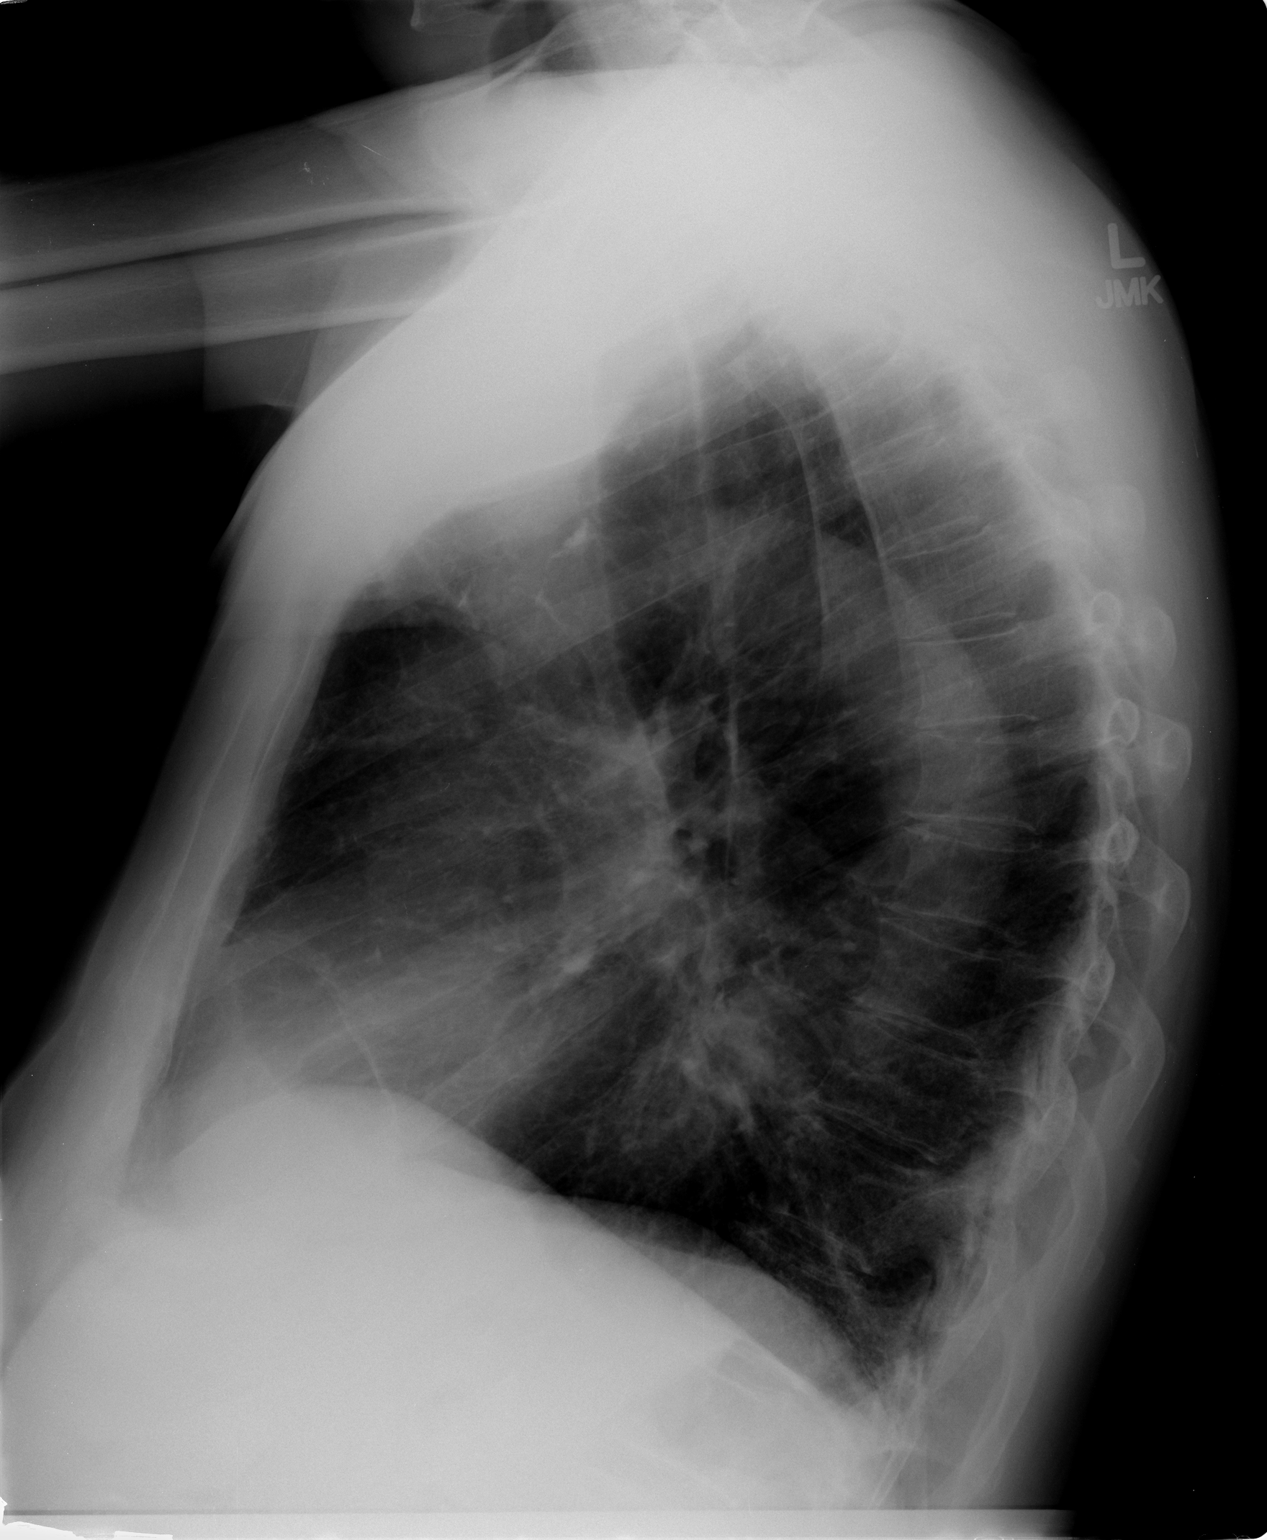

[2 of 2 positions shown; findings below may reference images not displayed]

FINDINGS: Heart size and pulmonary vascularity are normal.  There
is tortuosity of the thoracic aorta.  The esophagus is dilated as
demonstrated on prior CT scan.  The patient has emphysematous
disease at throughout both lungs.  No acute osseous abnormality.
IMPRESSION: 1.  Emphysema.
2.  Dilated esophagus.

## 2013-11-01 IMAGING — CR DG ANG/EXT/UNI/OR RIGHT
1 series · 1 of 1 positions shown · non-contrast
Comparison: None.

CLINICAL DATA: Right leg embolectomy.

RIGHT BRYANN/EXT/UNI/ OR

[AP]
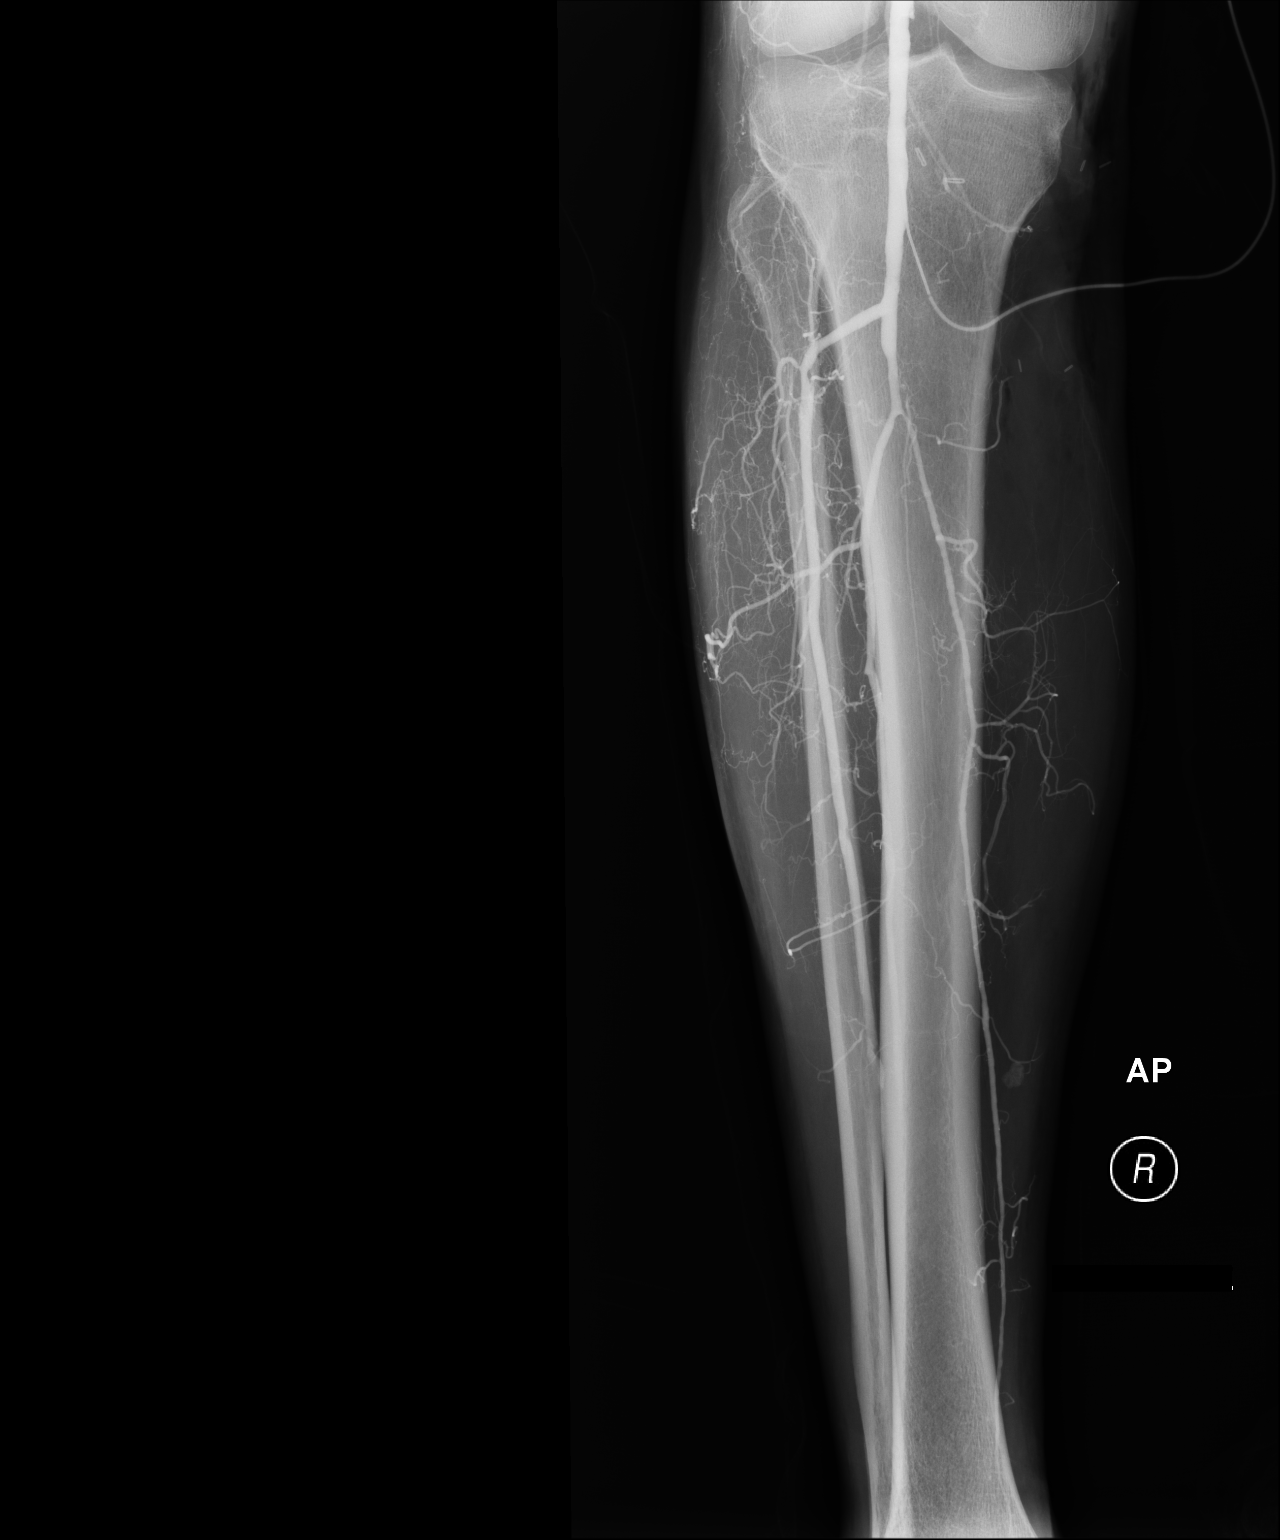

[1 of 1 positions shown; findings below may reference images not displayed]

FINDINGS: Intraoperative image demonstrates opacification of the
native popliteal artery which appears normally patent from the
level of the knee joint into the proximal calf.  The posterior
tibial artery is patent to the ankle.  The anterior tibial artery
has a filling defect at roughly the juncture of the mid and distal
calf.
IMPRESSION: Distal anterior tibial artery filling defect.

## 2013-11-02 IMAGING — CR DG CHEST 1V PORT
1 series · 1 of 1 positions shown · non-contrast
Comparison: Portable chest x-ray of 12/04/2028

CLINICAL DATA: Evaluate endotracheal tube position, cold left leg

PORTABLE CHEST - 1 VIEW

[AP]
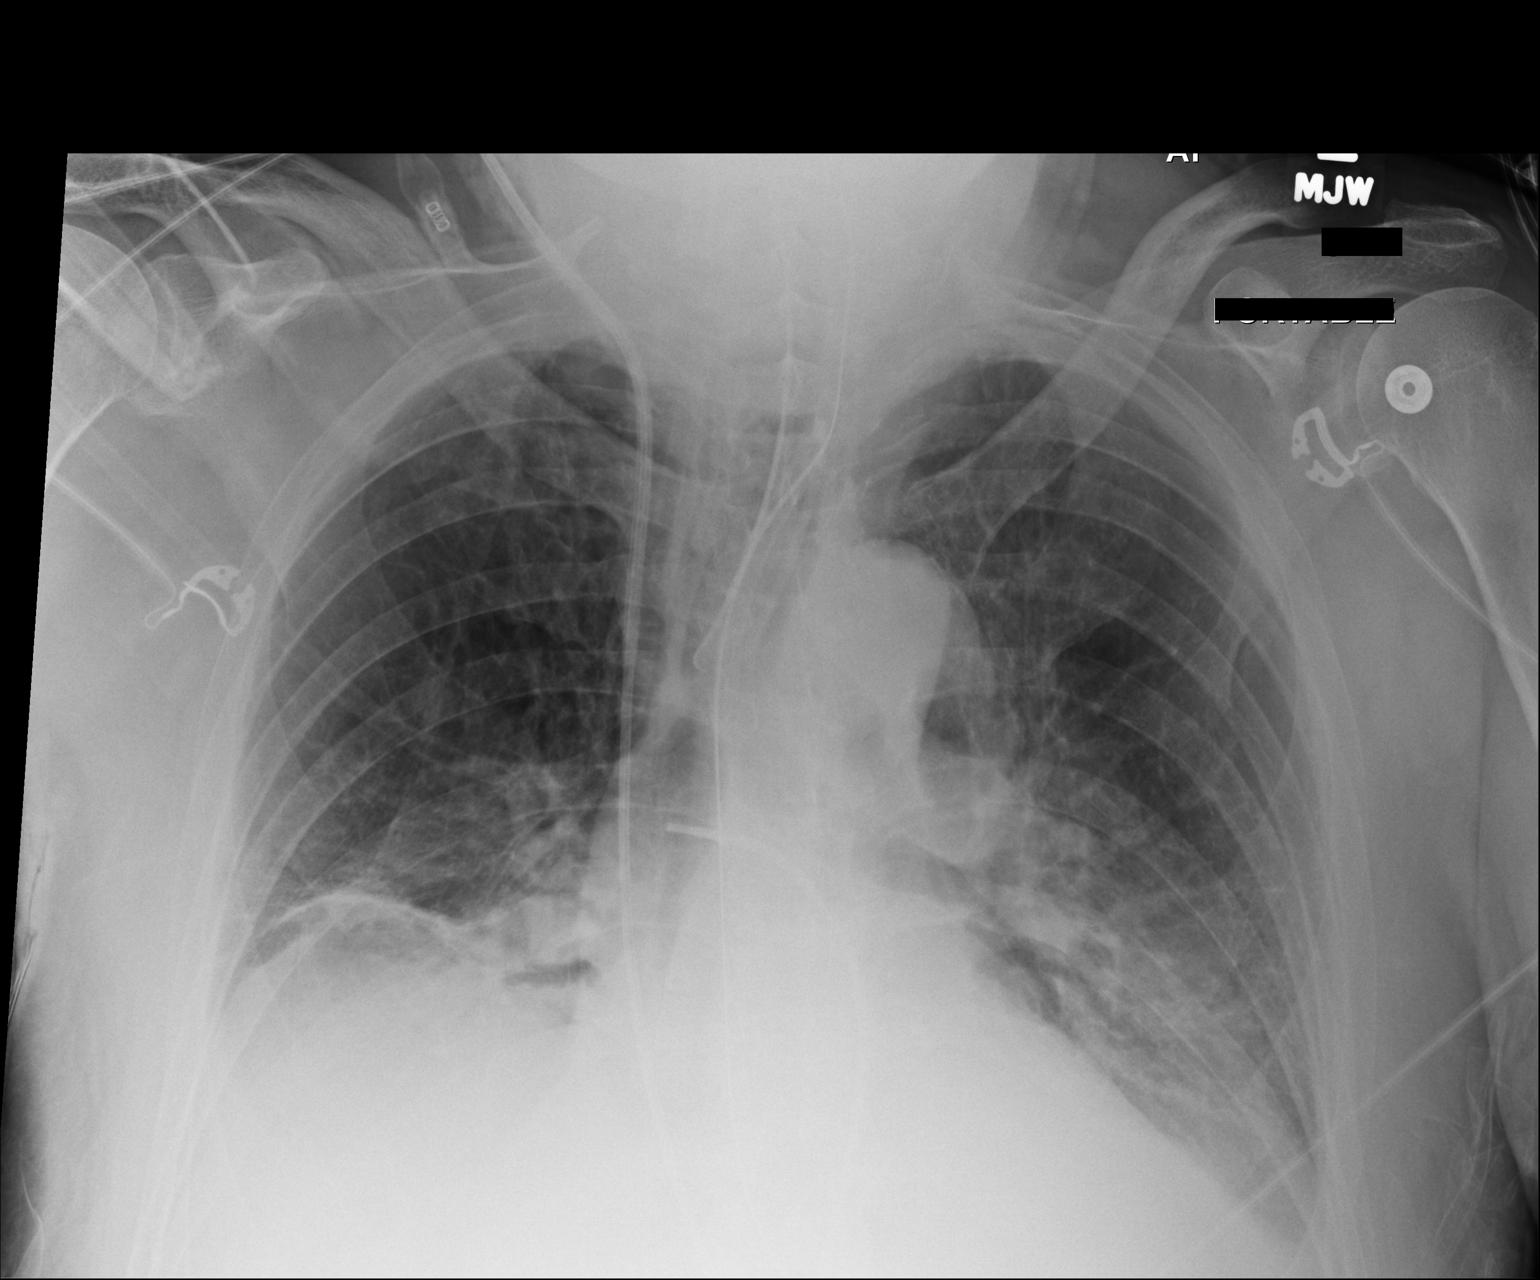

[1 of 1 positions shown; findings below may reference images not displayed]

FINDINGS: Pain aeration of the lungs has improved.  Bibasilar
opacities remain most consistent with atelectasis or possibly
effusions.  Pneumonia in the lung bases cannot be excluded.  The
tip of the endotracheal tube is approximally 2.8 cm above the
carina.  Swan-Ganz catheter is noted with the tip in the right
pulmonary artery.
IMPRESSION: 1.  Slightly better aeration with persistent bibasilar opacities.
2.  Tip of endotracheal tube 2.8 cm above the carina.

## 2013-11-04 IMAGING — CR DG CHEST 1V PORT
1 series · 1 of 1 positions shown · non-contrast
Comparison: NG tube and right IJ approach Swan-Ganz catheter have
been removed.  Right IJ sheath remains in place.

CLINICAL DATA: Chest pain.

PORTABLE CHEST - 1 VIEW

[AP]
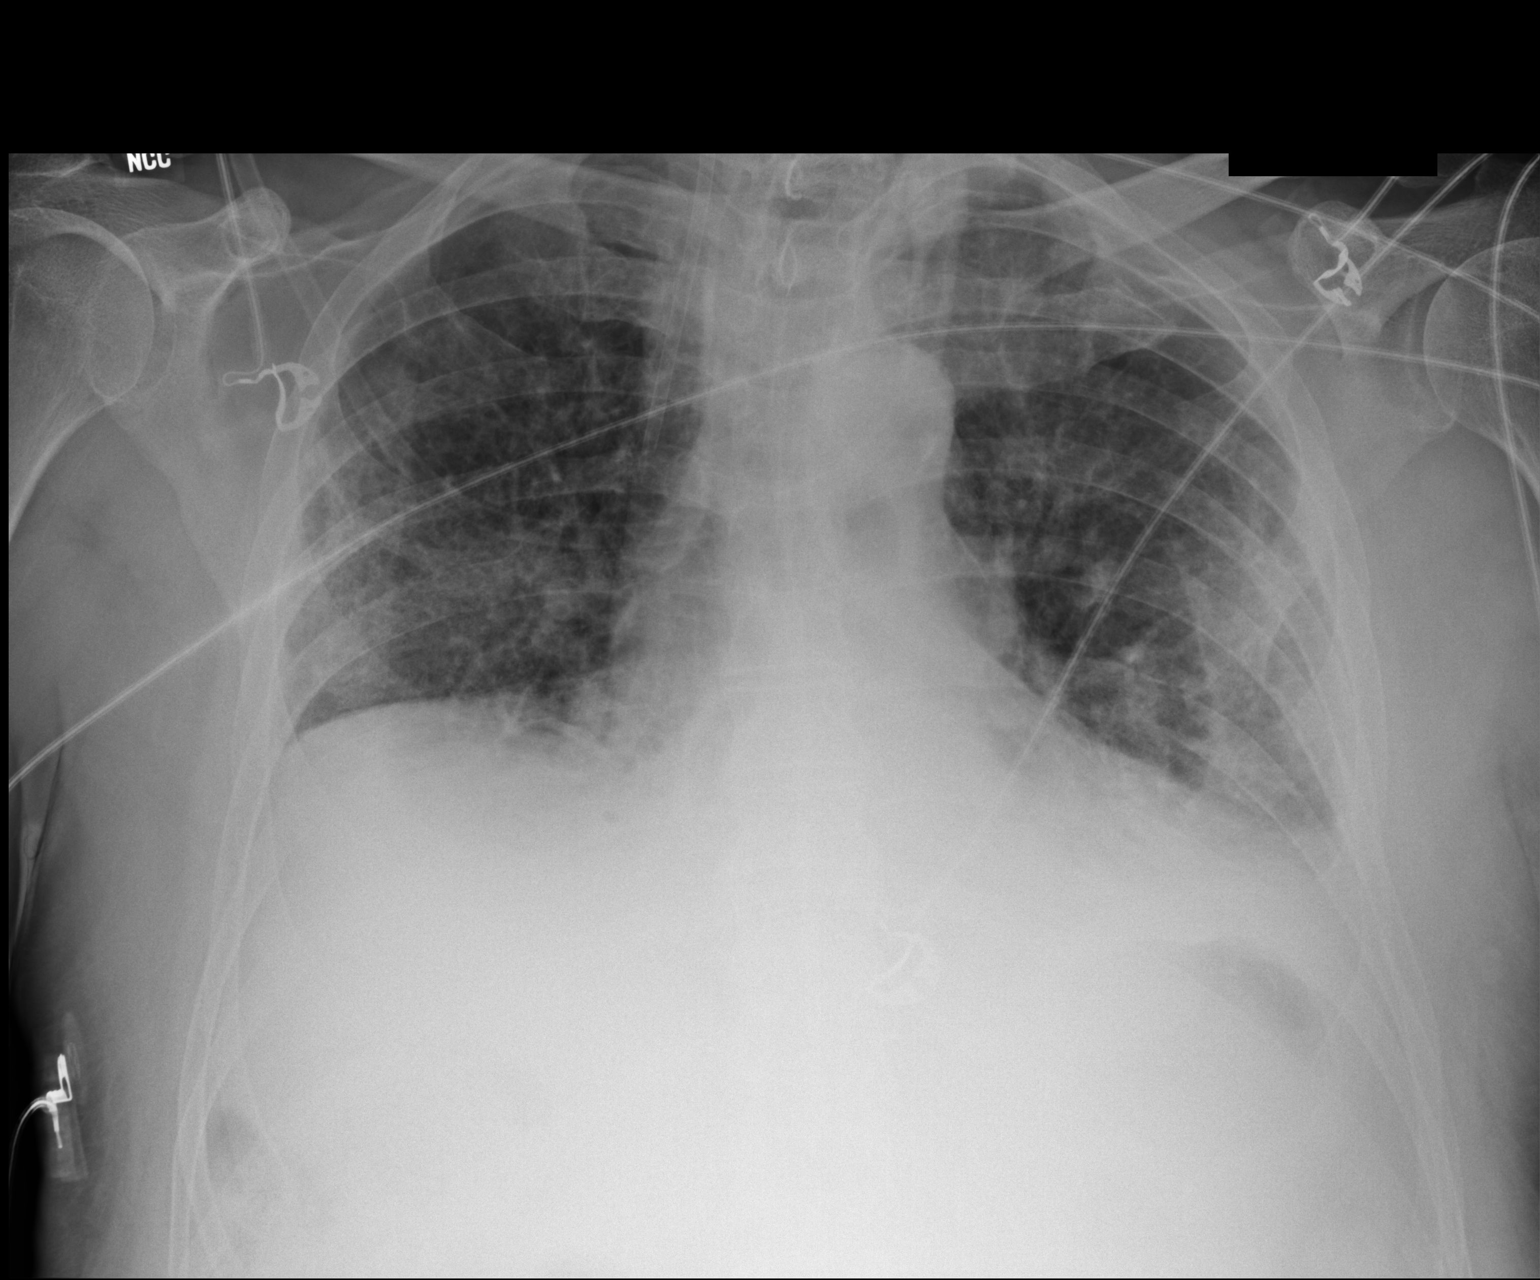

[1 of 1 positions shown; findings below may reference images not displayed]

FINDINGS: The patient has small bilateral pleural effusions and
basilar atelectasis, more prominent than left.  The patient's left
effusion appears decreased.  No pneumothorax identified.  Lung
volumes are low.  Heart size appears enlarged.
IMPRESSION: 1.  Status post NG tube and Swan-Ganz catheter removal with a right
IJ sheath in place.
2.  Small bilateral pleural effusions and basilar atelectasis,
greater on the left.  Left basilar aeration appears slightly
improved.

## 2014-04-29 DIAGNOSIS — J0110 Acute frontal sinusitis, unspecified: Secondary | ICD-10-CM | POA: Diagnosis not present

## 2014-05-31 DIAGNOSIS — N183 Chronic kidney disease, stage 3 (moderate): Secondary | ICD-10-CM | POA: Diagnosis not present

## 2014-05-31 DIAGNOSIS — I129 Hypertensive chronic kidney disease with stage 1 through stage 4 chronic kidney disease, or unspecified chronic kidney disease: Secondary | ICD-10-CM | POA: Diagnosis not present

## 2014-05-31 DIAGNOSIS — E559 Vitamin D deficiency, unspecified: Secondary | ICD-10-CM | POA: Diagnosis not present

## 2014-05-31 DIAGNOSIS — R809 Proteinuria, unspecified: Secondary | ICD-10-CM | POA: Diagnosis not present

## 2014-05-31 DIAGNOSIS — Z79899 Other long term (current) drug therapy: Secondary | ICD-10-CM | POA: Diagnosis not present

## 2014-05-31 DIAGNOSIS — D649 Anemia, unspecified: Secondary | ICD-10-CM | POA: Diagnosis not present

## 2014-06-08 DIAGNOSIS — E875 Hyperkalemia: Secondary | ICD-10-CM | POA: Diagnosis not present

## 2014-06-08 DIAGNOSIS — N183 Chronic kidney disease, stage 3 (moderate): Secondary | ICD-10-CM | POA: Diagnosis not present

## 2014-06-08 DIAGNOSIS — E559 Vitamin D deficiency, unspecified: Secondary | ICD-10-CM | POA: Diagnosis not present

## 2014-06-08 DIAGNOSIS — I959 Hypotension, unspecified: Secondary | ICD-10-CM | POA: Diagnosis not present

## 2014-06-22 DIAGNOSIS — I129 Hypertensive chronic kidney disease with stage 1 through stage 4 chronic kidney disease, or unspecified chronic kidney disease: Secondary | ICD-10-CM | POA: Diagnosis not present

## 2014-06-22 DIAGNOSIS — Z79899 Other long term (current) drug therapy: Secondary | ICD-10-CM | POA: Diagnosis not present

## 2014-06-22 DIAGNOSIS — N183 Chronic kidney disease, stage 3 (moderate): Secondary | ICD-10-CM | POA: Diagnosis not present

## 2014-07-08 DIAGNOSIS — N183 Chronic kidney disease, stage 3 (moderate): Secondary | ICD-10-CM | POA: Diagnosis not present

## 2014-07-08 DIAGNOSIS — Z79899 Other long term (current) drug therapy: Secondary | ICD-10-CM | POA: Diagnosis not present

## 2014-07-08 DIAGNOSIS — I129 Hypertensive chronic kidney disease with stage 1 through stage 4 chronic kidney disease, or unspecified chronic kidney disease: Secondary | ICD-10-CM | POA: Diagnosis not present

## 2014-08-13 DIAGNOSIS — N189 Chronic kidney disease, unspecified: Secondary | ICD-10-CM | POA: Diagnosis not present

## 2014-08-13 DIAGNOSIS — I10 Essential (primary) hypertension: Secondary | ICD-10-CM | POA: Diagnosis not present

## 2014-08-13 DIAGNOSIS — E038 Other specified hypothyroidism: Secondary | ICD-10-CM | POA: Diagnosis not present

## 2014-08-19 DIAGNOSIS — N189 Chronic kidney disease, unspecified: Secondary | ICD-10-CM | POA: Diagnosis not present

## 2014-08-19 DIAGNOSIS — E038 Other specified hypothyroidism: Secondary | ICD-10-CM | POA: Diagnosis not present

## 2014-08-19 DIAGNOSIS — Z1389 Encounter for screening for other disorder: Secondary | ICD-10-CM | POA: Diagnosis not present

## 2014-08-19 DIAGNOSIS — I10 Essential (primary) hypertension: Secondary | ICD-10-CM | POA: Diagnosis not present

## 2014-08-24 DIAGNOSIS — R809 Proteinuria, unspecified: Secondary | ICD-10-CM | POA: Diagnosis not present

## 2014-08-24 DIAGNOSIS — I509 Heart failure, unspecified: Secondary | ICD-10-CM | POA: Diagnosis not present

## 2014-08-24 DIAGNOSIS — I10 Essential (primary) hypertension: Secondary | ICD-10-CM | POA: Diagnosis not present

## 2014-08-24 DIAGNOSIS — N184 Chronic kidney disease, stage 4 (severe): Secondary | ICD-10-CM | POA: Diagnosis not present

## 2014-08-24 DIAGNOSIS — E875 Hyperkalemia: Secondary | ICD-10-CM | POA: Diagnosis not present

## 2014-11-30 DIAGNOSIS — N183 Chronic kidney disease, stage 3 (moderate): Secondary | ICD-10-CM | POA: Diagnosis not present

## 2014-11-30 DIAGNOSIS — D509 Iron deficiency anemia, unspecified: Secondary | ICD-10-CM | POA: Diagnosis not present

## 2014-11-30 DIAGNOSIS — Z79899 Other long term (current) drug therapy: Secondary | ICD-10-CM | POA: Diagnosis not present

## 2014-11-30 DIAGNOSIS — E559 Vitamin D deficiency, unspecified: Secondary | ICD-10-CM | POA: Diagnosis not present

## 2014-11-30 DIAGNOSIS — R809 Proteinuria, unspecified: Secondary | ICD-10-CM | POA: Diagnosis not present

## 2014-11-30 DIAGNOSIS — I129 Hypertensive chronic kidney disease with stage 1 through stage 4 chronic kidney disease, or unspecified chronic kidney disease: Secondary | ICD-10-CM | POA: Diagnosis not present

## 2014-12-07 DIAGNOSIS — N184 Chronic kidney disease, stage 4 (severe): Secondary | ICD-10-CM | POA: Diagnosis not present

## 2014-12-07 DIAGNOSIS — I10 Essential (primary) hypertension: Secondary | ICD-10-CM | POA: Diagnosis not present

## 2014-12-07 DIAGNOSIS — N2581 Secondary hyperparathyroidism of renal origin: Secondary | ICD-10-CM | POA: Diagnosis not present

## 2014-12-07 DIAGNOSIS — R809 Proteinuria, unspecified: Secondary | ICD-10-CM | POA: Diagnosis not present

## 2014-12-23 DIAGNOSIS — J019 Acute sinusitis, unspecified: Secondary | ICD-10-CM | POA: Diagnosis not present

## 2015-01-10 DIAGNOSIS — Z23 Encounter for immunization: Secondary | ICD-10-CM | POA: Diagnosis not present

## 2015-03-02 DIAGNOSIS — N184 Chronic kidney disease, stage 4 (severe): Secondary | ICD-10-CM | POA: Diagnosis not present

## 2015-03-02 DIAGNOSIS — E875 Hyperkalemia: Secondary | ICD-10-CM | POA: Diagnosis not present

## 2015-03-02 DIAGNOSIS — I129 Hypertensive chronic kidney disease with stage 1 through stage 4 chronic kidney disease, or unspecified chronic kidney disease: Secondary | ICD-10-CM | POA: Diagnosis not present

## 2015-03-02 DIAGNOSIS — D649 Anemia, unspecified: Secondary | ICD-10-CM | POA: Diagnosis not present

## 2015-03-02 DIAGNOSIS — R809 Proteinuria, unspecified: Secondary | ICD-10-CM | POA: Diagnosis not present

## 2015-03-08 DIAGNOSIS — I10 Essential (primary) hypertension: Secondary | ICD-10-CM | POA: Diagnosis not present

## 2015-03-08 DIAGNOSIS — D649 Anemia, unspecified: Secondary | ICD-10-CM | POA: Diagnosis not present

## 2015-03-08 DIAGNOSIS — R809 Proteinuria, unspecified: Secondary | ICD-10-CM | POA: Diagnosis not present

## 2015-03-08 DIAGNOSIS — N184 Chronic kidney disease, stage 4 (severe): Secondary | ICD-10-CM | POA: Diagnosis not present

## 2015-03-30 DIAGNOSIS — N189 Chronic kidney disease, unspecified: Secondary | ICD-10-CM | POA: Diagnosis not present

## 2015-03-30 DIAGNOSIS — E038 Other specified hypothyroidism: Secondary | ICD-10-CM | POA: Diagnosis not present

## 2015-03-30 DIAGNOSIS — I10 Essential (primary) hypertension: Secondary | ICD-10-CM | POA: Diagnosis not present

## 2015-03-30 DIAGNOSIS — M25551 Pain in right hip: Secondary | ICD-10-CM | POA: Diagnosis not present

## 2015-04-06 DIAGNOSIS — Z23 Encounter for immunization: Secondary | ICD-10-CM | POA: Diagnosis not present

## 2015-04-06 DIAGNOSIS — N189 Chronic kidney disease, unspecified: Secondary | ICD-10-CM | POA: Diagnosis not present

## 2015-04-06 DIAGNOSIS — Z0001 Encounter for general adult medical examination with abnormal findings: Secondary | ICD-10-CM | POA: Diagnosis not present

## 2015-04-08 DIAGNOSIS — I739 Peripheral vascular disease, unspecified: Secondary | ICD-10-CM | POA: Diagnosis not present

## 2015-04-08 DIAGNOSIS — Z87891 Personal history of nicotine dependence: Secondary | ICD-10-CM | POA: Diagnosis not present

## 2015-04-08 DIAGNOSIS — I10 Essential (primary) hypertension: Secondary | ICD-10-CM | POA: Diagnosis not present

## 2015-04-08 DIAGNOSIS — Z9889 Other specified postprocedural states: Secondary | ICD-10-CM | POA: Diagnosis not present

## 2015-04-13 ENCOUNTER — Other Ambulatory Visit: Payer: Self-pay | Admitting: *Deleted

## 2015-04-13 DIAGNOSIS — I739 Peripheral vascular disease, unspecified: Secondary | ICD-10-CM

## 2015-04-19 ENCOUNTER — Encounter: Payer: Self-pay | Admitting: Vascular Surgery

## 2015-04-26 ENCOUNTER — Encounter: Payer: Self-pay | Admitting: Vascular Surgery

## 2015-04-26 ENCOUNTER — Ambulatory Visit (INDEPENDENT_AMBULATORY_CARE_PROVIDER_SITE_OTHER): Payer: Commercial Managed Care - HMO | Admitting: Vascular Surgery

## 2015-04-26 ENCOUNTER — Ambulatory Visit (HOSPITAL_COMMUNITY)
Admission: RE | Admit: 2015-04-26 | Discharge: 2015-04-26 | Disposition: A | Discharge status: Home / Self Care | Payer: Medicare HMO | Source: Ambulatory Visit | Attending: Vascular Surgery | Admitting: Vascular Surgery

## 2015-04-26 DIAGNOSIS — I739 Peripheral vascular disease, unspecified: Secondary | ICD-10-CM

## 2015-04-26 NOTE — Progress Notes (Signed)
Filed Vitals:   04/26/15 1257 04/26/15 1258  BP: 157/82 135/77  Pulse: 59 57  Temp: 97.5 F (36.4 C)   Resp: 18   Height: 6' (1.829 m)   Weight: 207 lb (93.895 kg)   SpO2: 97%

## 2015-04-26 NOTE — Progress Notes (Signed)
[facility] and [facility]in Specialist of [facility]  Patient name: [name] MRN: [id number] DOB: 1928/06/04 Sex: male  REASON FOR CONSULT: Claudication  HPI: [name] is a 82 y.o. male, who presents for evaluation of right leg claudication. He is known to Dr. Kellie Simmering from having undergone prior open AAA repair back on 12/05/2011. His surgery was complicated by embolization to his right leg which ultimately resulted in amputations of digits 1 through 4 of the right foot.   He reports onset of right lateral calf pain about 2 months ago. He had ABIs performed on 04/08/2015 from [facility] revealing ABI of 0.8 right and ABIs of 0.88 on the left. This pain comes on with standing and also with walking. He will to ambulate more than a block. He describes his pain is aching which worsens as the day progresses. However, he denies this pain interfering with his daily activity. He denies any rest pain. He denies any nonhealing wounds. He denies issues with his left leg. He does complain of right hip pain that started about 6 months ago. His recent hip x-ray revealed early osteoarthritis. He does complain of swelling in his legs.   The patient has a history of CAD, chronic kidney disease, and hypertension. He is a former smoker. He is on chronic Coumadin therapy.  Past Medical History  Diagnosis Date  . AAA (abdominal aortic aneurysm) ([place])   . GERD (gastroesophageal reflux disease)   . Arthritis   . Coronary artery disease   . Hemorrhoid   . Thyroid disease   . Pneumonia     hx of pneumonia  . Cancer ([place])     skin lesions removed  . Hypertension     Dr. Rory Percy, [place]  . Chronic kidney disease     Renal insufficiency Dr. Ray Church    Family History  Problem Relation Age of Onset  . Coronary artery disease Mother   . Kidney disease Mother   . Heart attack Mother   . Stroke Father   . Hypertension Father     SOCIAL HISTORY: Social History   Social History  . Marital Status:  Married    Spouse Name: N/A  . Number of Children: N/A  . Years of Education: N/A   Occupational History  . Not on file.   Social History Main Topics  . Smoking status: Former Smoker -- 1.00 packs/day    Types: Cigarettes    Quit date: 07/22/1973  . Smokeless tobacco: Never Used  . Alcohol Use: No  . Drug Use: No  . Sexual Activity: Not on file   Other Topics Concern  . Not on file   Social History Narrative    No Known Allergies  Current Outpatient Prescriptions  Medication Sig Dispense Refill  . amLODipine (NORVASC) 10 MG tablet Take 10 mg by mouth daily.    Marland Kitchen aspirin 81 MG tablet Take 81 mg by mouth daily.    . Cholecalciferol (VITAMIN D3) 1000 units CAPS Take 1,000 Units by mouth daily.    . fish oil-omega-3 fatty acids 1000 MG capsule Take 2 g by mouth daily.    . furosemide (LASIX) 20 MG tablet Take 20 mg by mouth as needed.    Marland Kitchen levothyroxine (SYNTHROID, LEVOTHROID) 150 MCG tablet Take 150 mcg by mouth daily.    Marland Kitchen lisinopril-hydrochlorothiazide (PRINZIDE,ZESTORETIC) 10-12.5 MG per tablet Take 1 tablet by mouth daily.    . Multiple Vitamin (MULTIVITAMIN) tablet Take 1 tablet by mouth daily.    Marland Kitchen  ranitidine (ZANTAC) 150 MG tablet Take 150 mg by mouth 2 (two) times daily.    Marland Kitchen warfarin (COUMADIN) 2 MG tablet Take 2 mg by mouth daily.      No current facility-administered medications for this visit.    REVIEW OF SYSTEMS:  [X]  denotes positive finding, [ ]  denotes negative finding Cardiac  Comments:  Chest pain or chest pressure:    Shortness of breath upon exertion:    Short of breath when lying flat:    Irregular heart rhythm:        Vascular    Pain in calf, thigh, or hip brought on by ambulation:    Pain in feet at night that wakes you up from your sleep:     Blood clot in your veins:    Leg swelling:  x       Pulmonary    Oxygen at home:    Productive cough:     Wheezing:         Neurologic    Sudden weakness in arms or legs:     Sudden numbness in  arms or legs:     Sudden onset of difficulty speaking or slurred speech:    Temporary loss of vision in one eye:     Problems with dizziness:         Gastrointestinal    Blood in stool:     Vomited blood:         Genitourinary    Burning when urinating:     Blood in urine:        Psychiatric    Major depression:         Hematologic    Bleeding problems:    Problems with blood clotting too easily:        Skin    Rashes or ulcers:        Constitutional    Fever or chills:      PHYSICAL EXAM: Filed Vitals:   04/26/15 1257 04/26/15 1258  BP: 157/82 135/77  Pulse: 59 57  Temp: 97.5 F (36.4 C)   Resp: 18   Height: 6' (1.829 m)   Weight: 207 lb (93.895 kg)   SpO2: 97%     GENERAL: The patient is a well-nourished male, in no acute distress. The vital signs are documented above. CARDIAC: There is a regular rate and rhythm.  VASCULAR: 2+ femoral pulses bilaterally, 2+ popliteal pulses bilaterally, nonpalpable right pedal pulses. Easily palpable left dorsalis pedis pulse. Feet appear well perfused bilaterally. PULMONARY: There is good air exchange bilaterally without wheezing or rales. ABDOMEN: Soft and non-tender with normal pitched bowel sounds.  MUSCULOSKELETAL: There are no major deformities or cyanosis. Well-healed amputation digits 1 through 4 right foot. NEUROLOGIC: No focal weakness or paresthesias are detected. SKIN: There are no ulcers or rashes noted. PSYCHIATRIC: The patient has a normal affect.  DATA:  ABIs performed on 04/08/2015 from [facility] revealing ABI of 0.8 right and ABIs of 0.88 on the left.  Lower extremity arterial duplex performed today 05/23/2015 revealing triphasic and biphasic waveforms of the right lower extremity. Biphasic waveforms on the left.  MEDICAL ISSUES:  Peripheral vascular disease with intermittent claudication (right)  Status post open AAA repair complicated by embolization to right lower extremity 2013.  The patient's  symptoms are tolerable. He is able to perform his activities of daily living. His feet appear well perfused. His ABIs are reassuring with triphasic and biphasic waveforms on the right. Do not recommend  any further workup at this time. Advised the patient to return if his symptoms worsen or become intolerable. He will follow up on an as needed basis.   Virgina Jock, PA-C [facility]   agree with above assessments  both feet adequately perfused and symptoms are not severe and right lateral calf with ambulation Toe amputation sites all look good  On right foot    if patient's symptoms worsen he will be in touch with [place] for reevaluation otherwise no indication for intervention at this time

## 2015-05-27 DIAGNOSIS — L989 Disorder of the skin and subcutaneous tissue, unspecified: Secondary | ICD-10-CM | POA: Diagnosis not present

## 2015-05-27 DIAGNOSIS — C44319 Basal cell carcinoma of skin of other parts of face: Secondary | ICD-10-CM | POA: Diagnosis not present

## 2015-06-08 DIAGNOSIS — D509 Iron deficiency anemia, unspecified: Secondary | ICD-10-CM | POA: Diagnosis not present

## 2015-06-08 DIAGNOSIS — I129 Hypertensive chronic kidney disease with stage 1 through stage 4 chronic kidney disease, or unspecified chronic kidney disease: Secondary | ICD-10-CM | POA: Diagnosis not present

## 2015-06-08 DIAGNOSIS — R809 Proteinuria, unspecified: Secondary | ICD-10-CM | POA: Diagnosis not present

## 2015-06-08 DIAGNOSIS — E559 Vitamin D deficiency, unspecified: Secondary | ICD-10-CM | POA: Diagnosis not present

## 2015-06-08 DIAGNOSIS — N183 Chronic kidney disease, stage 3 (moderate): Secondary | ICD-10-CM | POA: Diagnosis not present

## 2015-06-08 DIAGNOSIS — Z79899 Other long term (current) drug therapy: Secondary | ICD-10-CM | POA: Diagnosis not present

## 2015-06-09 DIAGNOSIS — C44319 Basal cell carcinoma of skin of other parts of face: Secondary | ICD-10-CM | POA: Diagnosis not present

## 2015-06-14 DIAGNOSIS — D649 Anemia, unspecified: Secondary | ICD-10-CM | POA: Diagnosis not present

## 2015-06-14 DIAGNOSIS — R809 Proteinuria, unspecified: Secondary | ICD-10-CM | POA: Diagnosis not present

## 2015-06-14 DIAGNOSIS — I10 Essential (primary) hypertension: Secondary | ICD-10-CM | POA: Diagnosis not present

## 2015-06-14 DIAGNOSIS — N184 Chronic kidney disease, stage 4 (severe): Secondary | ICD-10-CM | POA: Diagnosis not present

## 2015-08-09 DIAGNOSIS — C44319 Basal cell carcinoma of skin of other parts of face: Secondary | ICD-10-CM | POA: Diagnosis not present

## 2015-09-30 DIAGNOSIS — E038 Other specified hypothyroidism: Secondary | ICD-10-CM | POA: Diagnosis not present

## 2015-09-30 DIAGNOSIS — C4490 Unspecified malignant neoplasm of skin, unspecified: Secondary | ICD-10-CM | POA: Diagnosis not present

## 2015-09-30 DIAGNOSIS — N189 Chronic kidney disease, unspecified: Secondary | ICD-10-CM | POA: Diagnosis not present

## 2015-09-30 DIAGNOSIS — I10 Essential (primary) hypertension: Secondary | ICD-10-CM | POA: Diagnosis not present

## 2015-10-11 DIAGNOSIS — I10 Essential (primary) hypertension: Secondary | ICD-10-CM | POA: Diagnosis not present

## 2015-10-11 DIAGNOSIS — E038 Other specified hypothyroidism: Secondary | ICD-10-CM | POA: Diagnosis not present

## 2015-10-11 DIAGNOSIS — N189 Chronic kidney disease, unspecified: Secondary | ICD-10-CM | POA: Diagnosis not present

## 2015-11-01 DIAGNOSIS — Z79899 Other long term (current) drug therapy: Secondary | ICD-10-CM | POA: Diagnosis not present

## 2015-11-01 DIAGNOSIS — N183 Chronic kidney disease, stage 3 (moderate): Secondary | ICD-10-CM | POA: Diagnosis not present

## 2015-11-01 DIAGNOSIS — E559 Vitamin D deficiency, unspecified: Secondary | ICD-10-CM | POA: Diagnosis not present

## 2015-11-01 DIAGNOSIS — I10 Essential (primary) hypertension: Secondary | ICD-10-CM | POA: Diagnosis not present

## 2015-11-01 DIAGNOSIS — R809 Proteinuria, unspecified: Secondary | ICD-10-CM | POA: Diagnosis not present

## 2015-11-01 DIAGNOSIS — D509 Iron deficiency anemia, unspecified: Secondary | ICD-10-CM | POA: Diagnosis not present

## 2015-11-03 DIAGNOSIS — H25012 Cortical age-related cataract, left eye: Secondary | ICD-10-CM | POA: Diagnosis not present

## 2015-11-03 DIAGNOSIS — H353112 Nonexudative age-related macular degeneration, right eye, intermediate dry stage: Secondary | ICD-10-CM | POA: Diagnosis not present

## 2015-11-03 DIAGNOSIS — H2512 Age-related nuclear cataract, left eye: Secondary | ICD-10-CM | POA: Diagnosis not present

## 2015-11-03 DIAGNOSIS — H25042 Posterior subcapsular polar age-related cataract, left eye: Secondary | ICD-10-CM | POA: Diagnosis not present

## 2015-11-03 DIAGNOSIS — H353122 Nonexudative age-related macular degeneration, left eye, intermediate dry stage: Secondary | ICD-10-CM | POA: Diagnosis not present

## 2015-11-08 DIAGNOSIS — N184 Chronic kidney disease, stage 4 (severe): Secondary | ICD-10-CM | POA: Diagnosis not present

## 2015-11-08 DIAGNOSIS — I10 Essential (primary) hypertension: Secondary | ICD-10-CM | POA: Diagnosis not present

## 2015-11-08 DIAGNOSIS — R809 Proteinuria, unspecified: Secondary | ICD-10-CM | POA: Diagnosis not present

## 2015-11-08 DIAGNOSIS — D649 Anemia, unspecified: Secondary | ICD-10-CM | POA: Diagnosis not present

## 2015-11-29 DIAGNOSIS — H25012 Cortical age-related cataract, left eye: Secondary | ICD-10-CM | POA: Diagnosis not present

## 2015-11-29 DIAGNOSIS — H2512 Age-related nuclear cataract, left eye: Secondary | ICD-10-CM | POA: Diagnosis not present

## 2015-11-29 DIAGNOSIS — H25042 Posterior subcapsular polar age-related cataract, left eye: Secondary | ICD-10-CM | POA: Diagnosis not present

## 2016-03-06 DIAGNOSIS — E559 Vitamin D deficiency, unspecified: Secondary | ICD-10-CM | POA: Diagnosis not present

## 2016-03-06 DIAGNOSIS — N183 Chronic kidney disease, stage 3 (moderate): Secondary | ICD-10-CM | POA: Diagnosis not present

## 2016-03-06 DIAGNOSIS — R809 Proteinuria, unspecified: Secondary | ICD-10-CM | POA: Diagnosis not present

## 2016-03-06 DIAGNOSIS — Z79899 Other long term (current) drug therapy: Secondary | ICD-10-CM | POA: Diagnosis not present

## 2016-03-06 DIAGNOSIS — I10 Essential (primary) hypertension: Secondary | ICD-10-CM | POA: Diagnosis not present

## 2016-03-06 DIAGNOSIS — D509 Iron deficiency anemia, unspecified: Secondary | ICD-10-CM | POA: Diagnosis not present

## 2016-03-13 DIAGNOSIS — I10 Essential (primary) hypertension: Secondary | ICD-10-CM | POA: Diagnosis not present

## 2016-03-13 DIAGNOSIS — N184 Chronic kidney disease, stage 4 (severe): Secondary | ICD-10-CM | POA: Diagnosis not present

## 2016-03-13 DIAGNOSIS — D649 Anemia, unspecified: Secondary | ICD-10-CM | POA: Diagnosis not present

## 2016-03-13 DIAGNOSIS — R809 Proteinuria, unspecified: Secondary | ICD-10-CM | POA: Diagnosis not present

## 2016-03-22 DIAGNOSIS — R197 Diarrhea, unspecified: Secondary | ICD-10-CM | POA: Diagnosis not present

## 2016-03-22 DIAGNOSIS — Z6827 Body mass index (BMI) 27.0-27.9, adult: Secondary | ICD-10-CM | POA: Diagnosis not present

## 2016-03-22 DIAGNOSIS — R1110 Vomiting, unspecified: Secondary | ICD-10-CM | POA: Diagnosis not present

## 2016-03-22 DIAGNOSIS — N189 Chronic kidney disease, unspecified: Secondary | ICD-10-CM | POA: Diagnosis not present

## 2016-04-09 DIAGNOSIS — E038 Other specified hypothyroidism: Secondary | ICD-10-CM | POA: Diagnosis not present

## 2016-04-13 DIAGNOSIS — Z6827 Body mass index (BMI) 27.0-27.9, adult: Secondary | ICD-10-CM | POA: Diagnosis not present

## 2016-04-13 DIAGNOSIS — I10 Essential (primary) hypertension: Secondary | ICD-10-CM | POA: Diagnosis not present

## 2016-04-13 DIAGNOSIS — E038 Other specified hypothyroidism: Secondary | ICD-10-CM | POA: Diagnosis not present

## 2016-04-13 DIAGNOSIS — N189 Chronic kidney disease, unspecified: Secondary | ICD-10-CM | POA: Diagnosis not present

## 2016-05-26 ENCOUNTER — Emergency Department (HOSPITAL_COMMUNITY)
Admission: EM | Admit: 2016-05-26 | Discharge: 2016-05-26 | Disposition: A | Discharge status: Home / Self Care | Payer: Medicare PPO | Attending: Emergency Medicine | Admitting: Emergency Medicine

## 2016-05-26 ENCOUNTER — Encounter (HOSPITAL_COMMUNITY): Payer: Self-pay | Admitting: Cardiology

## 2016-05-26 DIAGNOSIS — I129 Hypertensive chronic kidney disease with stage 1 through stage 4 chronic kidney disease, or unspecified chronic kidney disease: Secondary | ICD-10-CM | POA: Diagnosis not present

## 2016-05-26 DIAGNOSIS — Z7982 Long term (current) use of aspirin: Secondary | ICD-10-CM | POA: Insufficient documentation

## 2016-05-26 DIAGNOSIS — I2510 Atherosclerotic heart disease of native coronary artery without angina pectoris: Secondary | ICD-10-CM | POA: Insufficient documentation

## 2016-05-26 DIAGNOSIS — N189 Chronic kidney disease, unspecified: Secondary | ICD-10-CM | POA: Insufficient documentation

## 2016-05-26 DIAGNOSIS — Z859 Personal history of malignant neoplasm, unspecified: Secondary | ICD-10-CM | POA: Insufficient documentation

## 2016-05-26 DIAGNOSIS — Z79899 Other long term (current) drug therapy: Secondary | ICD-10-CM | POA: Insufficient documentation

## 2016-05-26 DIAGNOSIS — M542 Cervicalgia: Secondary | ICD-10-CM | POA: Diagnosis present

## 2016-05-26 DIAGNOSIS — Z7901 Long term (current) use of anticoagulants: Secondary | ICD-10-CM | POA: Insufficient documentation

## 2016-05-26 DIAGNOSIS — Z87891 Personal history of nicotine dependence: Secondary | ICD-10-CM | POA: Insufficient documentation

## 2016-05-26 MED ORDER — CYCLOBENZAPRINE HCL 10 MG PO TABS: 10.0000 mg | ORAL_TABLET | Freq: Two times a day (BID) | ORAL | 0 refills | Status: DC | PRN

## 2016-05-26 MED ORDER — PREDNISONE 10 MG PO TABS: 10.0000 mg | ORAL_TABLET | Freq: Every day | ORAL | 0 refills | Status: DC

## 2016-05-26 NOTE — ED Provider Notes (Signed)
[facility] DEPT Provider Note   CSN: [id number] Arrival date & time: 05/26/16  0807  By signing my name below, I, Higinio Plan, attest that this documentation has been prepared under the direction and in the presence of Nat Christen, MD . Electronically Signed: Higinio Plan, Scribe. 05/26/2016. 8:58 AM.  History   Chief Complaint Chief Complaint  Patient presents with  . Torticollis   The history is provided by the patient. No language interpreter was used.   HPI Comments: [name] is a 83 y.o. male with PMHx of CAD, AAA, CKD, HTN, and arthritis, who presents to the Emergency Department complaining of gradually worsening, left lateral neck pain that began ~2 days ago and worsened this morning. Pt reports his pain radiates into his left medial posterior shoulder but does not continue into his arm. He notes he used a chainsaw to "cut down a tree and clean it up" ~3 days before the onset of his symptoms and believes this could be the cause of his pain. He states he has not taken any medication to relieve his pain. He denies any recent fall, injury, or trauma to his neck. Pt reports he is followed by a nephrologist for his CKD and states his last creatinine level was 2.41.   Past Medical History:  Diagnosis Date  . AAA (abdominal aortic aneurysm) ([place])   . Arthritis   . Cancer ([place])    skin lesions removed  . Chronic kidney disease    Renal insufficiency Dr. Ray Church  . Coronary artery disease   . GERD (gastroesophageal reflux disease)   . Hemorrhoid   . Hypertension    Dr. Rory Percy, [place]  . Pneumonia    hx of pneumonia  . Thyroid disease     Patient Active Problem List   Diagnosis Date Noted  . Aftercare following surgery of the circulatory system, [place] 01/22/2012  . Embolism and thrombosis of arteries of lower extremity ([place]) 01/22/2012  . Ischemic foot 12/13/2011  . Elevated troponin 12/08/2011  . Cardiac enzymes elevated 12/07/2011  . Postoperative respiratory failure  ([place]) 12/06/2011  . AAA (abdominal aortic aneurysm) ([place]) 12/06/2011  . Ischemic leg 12/06/2011  . CKD 12/06/2011  . GERD (gastroesophageal reflux disease) 12/06/2011  . CAD (coronary artery disease) 12/06/2011  . HTN (hypertension) 12/06/2011  . Abdominal aneurysm without mention of rupture 08/14/2011    Past Surgical History:  Procedure Laterality Date  . ABDOMINAL AORTIC ANEURYSM REPAIR  12/05/2011   Procedure: ANEURYSM ABDOMINAL AORTIC REPAIR;  Surgeon: Mal Misty, MD;  Location[facility];  Service: Vascular;  Laterality: N/A;  Resection and Grafting of Abdominal Aortic Aneurysm using  18x92mm x 40cm Hemashielpd Gold Vascular Graft  . ABDOMINAL AORTIC ANEURYSM REPAIR    . AMPUTATION  02/20/2012   Procedure: AMPUTATION DIGIT;  Surgeon: Newt Minion, MD;  Location: [place];  Service: Orthopedics;  Laterality: Right;  Amputation Toes 1-4 Right Foot  . ARTERY EXPLORATION  12/05/2011   Procedure: ARTERY EXPLORATION;  Surgeon: Mal Misty, MD;  Location: [facility] OR;  Service: Vascular;  Laterality: Right;  Right Popliteal Artery Exploration  . EMBOLECTOMY  12/05/2011   Procedure: EMBOLECTOMY;  Surgeon: Mal Misty, MD;  Location: [place];  Service: Vascular;  Laterality: Right;  . EMBOLECTOMY  12/05/2011   Procedure: EMBOLECTOMY;  Surgeon: Mal Misty, MD;  Location: [place];  Service: Vascular;  Laterality: Right;  Thrombectomy of right anterior/posterior Tibial Arterys with vein patch angioplasty of tibial/peroneal trunk.  Marland Kitchen  EYE SURGERY  ~ 1 year   cataract  . INTRAOPERATIVE ARTERIOGRAM  12/05/2011   Procedure: INTRA OPERATIVE ARTERIOGRAM;  Surgeon: Mal Misty, MD;  Location: [place];  Service: Vascular;  Laterality: Right;  . MOUTH SURGERY      Home Medications    Prior to Admission medications   Medication Sig Start Date End Date Taking? Authorizing Provider  amLODipine (NORVASC) 10 MG tablet Take 10 mg by mouth daily.    Historical Provider, MD  aspirin 81 MG tablet Take 81 mg by  mouth daily.    Historical Provider, MD  Cholecalciferol (VITAMIN D3) 1000 units CAPS Take 1,000 Units by mouth daily.    Historical Provider, MD  cyclobenzaprine (FLEXERIL) 10 MG tablet Take 1 tablet (10 mg total) by mouth 2 (two) times daily as needed for muscle spasms. 05/26/16   Nat Christen, MD  fish oil-omega-3 fatty acids 1000 MG capsule Take 2 g by mouth daily.    Historical Provider, MD  furosemide (LASIX) 20 MG tablet Take 20 mg by mouth as needed.    Historical Provider, MD  levothyroxine (SYNTHROID, LEVOTHROID) 150 MCG tablet Take 150 mcg by mouth daily.    Historical Provider, MD  lisinopril-hydrochlorothiazide (PRINZIDE,ZESTORETIC) 10-12.5 MG per tablet Take 1 tablet by mouth daily.    Historical Provider, MD  Multiple Vitamin (MULTIVITAMIN) tablet Take 1 tablet by mouth daily.    Historical Provider, MD  predniSONE (DELTASONE) 10 MG tablet Take 1 tablet (10 mg total) by mouth daily with breakfast. 05/26/16   Nat Christen, MD  ranitidine (ZANTAC) 150 MG tablet Take 150 mg by mouth 2 (two) times daily.    Historical Provider, MD  warfarin (COUMADIN) 2 MG tablet Take 2 mg by mouth daily.  12/19/11   Bary Leriche, PA-C    Family History Family History  Problem Relation Age of Onset  . Coronary artery disease Mother   . Kidney disease Mother   . Heart attack Mother   . Stroke Father   . Hypertension Father     Social History Social History  Substance Use Topics  . Smoking status: Former Smoker    Packs/day: 1.00    Types: Cigarettes    Quit date: 07/22/1973  . Smokeless tobacco: Never Used  . Alcohol use No     Allergies   Patient has no known allergies.   Review of Systems Review of Systems  Constitutional: Negative for fever.  Musculoskeletal: Positive for arthralgias and neck pain.   Physical Exam Updated Vital Signs BP 167/81 (BP Location: Left Arm)   Pulse 62   Temp 97.5 F (36.4 C) (Oral)   Resp 18   Ht 6' (1.829 m)   Wt 200 lb (90.7 kg)   SpO2 96%   BMI  27.12 kg/m   Physical Exam  Constitutional: He is oriented to person, place, and time. He appears well-developed and well-nourished.  HENT:  Head: Normocephalic and atraumatic.  Eyes: Conjunctivae are normal.  Neck: Neck supple.  Tender in left lateral inferior neck   Musculoskeletal: Normal range of motion.  Neurological: He is alert and oriented to person, place, and time.  Skin: Skin is warm and dry.  Psychiatric: He has a normal mood and affect. His behavior is normal.  Nursing note and vitals reviewed.  ED Treatments / Results  DIAGNOSTIC STUDIES:  Oxygen Saturation is 96% on RA, normal by my interpretation.    COORDINATION OF CARE:  8:32 AM Discussed treatment plan with pt at  bedside and pt agreed to plan.  Labs (all labs ordered are listed, but only abnormal results are displayed) Labs Reviewed - No data to display  EKG  EKG Interpretation None       Radiology No results found.  Procedures Procedures (including critical care time)  Medications Ordered in ED Medications - No data to display  Initial Impression / Assessment and Plan / ED Course  I have reviewed the triage vital signs and the nursing notes.  Pertinent labs & imaging results that were available during my care of the patient were reviewed by me and considered in my medical decision making (see chart for details).     Will prescribe prednisone and a muscle relaxer for pain management. Advised patient to take Tylenol rather than Ibuprofen as well. Heating pad.  I personally performed the services described in this documentation, which was scribed in my presence. The recorded information has been reviewed and is accurate.    Final Clinical Impressions(s) / ED Diagnoses   Final diagnoses:  Neck pain    New Prescriptions New Prescriptions   CYCLOBENZAPRINE (FLEXERIL) 10 MG TABLET    Take 1 tablet (10 mg total) by mouth 2 (two) times daily as needed for muscle spasms.   PREDNISONE  (DELTASONE) 10 MG TABLET    Take 1 tablet (10 mg total) by mouth daily with breakfast.     Nat Christen, MD 05/26/16 [phone]

## 2016-05-26 NOTE — ED Triage Notes (Signed)
Neck pain times 2 days.  Worse since 3am.  States he ran a chainsaw prior to neck pain.  But denies any injury.

## 2016-05-26 NOTE — Discharge Instructions (Signed)
Prescription for prednisone and muscle relaxer. Heating pad. Follow-up your primary care doctor.

## 2017-05-20 DIAGNOSIS — D509 Iron deficiency anemia, unspecified: Secondary | ICD-10-CM | POA: Diagnosis not present

## 2017-05-20 DIAGNOSIS — E559 Vitamin D deficiency, unspecified: Secondary | ICD-10-CM | POA: Diagnosis not present

## 2017-05-20 DIAGNOSIS — I10 Essential (primary) hypertension: Secondary | ICD-10-CM | POA: Diagnosis not present

## 2017-05-20 DIAGNOSIS — R809 Proteinuria, unspecified: Secondary | ICD-10-CM | POA: Diagnosis not present

## 2017-05-20 DIAGNOSIS — Z79899 Other long term (current) drug therapy: Secondary | ICD-10-CM | POA: Diagnosis not present

## 2017-05-20 DIAGNOSIS — N183 Chronic kidney disease, stage 3 (moderate): Secondary | ICD-10-CM | POA: Diagnosis not present

## 2017-05-28 DIAGNOSIS — I10 Essential (primary) hypertension: Secondary | ICD-10-CM | POA: Diagnosis not present

## 2017-05-28 DIAGNOSIS — N184 Chronic kidney disease, stage 4 (severe): Secondary | ICD-10-CM | POA: Diagnosis not present

## 2017-05-28 DIAGNOSIS — D649 Anemia, unspecified: Secondary | ICD-10-CM | POA: Diagnosis not present

## 2017-05-28 DIAGNOSIS — M9080 Osteopathy in diseases classified elsewhere, unspecified site: Secondary | ICD-10-CM | POA: Diagnosis not present

## 2017-05-28 DIAGNOSIS — E875 Hyperkalemia: Secondary | ICD-10-CM | POA: Diagnosis not present

## 2017-05-28 DIAGNOSIS — R809 Proteinuria, unspecified: Secondary | ICD-10-CM | POA: Diagnosis not present

## 2017-05-28 DIAGNOSIS — E889 Metabolic disorder, unspecified: Secondary | ICD-10-CM | POA: Diagnosis not present

## 2017-05-29 DIAGNOSIS — E039 Hypothyroidism, unspecified: Secondary | ICD-10-CM | POA: Diagnosis not present

## 2017-05-29 DIAGNOSIS — Z6827 Body mass index (BMI) 27.0-27.9, adult: Secondary | ICD-10-CM | POA: Diagnosis not present

## 2017-05-29 DIAGNOSIS — I10 Essential (primary) hypertension: Secondary | ICD-10-CM | POA: Diagnosis not present

## 2017-05-29 DIAGNOSIS — K219 Gastro-esophageal reflux disease without esophagitis: Secondary | ICD-10-CM | POA: Diagnosis not present

## 2017-05-29 DIAGNOSIS — Z89421 Acquired absence of other right toe(s): Secondary | ICD-10-CM | POA: Diagnosis not present

## 2017-05-29 DIAGNOSIS — E663 Overweight: Secondary | ICD-10-CM | POA: Diagnosis not present

## 2017-05-29 DIAGNOSIS — H9193 Unspecified hearing loss, bilateral: Secondary | ICD-10-CM | POA: Diagnosis not present

## 2017-08-05 DIAGNOSIS — L821 Other seborrheic keratosis: Secondary | ICD-10-CM | POA: Diagnosis not present

## 2017-08-05 DIAGNOSIS — D485 Neoplasm of uncertain behavior of skin: Secondary | ICD-10-CM | POA: Diagnosis not present

## 2017-08-05 DIAGNOSIS — L570 Actinic keratosis: Secondary | ICD-10-CM | POA: Diagnosis not present

## 2017-08-05 DIAGNOSIS — Z85828 Personal history of other malignant neoplasm of skin: Secondary | ICD-10-CM | POA: Diagnosis not present

## 2017-09-18 DIAGNOSIS — E559 Vitamin D deficiency, unspecified: Secondary | ICD-10-CM | POA: Diagnosis not present

## 2017-09-18 DIAGNOSIS — D509 Iron deficiency anemia, unspecified: Secondary | ICD-10-CM | POA: Diagnosis not present

## 2017-09-18 DIAGNOSIS — I10 Essential (primary) hypertension: Secondary | ICD-10-CM | POA: Diagnosis not present

## 2017-09-18 DIAGNOSIS — Z79899 Other long term (current) drug therapy: Secondary | ICD-10-CM | POA: Diagnosis not present

## 2017-09-18 DIAGNOSIS — R809 Proteinuria, unspecified: Secondary | ICD-10-CM | POA: Diagnosis not present

## 2017-09-18 DIAGNOSIS — N184 Chronic kidney disease, stage 4 (severe): Secondary | ICD-10-CM | POA: Diagnosis not present

## 2017-09-24 DIAGNOSIS — E875 Hyperkalemia: Secondary | ICD-10-CM | POA: Diagnosis not present

## 2017-09-24 DIAGNOSIS — D649 Anemia, unspecified: Secondary | ICD-10-CM | POA: Diagnosis not present

## 2017-09-24 DIAGNOSIS — I509 Heart failure, unspecified: Secondary | ICD-10-CM | POA: Diagnosis not present

## 2017-09-24 DIAGNOSIS — I10 Essential (primary) hypertension: Secondary | ICD-10-CM | POA: Diagnosis not present

## 2017-09-24 DIAGNOSIS — N183 Chronic kidney disease, stage 3 (moderate): Secondary | ICD-10-CM | POA: Diagnosis not present

## 2017-09-24 DIAGNOSIS — R809 Proteinuria, unspecified: Secondary | ICD-10-CM | POA: Diagnosis not present

## 2017-09-26 DIAGNOSIS — Z6827 Body mass index (BMI) 27.0-27.9, adult: Secondary | ICD-10-CM | POA: Diagnosis not present

## 2017-09-26 DIAGNOSIS — Z0001 Encounter for general adult medical examination with abnormal findings: Secondary | ICD-10-CM | POA: Diagnosis not present

## 2017-09-26 DIAGNOSIS — I10 Essential (primary) hypertension: Secondary | ICD-10-CM | POA: Diagnosis not present

## 2017-09-26 DIAGNOSIS — N189 Chronic kidney disease, unspecified: Secondary | ICD-10-CM | POA: Diagnosis not present

## 2017-09-26 DIAGNOSIS — E038 Other specified hypothyroidism: Secondary | ICD-10-CM | POA: Diagnosis not present

## 2017-09-26 DIAGNOSIS — R5383 Other fatigue: Secondary | ICD-10-CM | POA: Diagnosis not present

## 2017-09-30 DIAGNOSIS — N189 Chronic kidney disease, unspecified: Secondary | ICD-10-CM | POA: Diagnosis not present

## 2017-09-30 DIAGNOSIS — Z6827 Body mass index (BMI) 27.0-27.9, adult: Secondary | ICD-10-CM | POA: Diagnosis not present

## 2017-09-30 DIAGNOSIS — H9193 Unspecified hearing loss, bilateral: Secondary | ICD-10-CM | POA: Diagnosis not present

## 2017-09-30 DIAGNOSIS — E038 Other specified hypothyroidism: Secondary | ICD-10-CM | POA: Diagnosis not present

## 2017-09-30 DIAGNOSIS — Z0001 Encounter for general adult medical examination with abnormal findings: Secondary | ICD-10-CM | POA: Diagnosis not present

## 2017-09-30 DIAGNOSIS — I10 Essential (primary) hypertension: Secondary | ICD-10-CM | POA: Diagnosis not present

## 2017-11-19 DIAGNOSIS — L299 Pruritus, unspecified: Secondary | ICD-10-CM | POA: Diagnosis not present

## 2017-11-19 DIAGNOSIS — Z6827 Body mass index (BMI) 27.0-27.9, adult: Secondary | ICD-10-CM | POA: Diagnosis not present

## 2018-01-28 DIAGNOSIS — N183 Chronic kidney disease, stage 3 (moderate): Secondary | ICD-10-CM | POA: Diagnosis not present

## 2018-01-28 DIAGNOSIS — D509 Iron deficiency anemia, unspecified: Secondary | ICD-10-CM | POA: Diagnosis not present

## 2018-01-28 DIAGNOSIS — I10 Essential (primary) hypertension: Secondary | ICD-10-CM | POA: Diagnosis not present

## 2018-01-28 DIAGNOSIS — E559 Vitamin D deficiency, unspecified: Secondary | ICD-10-CM | POA: Diagnosis not present

## 2018-01-28 DIAGNOSIS — Z79899 Other long term (current) drug therapy: Secondary | ICD-10-CM | POA: Diagnosis not present

## 2018-01-28 DIAGNOSIS — R809 Proteinuria, unspecified: Secondary | ICD-10-CM | POA: Diagnosis not present

## 2018-02-04 DIAGNOSIS — L570 Actinic keratosis: Secondary | ICD-10-CM | POA: Diagnosis not present

## 2018-02-04 DIAGNOSIS — I10 Essential (primary) hypertension: Secondary | ICD-10-CM | POA: Diagnosis not present

## 2018-02-04 DIAGNOSIS — R809 Proteinuria, unspecified: Secondary | ICD-10-CM | POA: Diagnosis not present

## 2018-02-04 DIAGNOSIS — D649 Anemia, unspecified: Secondary | ICD-10-CM | POA: Diagnosis not present

## 2018-02-04 DIAGNOSIS — N183 Chronic kidney disease, stage 3 (moderate): Secondary | ICD-10-CM | POA: Diagnosis not present

## 2018-03-11 DIAGNOSIS — J069 Acute upper respiratory infection, unspecified: Secondary | ICD-10-CM | POA: Diagnosis not present

## 2018-03-11 DIAGNOSIS — Z6827 Body mass index (BMI) 27.0-27.9, adult: Secondary | ICD-10-CM | POA: Diagnosis not present

## 2018-06-03 DIAGNOSIS — E559 Vitamin D deficiency, unspecified: Secondary | ICD-10-CM | POA: Diagnosis not present

## 2018-06-03 DIAGNOSIS — N183 Chronic kidney disease, stage 3 (moderate): Secondary | ICD-10-CM | POA: Diagnosis not present

## 2018-06-03 DIAGNOSIS — R809 Proteinuria, unspecified: Secondary | ICD-10-CM | POA: Diagnosis not present

## 2018-06-03 DIAGNOSIS — Z79899 Other long term (current) drug therapy: Secondary | ICD-10-CM | POA: Diagnosis not present

## 2018-06-03 DIAGNOSIS — D509 Iron deficiency anemia, unspecified: Secondary | ICD-10-CM | POA: Diagnosis not present

## 2018-06-03 DIAGNOSIS — I10 Essential (primary) hypertension: Secondary | ICD-10-CM | POA: Diagnosis not present

## 2018-06-10 DIAGNOSIS — E875 Hyperkalemia: Secondary | ICD-10-CM | POA: Diagnosis not present

## 2018-06-10 DIAGNOSIS — R809 Proteinuria, unspecified: Secondary | ICD-10-CM | POA: Diagnosis not present

## 2018-06-10 DIAGNOSIS — N184 Chronic kidney disease, stage 4 (severe): Secondary | ICD-10-CM | POA: Diagnosis not present

## 2018-06-10 DIAGNOSIS — I10 Essential (primary) hypertension: Secondary | ICD-10-CM | POA: Diagnosis not present

## 2018-07-03 DIAGNOSIS — I129 Hypertensive chronic kidney disease with stage 1 through stage 4 chronic kidney disease, or unspecified chronic kidney disease: Secondary | ICD-10-CM | POA: Diagnosis not present

## 2018-07-03 DIAGNOSIS — R1310 Dysphagia, unspecified: Secondary | ICD-10-CM | POA: Diagnosis not present

## 2018-07-03 DIAGNOSIS — H9201 Otalgia, right ear: Secondary | ICD-10-CM | POA: Diagnosis not present

## 2018-07-03 DIAGNOSIS — Z87891 Personal history of nicotine dependence: Secondary | ICD-10-CM | POA: Diagnosis not present

## 2018-07-03 DIAGNOSIS — N182 Chronic kidney disease, stage 2 (mild): Secondary | ICD-10-CM | POA: Diagnosis not present

## 2018-07-07 DIAGNOSIS — Z823 Family history of stroke: Secondary | ICD-10-CM | POA: Diagnosis not present

## 2018-07-07 DIAGNOSIS — R51 Headache: Secondary | ICD-10-CM | POA: Diagnosis not present

## 2018-07-07 DIAGNOSIS — Z6826 Body mass index (BMI) 26.0-26.9, adult: Secondary | ICD-10-CM | POA: Diagnosis not present

## 2018-07-07 DIAGNOSIS — R1310 Dysphagia, unspecified: Secondary | ICD-10-CM | POA: Diagnosis not present

## 2018-07-08 ENCOUNTER — Encounter: Payer: Self-pay | Admitting: Gastroenterology

## 2018-07-08 DIAGNOSIS — I639 Cerebral infarction, unspecified: Secondary | ICD-10-CM | POA: Diagnosis not present

## 2018-07-08 DIAGNOSIS — R51 Headache: Secondary | ICD-10-CM | POA: Diagnosis not present

## 2018-07-08 DIAGNOSIS — R1031 Right lower quadrant pain: Secondary | ICD-10-CM | POA: Diagnosis not present

## 2018-07-09 ENCOUNTER — Telehealth: Payer: Self-pay

## 2018-07-09 ENCOUNTER — Other Ambulatory Visit: Payer: Self-pay

## 2018-07-09 ENCOUNTER — Encounter: Payer: Self-pay | Admitting: Gastroenterology

## 2018-07-09 ENCOUNTER — Ambulatory Visit (INDEPENDENT_AMBULATORY_CARE_PROVIDER_SITE_OTHER): Payer: Medicare HMO | Admitting: Gastroenterology

## 2018-07-09 DIAGNOSIS — R1310 Dysphagia, unspecified: Secondary | ICD-10-CM | POA: Diagnosis not present

## 2018-07-09 NOTE — Progress Notes (Signed)
REVIEWED. EGD/DIL FOR DYSPHAGIA.  Primary Care Physician:  Allwardt, Alyssa, PA Primary Gastroenterologist:  Dr. Oneida Alar   Chief Complaint  Patient presents with  . Dysphagia    with foods/carbonated drinks x 2 weeks.     HPI:   [name] is an 85 y.o. male presenting today at the request of Alyssa Allwardt, PA, due to new onset dysphagia. Present with him today is his wife, Earlie Server, and Marya Amsler, his son.  He reports 3 weeks ago working out in the yard and experienced pain in shoulder and behind right ear. He went to [facility] ED 07/03/18 with normal EKG. MRI head without contrast completed yesterday, 07/08/18, with no acute findings and largely unremarkable for age. He notes that new onset dysphagia occurred around this time.  States he does well with jello and liquids. Feels solid foods hanging in esophagus. Unable to drink carbonated drinks. Ate small amount of pizza last night and salad and felt slow going down. NO odynophagia. No abdominal pain. Early satiety noted. Has lost 5 lbs in last 2-3 weeks. Decreased food intake. No overt GI bleeding. No prior EGD.   Past Medical History:  Diagnosis Date  . AAA (abdominal aortic aneurysm) ([place])   . Arthritis   . Blood clot in vein    at time of AAA surgery, requiring toe amputation  . Cancer ([place])    skin lesions removed  . Chronic kidney disease    Renal insufficiency Dr. Ray Church  . Coronary artery disease   . GERD (gastroesophageal reflux disease)   . Hemorrhoid   . Hypertension    Dr. Rory Percy, [place]  . Pneumonia    hx of pneumonia  . Thyroid disease     Past Surgical History:  Procedure Laterality Date  . ABDOMINAL AORTIC ANEURYSM REPAIR  12/05/2011   Procedure: ANEURYSM ABDOMINAL AORTIC REPAIR;  Surgeon: Mal Misty, MD;  Location[facility] OR;  Service: Vascular;  Laterality: N/A;  Resection and Grafting of Abdominal Aortic Aneurysm using  18x84mm x 40cm Hemashielpd Gold Vascular Graft  . ABDOMINAL AORTIC ANEURYSM  REPAIR    . AMPUTATION  02/20/2012   Procedure: AMPUTATION DIGIT;  Surgeon: Newt Minion, MD;  Location: [place];  Service: Orthopedics;  Laterality: Right;  Amputation Toes 1-4 Right Foot  . ARTERY EXPLORATION  12/05/2011   Procedure: ARTERY EXPLORATION;  Surgeon: Mal Misty, MD;  Location: [facility] OR;  Service: Vascular;  Laterality: Right;  Right Popliteal Artery Exploration  . EMBOLECTOMY  12/05/2011   Procedure: EMBOLECTOMY;  Surgeon: Mal Misty, MD;  Location: [place];  Service: Vascular;  Laterality: Right;  . EMBOLECTOMY  12/05/2011   Procedure: EMBOLECTOMY;  Surgeon: Mal Misty, MD;  Location: [place];  Service: Vascular;  Laterality: Right;  Thrombectomy of right anterior/posterior Tibial Arterys with vein patch angioplasty of tibial/peroneal trunk.  Marland Kitchen EYE SURGERY  ~ 1 year   cataract  . INTRAOPERATIVE ARTERIOGRAM  12/05/2011   Procedure: INTRA OPERATIVE ARTERIOGRAM;  Surgeon: Mal Misty, MD;  Location: [place];  Service: Vascular;  Laterality: Right;  . MOUTH SURGERY      Current Outpatient Medications  Medication Sig Dispense Refill  . amLODipine (NORVASC) 10 MG tablet Take 10 mg by mouth daily.    . Cholecalciferol (VITAMIN D3) 1000 units CAPS Take 1,000 Units by mouth daily.    . fish oil-omega-3 fatty acids 1000 MG capsule Take 2 g by mouth daily.    . furosemide (LASIX) 20 MG tablet  Take 10 mg by mouth every other day.     . levothyroxine (SYNTHROID, LEVOTHROID) 150 MCG tablet Take 150 mcg by mouth daily before breakfast.     . Multiple Vitamin (MULTIVITAMIN) tablet Take 1 tablet by mouth daily.    . ranitidine (ZANTAC) 150 MG tablet Take 150 mg by mouth 2 (two) times daily.     No current facility-administered medications for this visit.     Allergies as of 07/09/2018  . (No Known Allergies)    Family History  Problem Relation Age of Onset  . Coronary artery disease Mother   . Kidney disease Mother   . Heart attack Mother   . Stroke Father   . Hypertension  Father   . Colon cancer Neg Hx   . Colon polyps Neg Hx     Social History   Socioeconomic History  . Marital status: Married    Spouse name: Not on file  . Number of children: Not on file  . Years of education: Not on file  . Highest education level: Not on file  Occupational History  . Not on file  Social Needs  . Financial resource strain: Not on file  . Food insecurity:    Worry: Not on file    Inability: Not on file  . Transportation needs:    Medical: Not on file    Non-medical: Not on file  Tobacco Use  . Smoking status: Former Smoker    Packs/day: 1.00    Types: Cigarettes    Last attempt to quit: 07/22/1973    Years since quitting: 44.9  . Smokeless tobacco: Never Used  Substance and Sexual Activity  . Alcohol use: No    Alcohol/week: 0.0 standard drinks  . Drug use: No  . Sexual activity: Not on file  Lifestyle  . Physical activity:    Days per week: Not on file    Minutes per session: Not on file  . Stress: Not on file  Relationships  . Social connections:    Talks on phone: Not on file    Gets together: Not on file    Attends religious service: Not on file    Active member of club or organization: Not on file    Attends meetings of clubs or organizations: Not on file    Relationship status: Not on file  . Intimate partner violence:    Fear of current or ex partner: Not on file    Emotionally abused: Not on file    Physically abused: Not on file    Forced sexual activity: Not on file  Other Topics Concern  . Not on file  Social History Narrative  . Not on file    Review of Systems: Gen: Denies any fever, chills, fatigue, weight loss, lack of appetite.  CV: Denies chest pain, heart palpitations, peripheral edema, syncope.  Resp: Denies shortness of breath at rest or with exertion. Denies wheezing or cough.  GI: see HPI GU : Denies urinary burning, urinary frequency, urinary hesitancy MS: Denies joint pain, muscle weakness, cramps, or limitation  of movement.  Derm: Denies rash, itching, dry skin Psych: Denies depression, anxiety, memory loss, and confusion Heme: Denies bruising, bleeding, and enlarged lymph nodes.  Physical Exam: BP (!) 150/69   Pulse (!) 59   Temp (!) 97.1 F (36.2 C) (Oral)   Ht 6' (1.829 m)   Wt 194 lb (88 kg)   BMI 26.31 kg/m  General:   Alert and oriented. Pleasant and  cooperative. Well-nourished and well-developed.  Head:  Normocephalic and atraumatic. Eyes:  Without icterus, sclera clear and conjunctiva pink.  Ears:  Mild HOH Nose:  No deformity, discharge,  or lesions. Mouth:   oral mucosa pink.  Neck:  Supple, without mass  Lungs:  Clear to auscultation bilaterally.  Heart:  S1, S2 present without murmurs appreciated.  Abdomen:  +BS, soft, non-tender and non-distended. No HSM noted. No guarding or rebound. No masses appreciated.  Rectal:  Deferred  Msk:  Slight kyphosis  Extremities:  Without  edema. Neurologic:  Alert and  oriented x4 Psych:  Alert and cooperative. Normal mood and affect.

## 2018-07-09 NOTE — Patient Instructions (Signed)
PA for EGD submitted via [employer] website. Case approved. PA# [id number], 07/14/18-08/13/18.

## 2018-07-09 NOTE — Telephone Encounter (Signed)
[name] at pre-service center called office for PA of upcoming EGD. Called [name] back and gave her PA number.

## 2018-07-09 NOTE — Patient Instructions (Signed)
We have arranged an upper endoscopy in the near future.  Sit upright while eating, chew well and take small bites, and stick with soft foods.  Further recommendations to follow!  It was a pleasure to see you today. I strive to create trusting relationships with patients to provide genuine, compassionate, and quality care. I value your feedback. If you receive a survey regarding your visit,  I greatly appreciate you taking time to fill this out.   Annitta Needs, PhD, ANP-BC [facility]

## 2018-07-10 ENCOUNTER — Telehealth: Payer: Self-pay

## 2018-07-10 NOTE — Progress Notes (Signed)
cc'd to pcp 

## 2018-07-10 NOTE — Assessment & Plan Note (Signed)
85 year old male with new onset dysphagia, denying odynophagia, onset 3 weeks ago. He notes this coinciding with shoulder and right ear discomfort after working in the yard 3 weeks ago. Doubt related. MRI head at [facility] ordered by PCP and negative. No imaging of neck completed. Currently tolerating liquids and soft foods. Early satiety and decreased appetite noted. Will pursue EGD in near future. May need further imaging if EGD unrevealing.   Proceed with upper endoscopy in the near future with Dr. Oneida Alar. The risks, benefits, and alternatives have been discussed in detail with patient. They have stated understanding and desire to proceed.

## 2018-07-10 NOTE — Telephone Encounter (Signed)
Called pt, EGD/DIL for 07/14/18 moved to 07/11/18 at 9:30am. Pt will arrive at 8:30am. Verbal instructions given for pt to have clear liquids only (no red) 6:00pm till midnight today and NPO after midnight. Instructions given to pt and his wife. Endo scheduler informed.

## 2018-07-11 ENCOUNTER — Encounter (HOSPITAL_COMMUNITY): Payer: Self-pay | Admitting: *Deleted

## 2018-07-11 ENCOUNTER — Ambulatory Visit (HOSPITAL_COMMUNITY)
Admission: RE | Admit: 2018-07-11 | Discharge: 2018-07-11 | Disposition: A | Discharge status: Home / Self Care | Payer: Medicare HMO | Attending: Gastroenterology | Admitting: Gastroenterology

## 2018-07-11 ENCOUNTER — Other Ambulatory Visit: Payer: Self-pay

## 2018-07-11 ENCOUNTER — Encounter (HOSPITAL_COMMUNITY)
Admission: RE | Disposition: A | Discharge status: Home / Self Care | Payer: Self-pay | Source: Home / Self Care | Attending: Gastroenterology

## 2018-07-11 DIAGNOSIS — K229 Disease of esophagus, unspecified: Secondary | ICD-10-CM

## 2018-07-11 DIAGNOSIS — I129 Hypertensive chronic kidney disease with stage 1 through stage 4 chronic kidney disease, or unspecified chronic kidney disease: Secondary | ICD-10-CM | POA: Diagnosis not present

## 2018-07-11 DIAGNOSIS — T18128A Food in esophagus causing other injury, initial encounter: Secondary | ICD-10-CM

## 2018-07-11 DIAGNOSIS — K219 Gastro-esophageal reflux disease without esophagitis: Secondary | ICD-10-CM | POA: Insufficient documentation

## 2018-07-11 DIAGNOSIS — R1310 Dysphagia, unspecified: Secondary | ICD-10-CM | POA: Diagnosis not present

## 2018-07-11 DIAGNOSIS — E079 Disorder of thyroid, unspecified: Secondary | ICD-10-CM | POA: Diagnosis not present

## 2018-07-11 DIAGNOSIS — B3781 Candidal esophagitis: Secondary | ICD-10-CM | POA: Diagnosis not present

## 2018-07-11 DIAGNOSIS — Z87891 Personal history of nicotine dependence: Secondary | ICD-10-CM | POA: Diagnosis not present

## 2018-07-11 DIAGNOSIS — Z79899 Other long term (current) drug therapy: Secondary | ICD-10-CM | POA: Insufficient documentation

## 2018-07-11 DIAGNOSIS — I2510 Atherosclerotic heart disease of native coronary artery without angina pectoris: Secondary | ICD-10-CM | POA: Insufficient documentation

## 2018-07-11 DIAGNOSIS — K2970 Gastritis, unspecified, without bleeding: Secondary | ICD-10-CM | POA: Diagnosis not present

## 2018-07-11 DIAGNOSIS — N189 Chronic kidney disease, unspecified: Secondary | ICD-10-CM | POA: Insufficient documentation

## 2018-07-11 DIAGNOSIS — Z79890 Hormone replacement therapy (postmenopausal): Secondary | ICD-10-CM | POA: Diagnosis not present

## 2018-07-11 DIAGNOSIS — Z85828 Personal history of other malignant neoplasm of skin: Secondary | ICD-10-CM | POA: Insufficient documentation

## 2018-07-11 DIAGNOSIS — M1990 Unspecified osteoarthritis, unspecified site: Secondary | ICD-10-CM | POA: Insufficient documentation

## 2018-07-11 HISTORY — PX: FOREIGN BODY REMOVAL: SHX962

## 2018-07-11 HISTORY — PX: ESOPHAGEAL BRUSHING: SHX6842

## 2018-07-11 HISTORY — PX: ESOPHAGOGASTRODUODENOSCOPY: SHX5428

## 2018-07-11 LAB — KOH PREP

## 2018-07-11 SURGERY — EGD (ESOPHAGOGASTRODUODENOSCOPY)
Anesthesia: Moderate Sedation

## 2018-07-11 MED ORDER — FLUCONAZOLE 100 MG PO TABS: ORAL_TABLET | ORAL | 0 refills | Status: DC

## 2018-07-11 MED ORDER — MINERAL OIL PO OIL
TOPICAL_OIL | ORAL | Status: AC
  Filled 2018-07-11: qty 30

## 2018-07-11 MED ORDER — MIDAZOLAM HCL 5 MG/5ML IJ SOLN
INTRAMUSCULAR | Status: AC
  Filled 2018-07-11: qty 5

## 2018-07-11 MED ORDER — LIDOCAINE VISCOUS HCL 2 % MT SOLN
OROMUCOSAL | Status: DC | PRN
  Administered 2018-07-11: 1 via OROMUCOSAL

## 2018-07-11 MED ORDER — SODIUM CHLORIDE 0.9 % IV SOLN
INTRAVENOUS | Status: DC
  Administered 2018-07-11: 09:00:00 via INTRAVENOUS

## 2018-07-11 MED ORDER — LIDOCAINE VISCOUS HCL 2 % MT SOLN
OROMUCOSAL | Status: AC
  Filled 2018-07-11: qty 15

## 2018-07-11 MED ORDER — MEPERIDINE HCL 100 MG/ML IJ SOLN
INTRAMUSCULAR | Status: AC
  Filled 2018-07-11: qty 1

## 2018-07-11 MED ORDER — MIDAZOLAM HCL 5 MG/5ML IJ SOLN
INTRAMUSCULAR | Status: DC | PRN
  Administered 2018-07-11: 2 mg via INTRAVENOUS

## 2018-07-11 MED ORDER — MEPERIDINE HCL 100 MG/ML IJ SOLN
INTRAMUSCULAR | Status: DC | PRN
  Administered 2018-07-11: 25 mg

## 2018-07-11 NOTE — Discharge Instructions (Signed)
I BRUSHED YOUR ESOPHAGUS AND THE RESULT ARE ALREADY BACK. YOUR DIFFICULTY SWALLOWING IS DUE TO YEAST IN YOUR ESOPHAGUS. I HAD TO REMOVE FOOD FROM YOUR ESOPHAGUS. You have gastritis. I BIOPSIED YOUR STOMACH.   DRINK WATER TO KEEP YOUR URINE LIGHT YELLOW.  FOLLOW A SOFT MECHANICAL DIET UNTIL YOUR NEXT OFFICE VISIT.  MEATS SHOULD BE GROUND ONLY.  DO NOT EAT CHUNKS OF ANYTHING. SEE INFO BELOW.    TO TREAT YEAST IN THE ESOPHAGUS, ADD DIFLUCAN 200 MG TODAY THEN 100 MG DAILY FOR 20 DAYS. YOUR SWALLOWING SHOULD BE IMPROVED BY Tuesday BUT COMPLETE ALL OF THE DIFLUCAN. THE ONLY SIDE EFFECT IS IT CAN IRRITATE YOUR LIVER. PLEAS CALL IF YOU THINK YOUR EYES OR SKIN ARE YELLOW.   YOUR STOMACH BIOPSY RESULTS WILL BE BACK IN 5 BUSINESS DAYS.  PLEAS CALL IN ONE MONTH IF YOUR SWALLOWING IS NOT IMPROVED.   FOLLOW UP IN 4 MOS with DR. Shirleymae Hauth.   UPPER ENDOSCOPY AFTER CARE Read the instructions outlined below and refer to this sheet in the next week. These discharge instructions provide you with general information on caring for yourself after you leave the hospital. While your treatment has been planned according to the most current medical practices available, unavoidable complications occasionally occur. If you have any problems or questions after discharge, call DR. Desere Gwin, [phone].  ACTIVITY  You may resume your regular activity, but move at a slower pace for the next 24 hours.   Take frequent rest periods for the next 24 hours.   Walking will help get rid of the air and reduce the bloated feeling in your belly (abdomen).   No driving for 24 hours (because of the medicine (anesthesia) used during the test).   You may shower.   Do not sign any important legal documents or operate any machinery for 24 hours (because of the anesthesia used during the test).    NUTRITION  Drink plenty of fluids.   You may resume your normal diet as instructed by your doctor.   Begin with a light meal and  progress to your normal diet. Heavy or fried foods are harder to digest and may make you feel sick to your stomach (nauseated).   Avoid alcoholic beverages for 24 hours or as instructed.    MEDICATIONS  You may resume your normal medications.   WHAT YOU CAN EXPECT TODAY  Some feelings of bloating in the abdomen.   Passage of more gas than usual.    IF YOU HAD A BIOPSY TAKEN DURING THE UPPER ENDOSCOPY:  Eat a soft diet IF YOU HAVE NAUSEA, BLOATING, ABDOMINAL PAIN, OR VOMITING.    FINDING OUT THE RESULTS OF YOUR TEST Not all test results are available during your visit. DR. Oneida Alar WILL CALL YOU WITHIN 14 DAYS OF YOUR PROCEDUE WITH YOUR RESULTS. Do not assume everything is normal if you have not heard from DR. Ulysess Witz, CALL HER OFFICE AT [phone].  SEEK IMMEDIATE MEDICAL ATTENTION AND CALL THE OFFICE: [phone] IF:  You have more than a spotting of blood in your stool.   Your belly is swollen (abdominal distention).   You are nauseated or vomiting.   You have a temperature over 101F.   You have abdominal pain or discomfort that is severe or gets worse throughout the day.  Gastritis  Gastritis is an inflammation (the body's way of reacting to injury and/or infection) of the stomach. It is often caused by viral or bacterial (germ) infections. It can also be caused  BY ASPIRIN, BC/GOODY POWDER'S, (IBUPROFEN) MOTRIN, OR ALEVE (NAPROXEN), chemicals (including alcohol), SPICY FOODS, and medications. This illness may be associated with generalized malaise (feeling tired, not well), UPPER ABDOMINAL STOMACH cramps, and fever. One common bacterial cause of gastritis is an organism known as H. Pylori. This can be treated with antibiotics.     SOFT MECHANICAL DIET This SOFT MECHANICAL DIET is restricted to:  Foods that are moist, soft-textured, and easy to chew and swallow.   Meats that are ground or are minced no larger than one-quarter inch pieces. Meats are moist with gravy  or sauce added.   Foods that do not include bread or bread-like textures except soft pancakes, well-moistened with syrup or sauce.   Textures with some chewing ability required.   Casseroles without rice.   Cooked vegetables that are less than half an inch in size and easily mashed with a fork. No cooked corn, peas, broccoli, cauliflower, cabbage, Brussels sprouts, asparagus, or other fibrous, non-tender or rubbery cooked vegetables.   Canned fruit except for pineapple. Fruit must be cut into pieces no larger than half an inch in size.   Foods that do not include nuts, seeds, coconut, or sticky textures.   FOOD TEXTURES FOR SOFT MECHANICAL DIET (includes all foods on Dysphagia Diet Level 1 - Pureed, in addition to the foods listed below)  FOOD GROUP: Breads. RECOMMENDED: Soft pancakes, well-moistened with syrup or sauce.  AVOID: All others.  FOOD GROUP: Cereals.  RECOMMENDED: Cooked cereals with little texture, including oatmeal. Unprocessed wheat bran stirred into cereals for bulk. Note: If thin liquids are restricted, it is important that all of the liquid is absorbed into the cereal.  AVOID: All dry cereals and any cooked cereals that may contain flax seeds or other seeds or nuts. Whole-grain, dry, or coarse cereals. Cereals with nuts, seeds, dried fruit, and/or coconut.  FOOD GROUP: Desserts. RECOMMENDED: Pudding, custard. Soft fruit pies with bottom crust only. Canned fruit (excluding pineapple). Soft, moist cakes with icing.Frozen malts, milk shakes, frozen yogurt, eggnog, nutritional supplements, ice cream, sherbet, regular or sugar-free gelatin, or any foods that become thin liquid at either room (70 F) or body temperature (98 F).  AVOID: Dry, coarse cakes and cookies. Anything with nuts, seeds, coconut, pineapple, or dried fruit. Breakfast yogurt with nuts. Rice or bread pudding.  FOOD GROUP: Fats. RECOMMENDED: Butter, margarine, cream for cereal (depending on liquid  consistency recommendations), gravy, cream sauces, sour cream, sour cream dips with soft additives, mayonnaise, salad dressings, cream cheese, cream cheese spreads with soft additives, whipped toppings.  AVOID: All fats with coarse or chunky additives.  FOOD GROUP: Fruits. RECOMMENDED: Soft drained, canned, or cooked fruits without seeds or skin. Fresh soft and ripe banana. Fruit juices with a small amount of pulp. If thin liquids are restricted, fruit juices should be thickened to appropriate consistency.  AVOID: Fresh or frozen fruits. Cooked fruit with skin or seeds. Dried fruits. Fresh, canned, or cooked pineapple.  FOOD GROUP: Meats and Meat Substitutes. (Meat pieces should not exceed 1/4 of an inch cube and should be tender.) RECOMMENDED: Moistened ground or cooked meat, poultry, or fish. Moist ground or tender meat may be served with gravy or sauce. Casseroles without rice. Moist macaroni and cheese, well-cooked pasta with meat sauce, tuna noodle casserole, soft, moist lasagna. Moist meatballs, meatloaf, or fish loaf. Protein salads, such as tuna or egg without large chunks, celery, or onion. Cottage cheese, smooth quiche without large chunks. Poached, scrambled, or soft-cooked eggs (egg yolks  should not be runny but should be moist and able to be mashed with butter, margarine, or other moisture added to them). (Cook eggs to 160 F or use pasteurized eggs for safety.) Souffls may have small, soft chunks. Tofu. Well-cooked, slightly mashed, moist legumes, such as baked beans. All meats or protein substitutes should be served with sauces or moistened to help maintain cohesiveness in the oral cavity.  AVOID: Dry meats, tough meats (such as bacon, sausage, hot dogs, bratwurst). Dry casseroles or casseroles with rice or large chunks. Peanut butter. Cheese slices and cubes. Hard-cooked or crisp fried eggs. Sandwiches.Pizza.  FOOD GROUP: Potatoes and Starches. RECOMMENDED: Well-cooked,  moistened, boiled, baked, or mashed potatoes. Well-cooked shredded hash brown potatoes that are not crisp. (All potatoes need to be moist and in sauces.)Well-cooked noodles in sauce. Spaetzel or soft dumplings that have been moistened with butter or gravy.  AVOID: Potato skins and chips. Fried or French-fried potatoes. Rice.  FOOD GROUP: Soups. RECOMMENDED: Soups with easy-to-chew or easy-to-swallow meats or vegetables: Particle sizes in soups should be less than 1/2 inch. Soups will need to be thickened to appropriate consistency if soup is thinner than prescribed liquid consistency.  AVOID: Soups with large chunks of meat and vegetables. Soups with rice, corn, peas.  FOOD GROUP: Vegetables. RECOMMENDED: All soft, well-cooked vegetables. Vegetables should be less than a half inch. Should be easily mashed with a fork.  AVOID: Cooked corn and peas. Broccoli, cabbage, Brussels sprouts, asparagus, or other fibrous, non-tender or rubbery cooked vegetables.  FOOD GROUP: Miscellaneous. RECOMMENDED: Jams and preserves without seeds, jelly. Sauces, salsas, etc., that may have small tender chunks less than 1/2 inch. Soft, smooth chocolate bars that are easily chewed.  AVOID: Seeds, nuts, coconut, or sticky foods. Chewy candies such as caramels or licorice.

## 2018-07-11 NOTE — H&P (Addendum)
Primary Care Physician:  Karsten Ro, DO Primary Gastroenterologist:  Dr. Oneida Alar  Pre-Procedure History & Physical: HPI:  [name] is a 85 y.o. male here for DYSPHAGIA.  Past Medical History:  Diagnosis Date  . AAA (abdominal aortic aneurysm) ([place])   . Arthritis   . Blood clot in vein    at time of AAA surgery, requiring toe amputation  . Cancer ([place])    skin lesions removed  . Chronic kidney disease    Renal insufficiency Dr. Ray Church  . Coronary artery disease   . GERD (gastroesophageal reflux disease)   . Hemorrhoid   . Hypertension    Dr. Rory Percy, [address]  . Pneumonia    hx of pneumonia  . Thyroid disease     Past Surgical History:  Procedure Laterality Date  . ABDOMINAL AORTIC ANEURYSM REPAIR  12/05/2011   Procedure: ANEURYSM ABDOMINAL AORTIC REPAIR;  Surgeon: Mal Misty, MD;  Location: [facility] OR;  Service: Vascular;  Laterality: N/A;  Resection and Grafting of Abdominal Aortic Aneurysm using  18x19mm x 40cm Hemashielpd Gold Vascular Graft  . ABDOMINAL AORTIC ANEURYSM REPAIR    . AMPUTATION  02/20/2012   Procedure: AMPUTATION DIGIT;  Surgeon: Newt Minion, MD;  Location: [place];  Service: Orthopedics;  Laterality: Right;  Amputation Toes 1-4 Right Foot  . ARTERY EXPLORATION  12/05/2011   Procedure: ARTERY EXPLORATION;  Surgeon: Mal Misty, MD;  Location: [facility] OR;  Service: Vascular;  Laterality: Right;  Right Popliteal Artery Exploration  . EMBOLECTOMY  12/05/2011   Procedure: EMBOLECTOMY;  Surgeon: Mal Misty, MD;  Location: [place];  Service: Vascular;  Laterality: Right;  . EMBOLECTOMY  12/05/2011   Procedure: EMBOLECTOMY;  Surgeon: Mal Misty, MD;  Location: [place];  Service: Vascular;  Laterality: Right;  Thrombectomy of right anterior/posterior Tibial Arterys with vein patch angioplasty of tibial/peroneal trunk.  Marland Kitchen EYE SURGERY  ~ 1 year   cataract  . INTRAOPERATIVE ARTERIOGRAM  12/05/2011   Procedure: INTRA OPERATIVE ARTERIOGRAM;  Surgeon: Mal Misty, MD;  Location: [place];  Service: Vascular;  Laterality: Right;  . MOUTH SURGERY      Prior to Admission medications   Medication Sig Start Date End Date Taking? Authorizing Provider  amLODipine (NORVASC) 10 MG tablet Take 10 mg by mouth daily.   Yes [provider]  Cholecalciferol (VITAMIN D3) 1000 units CAPS Take 1,000 Units by mouth daily.   Yes [provider]  fish oil-omega-3 fatty acids 1000 MG capsule Take 2 g by mouth daily.   Yes [provider]  furosemide (LASIX) 20 MG tablet Take 10 mg by mouth every other day.    Yes [provider]  levothyroxine (SYNTHROID, LEVOTHROID) 150 MCG tablet Take 150 mcg by mouth daily before breakfast.    Yes [provider]  Multiple Vitamin (MULTIVITAMIN) tablet Take 1 tablet by mouth daily.   Yes [provider]  ranitidine (ZANTAC) 150 MG tablet Take 150 mg by mouth 2 (two) times daily.   Yes [provider]    Allergies as of 07/09/2018  . (No Known Allergies)    Family History  Problem Relation Age of Onset  . Coronary artery disease Mother   . Kidney disease Mother   . Heart attack Mother   . Stroke Father   . Hypertension Father   . Colon cancer Neg Hx   . Colon polyps Neg Hx     Social History   Socioeconomic  History  . Marital status: Married    Spouse name: Not on file  . Number of children: Not on file  . Years of education: Not on file  . Highest education level: Not on file  Occupational History  . Not on file  Social Needs  . Financial resource strain: Not on file  . Food insecurity:    Worry: Not on file    Inability: Not on file  . Transportation needs:    Medical: Not on file    Non-medical: Not on file  Tobacco Use  . Smoking status: Former Smoker    Packs/day: 1.00    Types: Cigarettes    Last attempt to quit: 07/22/1973    Years since quitting: 45.0  . Smokeless tobacco: Never Used  Substance and Sexual Activity  . Alcohol use: No     Alcohol/week: 0.0 standard drinks  . Drug use: No  . Sexual activity: Not on file  Lifestyle  . Physical activity:    Days per week: Not on file    Minutes per session: Not on file  . Stress: Not on file  Relationships  . Social connections:    Talks on phone: Not on file    Gets together: Not on file    Attends religious service: Not on file    Active member of club or organization: Not on file    Attends meetings of clubs or organizations: Not on file    Relationship status: Not on file  . Intimate partner violence:    Fear of current or ex partner: Not on file    Emotionally abused: Not on file    Physically abused: Not on file    Forced sexual activity: Not on file  Other Topics Concern  . Not on file  Social History Narrative  . Not on file    Review of Systems: See HPI, otherwise negative ROS   Physical Exam: BP 138/66   Pulse (!) 59   Temp (!) 97.4 F (36.3 C) (Oral)   Resp 17   Ht 6' (1.829 m)   Wt 88 kg   SpO2 99%   BMI 26.31 kg/m  General:   Alert,  pleasant and cooperative in NAD Head:  Normocephalic and atraumatic. Neck:  Supple; Lungs:  Clear throughout to auscultation.    Heart:  Regular rate and rhythm. Abdomen:  Soft, nontender and nondistended. Normal bowel sounds, without guarding, and without rebound.   Neurologic:  Alert and  oriented x4;  grossly normal neurologically.  Impression/Plan:     DYSPHAGIA  PLAN:  EGD/DIL TODAY. DISCUSSED PROCEDURE, BENEFITS, & RISKS: < 1% chance of medication reaction, bleeding, perforation, or ASPIRATION.

## 2018-07-11 NOTE — Op Note (Addendum)
[facility] Patient Name: [name] Procedure Date: 07/11/2018 9:24 AM MRN: [id number] Date of Birth: 1929/01/09 Attending MD: Barney Drain MD, MD CSN: [id number] Age: 85 Admit Type: Outpatient Procedure:                Upper GI endoscopy WITH BRUSH BIOPSY/COLD FORCEPS                            BIOPSY Indications:              Dysphagia Providers:                Barney Drain MD, MD, Janeece Riggers, RN, Raphael Gibney, Randa Spike, Technician Referring MD:             Rory Percy, MD Medicines:                Meperidine 25 mg IV, Midazolam 5 mg IV Complications:            No immediate complications. Estimated Blood Loss:     Estimated blood loss was minimal. Procedure:                Pre-Anesthesia Assessment:                           - Prior to the procedure, a History and Physical                            was performed, and patient medications and                            allergies were reviewed. The patient's tolerance of                            previous anesthesia was also reviewed. The risks                            and benefits of the procedure and the sedation                            options and risks were discussed with the patient.                            All questions were answered, and informed consent                            was obtained. Prior Anticoagulants: The patient has                            taken no previous anticoagulant or antiplatelet                            agents. ASA Grade Assessment: II - A patient with  mild systemic disease. After reviewing the risks                            and benefits, the patient was deemed in                            satisfactory condition to undergo the procedure.                            After obtaining informed consent, the endoscope was                            passed under direct vision. Throughout the   procedure, the patient's blood pressure, pulse, and                            oxygen saturations were monitored continuously. The                            GIF-H190 ([id number]) scope was introduced through the                            mouth, and advanced to the duodenal bulb. The upper                            GI endoscopy was performed with difficulty due to                            presence of food. The patient tolerated the                            procedure well. Scope In: 9:49:40 AM Scope Out: 10:12:43 AM Total Procedure Duration: 0 hours 23 minutes 3 seconds  Findings:      Food was found in the mid esophagus. Removal of food was accomplished.      Diffuse, white plaques were found in the entire esophagus. This was       biopsied with a BRUSH BIOPSY for KOH PREP. KOH POS FOR MODERATE YEAST ON       MAR 20.      Diffuse mild inflammation characterized by congestion (edema) and       erythema was found in the entire examined stomach. Biopsies were taken       with a cold forceps for Helicobacter pylori testing.      The examined duodenum was normal. Impression:               - Food in the mid esophagus. Removal was successful.                           - DYSPHAGIA DUE TO CANDIDA ESOPHAGITIS                           - Gastritis. Biopsied. Moderate Sedation:      Moderate (conscious) sedation was administered by the endoscopy nurse       and supervised by the endoscopist. The following parameters  were       monitored: oxygen saturation, heart rate, blood pressure, and response       to care. Total physician intraservice time was 36 minutes. Recommendation:           - Patient has a contact number available for                            emergencies. The signs and symptoms of potential                            delayed complications were discussed with the                            patient. Return to normal activities tomorrow.                            Written discharge  instructions were provided to the                            patient.                           - Soft diet.                           - Continue present medications. PT'S GFR IS 28-32.                            DISCUSSED WITH PHARMACY: AGREES WITH DIFLUCAN 200                            MG LOAD THEN 100 MG DAILY FOR 20 DAYS.                           - Await pathology results.                           - Return to my office in 4 months.                           - Repeat upper endoscopy in 2 months for                            retreatment IF DYSPHAGIA PERSISTS. Procedure Code(s):        --- Professional ---                           [phone], Esophagogastroduodenoscopy, flexible,                            transoral; with removal of foreign body(s)                           43239, Esophagogastroduodenoscopy, flexible,  transoral; with biopsy, single or multiple                           99153, Moderate sedation; each additional 15                            minutes intraservice time                           G0500, Moderate sedation services provided by the                            same physician or other qualified health care                            professional performing a gastrointestinal                            endoscopic service that sedation supports,                            requiring the presence of an independent trained                            observer to assist in the monitoring of the                            patient's level of consciousness and physiological                            status; initial 15 minutes of intra-service time;                            patient age 84 years or older (additional time may                            be reported with [phone], as appropriate) Diagnosis Code(s):        --- Professional ---                           [phone], Food in esophagus causing other injury,                            initial  encounter                           K22.9, Disease of esophagus, unspecified                           K29.70, Gastritis, unspecified, without bleeding                           R13.10, Dysphagia, unspecified CPT copyright 2018 American Medical Association. All rights reserved. The codes documented in this report are preliminary and upon coder review may  be revised to meet current compliance requirements. Sandi  Oneida Alar, MD Barney Drain MD, MD 07/11/2018 10:37:33 AM This report has been signed electronically. Number of Addenda: 0

## 2018-07-15 ENCOUNTER — Encounter (HOSPITAL_COMMUNITY): Payer: Self-pay | Admitting: Gastroenterology

## 2018-07-15 ENCOUNTER — Telehealth: Payer: Self-pay | Admitting: Gastroenterology

## 2018-07-15 NOTE — Telephone Encounter (Signed)
PT is aware and said he is doing better.

## 2018-07-15 NOTE — Telephone Encounter (Signed)
REVIEWED-NO ADDITIONAL RECOMMENDATIONS. 

## 2018-07-15 NOTE — Telephone Encounter (Signed)
  Please call pt. His stomach Bx ARE NEGATIVE FOR H PYLORI gastritis.    DRINK WATER TO KEEP YOUR URINE LIGHT YELLOW. FOLLOW A SOFT MECHANICAL DIET UNTIL YOUR NEXT OFFICE VISIT.  MEATS SHOULD BE GROUND ONLY.  DO NOT EAT CHUNKS OF ANYTHING. SEE INFO BELOW.  TO TREAT YEAST IN THE ESOPHAGUS, CONTINUE DIFLUCAN UNTIL IT IS ALL GONE.   PLEASE CALL IN ONE MONTH IF YOUR SWALLOWING IS NOT IMPROVED.   FOLLOW UP IN 4 MOS with DR. Edilberto Roosevelt.

## 2018-07-15 NOTE — Telephone Encounter (Signed)
ON RECALL  °

## 2018-08-05 DIAGNOSIS — D485 Neoplasm of uncertain behavior of skin: Secondary | ICD-10-CM | POA: Diagnosis not present

## 2018-08-05 DIAGNOSIS — L570 Actinic keratosis: Secondary | ICD-10-CM | POA: Diagnosis not present

## 2018-08-05 DIAGNOSIS — L821 Other seborrheic keratosis: Secondary | ICD-10-CM | POA: Diagnosis not present

## 2018-08-05 DIAGNOSIS — L0100 Impetigo, unspecified: Secondary | ICD-10-CM | POA: Diagnosis not present

## 2018-10-03 DIAGNOSIS — N189 Chronic kidney disease, unspecified: Secondary | ICD-10-CM | POA: Diagnosis not present

## 2018-10-03 DIAGNOSIS — E038 Other specified hypothyroidism: Secondary | ICD-10-CM | POA: Diagnosis not present

## 2018-10-03 DIAGNOSIS — Z0001 Encounter for general adult medical examination with abnormal findings: Secondary | ICD-10-CM | POA: Diagnosis not present

## 2018-10-03 DIAGNOSIS — Z6826 Body mass index (BMI) 26.0-26.9, adult: Secondary | ICD-10-CM | POA: Diagnosis not present

## 2018-10-03 DIAGNOSIS — D519 Vitamin B12 deficiency anemia, unspecified: Secondary | ICD-10-CM | POA: Diagnosis not present

## 2018-10-03 DIAGNOSIS — I10 Essential (primary) hypertension: Secondary | ICD-10-CM | POA: Diagnosis not present

## 2018-10-06 DIAGNOSIS — Z23 Encounter for immunization: Secondary | ICD-10-CM | POA: Diagnosis not present

## 2018-10-06 DIAGNOSIS — I10 Essential (primary) hypertension: Secondary | ICD-10-CM | POA: Diagnosis not present

## 2018-10-06 DIAGNOSIS — Z6827 Body mass index (BMI) 27.0-27.9, adult: Secondary | ICD-10-CM | POA: Diagnosis not present

## 2018-10-06 DIAGNOSIS — N189 Chronic kidney disease, unspecified: Secondary | ICD-10-CM | POA: Diagnosis not present

## 2018-10-06 DIAGNOSIS — E038 Other specified hypothyroidism: Secondary | ICD-10-CM | POA: Diagnosis not present

## 2018-10-06 DIAGNOSIS — H9193 Unspecified hearing loss, bilateral: Secondary | ICD-10-CM | POA: Diagnosis not present

## 2018-10-06 DIAGNOSIS — Z0001 Encounter for general adult medical examination with abnormal findings: Secondary | ICD-10-CM | POA: Diagnosis not present

## 2018-10-14 ENCOUNTER — Encounter: Payer: Self-pay | Admitting: Gastroenterology

## 2018-11-10 ENCOUNTER — Ambulatory Visit: Payer: Medicare HMO | Admitting: Gastroenterology

## 2018-11-23 ENCOUNTER — Inpatient Hospital Stay (HOSPITAL_COMMUNITY)
Admission: EM | Admit: 2018-11-23 | Discharge: 2018-11-26 | DRG: 481 | Disposition: A | Discharge status: Home Health | Payer: Medicare HMO | Attending: Family Medicine | Admitting: Family Medicine

## 2018-11-23 ENCOUNTER — Inpatient Hospital Stay (HOSPITAL_COMMUNITY): Payer: Medicare HMO | Admitting: Certified Registered Nurse Anesthetist

## 2018-11-23 ENCOUNTER — Encounter (HOSPITAL_COMMUNITY): Payer: Self-pay | Admitting: Emergency Medicine

## 2018-11-23 ENCOUNTER — Inpatient Hospital Stay (HOSPITAL_COMMUNITY): Payer: Medicare HMO

## 2018-11-23 ENCOUNTER — Other Ambulatory Visit: Payer: Self-pay

## 2018-11-23 ENCOUNTER — Encounter (HOSPITAL_COMMUNITY)
Admission: EM | Disposition: A | Discharge status: Home Health | Payer: Self-pay | Source: Home / Self Care | Attending: Internal Medicine

## 2018-11-23 ENCOUNTER — Emergency Department (HOSPITAL_COMMUNITY): Payer: Medicare HMO

## 2018-11-23 DIAGNOSIS — D62 Acute posthemorrhagic anemia: Secondary | ICD-10-CM | POA: Diagnosis not present

## 2018-11-23 DIAGNOSIS — N184 Chronic kidney disease, stage 4 (severe): Secondary | ICD-10-CM

## 2018-11-23 DIAGNOSIS — Y93K1 Activity, walking an animal: Secondary | ICD-10-CM | POA: Diagnosis not present

## 2018-11-23 DIAGNOSIS — Z8249 Family history of ischemic heart disease and other diseases of the circulatory system: Secondary | ICD-10-CM | POA: Diagnosis not present

## 2018-11-23 DIAGNOSIS — Z87891 Personal history of nicotine dependence: Secondary | ICD-10-CM

## 2018-11-23 DIAGNOSIS — D631 Anemia in chronic kidney disease: Secondary | ICD-10-CM | POA: Diagnosis present

## 2018-11-23 DIAGNOSIS — I10 Essential (primary) hypertension: Secondary | ICD-10-CM | POA: Diagnosis not present

## 2018-11-23 DIAGNOSIS — Z4789 Encounter for other orthopedic aftercare: Secondary | ICD-10-CM | POA: Diagnosis not present

## 2018-11-23 DIAGNOSIS — Z03818 Encounter for observation for suspected exposure to other biological agents ruled out: Secondary | ICD-10-CM | POA: Diagnosis not present

## 2018-11-23 DIAGNOSIS — J90 Pleural effusion, not elsewhere classified: Secondary | ICD-10-CM | POA: Diagnosis not present

## 2018-11-23 DIAGNOSIS — Z823 Family history of stroke: Secondary | ICD-10-CM | POA: Diagnosis not present

## 2018-11-23 DIAGNOSIS — Z8679 Personal history of other diseases of the circulatory system: Secondary | ICD-10-CM | POA: Diagnosis not present

## 2018-11-23 DIAGNOSIS — Z79890 Hormone replacement therapy (postmenopausal): Secondary | ICD-10-CM | POA: Diagnosis not present

## 2018-11-23 DIAGNOSIS — W010XXA Fall on same level from slipping, tripping and stumbling without subsequent striking against object, initial encounter: Secondary | ICD-10-CM | POA: Diagnosis present

## 2018-11-23 DIAGNOSIS — S72042A Displaced fracture of base of neck of left femur, initial encounter for closed fracture: Secondary | ICD-10-CM | POA: Diagnosis not present

## 2018-11-23 DIAGNOSIS — S72142A Displaced intertrochanteric fracture of left femur, initial encounter for closed fracture: Secondary | ICD-10-CM | POA: Diagnosis not present

## 2018-11-23 DIAGNOSIS — K219 Gastro-esophageal reflux disease without esophagitis: Secondary | ICD-10-CM | POA: Diagnosis not present

## 2018-11-23 DIAGNOSIS — W19XXXA Unspecified fall, initial encounter: Secondary | ICD-10-CM

## 2018-11-23 DIAGNOSIS — Z209 Contact with and (suspected) exposure to unspecified communicable disease: Secondary | ICD-10-CM | POA: Diagnosis not present

## 2018-11-23 DIAGNOSIS — Z419 Encounter for procedure for purposes other than remedying health state, unspecified: Secondary | ICD-10-CM

## 2018-11-23 DIAGNOSIS — Z20828 Contact with and (suspected) exposure to other viral communicable diseases: Secondary | ICD-10-CM | POA: Diagnosis present

## 2018-11-23 DIAGNOSIS — I129 Hypertensive chronic kidney disease with stage 1 through stage 4 chronic kidney disease, or unspecified chronic kidney disease: Secondary | ICD-10-CM | POA: Diagnosis present

## 2018-11-23 DIAGNOSIS — R0902 Hypoxemia: Secondary | ICD-10-CM | POA: Diagnosis not present

## 2018-11-23 DIAGNOSIS — N179 Acute kidney failure, unspecified: Secondary | ICD-10-CM | POA: Diagnosis not present

## 2018-11-23 DIAGNOSIS — Z841 Family history of disorders of kidney and ureter: Secondary | ICD-10-CM | POA: Diagnosis not present

## 2018-11-23 DIAGNOSIS — M1990 Unspecified osteoarthritis, unspecified site: Secondary | ICD-10-CM | POA: Diagnosis present

## 2018-11-23 DIAGNOSIS — D696 Thrombocytopenia, unspecified: Secondary | ICD-10-CM | POA: Diagnosis present

## 2018-11-23 DIAGNOSIS — E039 Hypothyroidism, unspecified: Secondary | ICD-10-CM | POA: Diagnosis present

## 2018-11-23 DIAGNOSIS — I739 Peripheral vascular disease, unspecified: Secondary | ICD-10-CM | POA: Diagnosis present

## 2018-11-23 DIAGNOSIS — J189 Pneumonia, unspecified organism: Secondary | ICD-10-CM | POA: Diagnosis not present

## 2018-11-23 DIAGNOSIS — N189 Chronic kidney disease, unspecified: Secondary | ICD-10-CM | POA: Diagnosis not present

## 2018-11-23 DIAGNOSIS — D649 Anemia, unspecified: Secondary | ICD-10-CM | POA: Diagnosis not present

## 2018-11-23 DIAGNOSIS — R52 Pain, unspecified: Secondary | ICD-10-CM | POA: Diagnosis not present

## 2018-11-23 DIAGNOSIS — I959 Hypotension, unspecified: Secondary | ICD-10-CM | POA: Diagnosis not present

## 2018-11-23 DIAGNOSIS — I4510 Unspecified right bundle-branch block: Secondary | ICD-10-CM | POA: Diagnosis not present

## 2018-11-23 DIAGNOSIS — I2510 Atherosclerotic heart disease of native coronary artery without angina pectoris: Secondary | ICD-10-CM | POA: Diagnosis present

## 2018-11-23 HISTORY — PX: INTRAMEDULLARY (IM) NAIL INTERTROCHANTERIC: SHX5875

## 2018-11-23 LAB — CBC
HCT: 27.2 % — ABNORMAL LOW (ref 39.0–52.0)
Hemoglobin: 9.1 g/dL — ABNORMAL LOW (ref 13.0–17.0)
MCH: 35.5 pg — ABNORMAL HIGH (ref 26.0–34.0)
MCHC: 33.5 g/dL (ref 30.0–36.0)
MCV: 106.3 fL — ABNORMAL HIGH (ref 80.0–100.0)
Platelets: 104 10*3/uL — ABNORMAL LOW (ref 150–400)
RBC: 2.56 MIL/uL — ABNORMAL LOW (ref 4.22–5.81)
RDW: 13 % (ref 11.5–15.5)
WBC: 7.2 10*3/uL (ref 4.0–10.5)
nRBC: 0 % (ref 0.0–0.2)

## 2018-11-23 LAB — BASIC METABOLIC PANEL
Anion gap: 9 (ref 5–15)
BUN: 29 mg/dL — ABNORMAL HIGH (ref 8–23)
CO2: 23 mmol/L (ref 22–32)
Calcium: 9.6 mg/dL (ref 8.9–10.3)
Chloride: 110 mmol/L (ref 98–111)
Creatinine, Ser: 2.04 mg/dL — ABNORMAL HIGH (ref 0.61–1.24)
GFR calc Af Amer: 33 mL/min — ABNORMAL LOW (ref >60)
GFR calc non Af Amer: 28 mL/min — ABNORMAL LOW (ref >60)
Glucose, Bld: 143 mg/dL — ABNORMAL HIGH (ref 70–99)
Potassium: 4.6 mmol/L (ref 3.5–5.1)
Sodium: 142 mmol/L (ref 135–145)

## 2018-11-23 LAB — CBC WITH DIFFERENTIAL/PLATELET
Abs Immature Granulocytes: 0.01 10*3/uL (ref 0.00–0.07)
Basophils Absolute: 0 10*3/uL (ref 0.0–0.1)
Basophils Relative: 0 %
Eosinophils Absolute: 0.3 10*3/uL (ref 0.0–0.5)
Eosinophils Relative: 5 %
HCT: 34.4 % — ABNORMAL LOW (ref 39.0–52.0)
Hemoglobin: 11.1 g/dL — ABNORMAL LOW (ref 13.0–17.0)
Immature Granulocytes: 0 %
Lymphocytes Relative: 26 %
Lymphs Abs: 1.6 10*3/uL (ref 0.7–4.0)
MCH: 34.2 pg — ABNORMAL HIGH (ref 26.0–34.0)
MCHC: 32.3 g/dL (ref 30.0–36.0)
MCV: 105.8 fL — ABNORMAL HIGH (ref 80.0–100.0)
Monocytes Absolute: 0.3 10*3/uL (ref 0.1–1.0)
Monocytes Relative: 4 %
Neutro Abs: 4.2 10*3/uL (ref 1.7–7.7)
Neutrophils Relative %: 65 %
Platelets: 100 10*3/uL — ABNORMAL LOW (ref 150–400)
RBC: 3.25 MIL/uL — ABNORMAL LOW (ref 4.22–5.81)
RDW: 13 % (ref 11.5–15.5)
WBC: 6.4 10*3/uL (ref 4.0–10.5)
nRBC: 0 % (ref 0.0–0.2)

## 2018-11-23 LAB — TYPE AND SCREEN
ABO/RH(D): O POS
Antibody Screen: NEGATIVE

## 2018-11-23 LAB — CREATININE, SERUM
Creatinine, Ser: 2.18 mg/dL — ABNORMAL HIGH (ref 0.61–1.24)
GFR calc Af Amer: 30 mL/min — ABNORMAL LOW (ref >60)
GFR calc non Af Amer: 26 mL/min — ABNORMAL LOW (ref >60)

## 2018-11-23 LAB — PROTIME-INR
INR: 1 (ref 0.8–1.2)
Prothrombin Time: 13.4 seconds (ref 11.4–15.2)

## 2018-11-23 LAB — SARS CORONAVIRUS 2 BY RT PCR (HOSPITAL ORDER, PERFORMED IN ~~LOC~~ HOSPITAL LAB): SARS Coronavirus 2: NEGATIVE

## 2018-11-23 SURGERY — FIXATION, FRACTURE, INTERTROCHANTERIC, WITH INTRAMEDULLARY ROD
Anesthesia: General | Site: Leg Upper | Laterality: Left

## 2018-11-23 MED ORDER — ONDANSETRON HCL 4 MG/2ML IJ SOLN
INTRAMUSCULAR | Status: DC | PRN
  Administered 2018-11-23: 4 mg via INTRAVENOUS

## 2018-11-23 MED ORDER — OMEGA-3 FATTY ACIDS 1000 MG PO CAPS: 2.0000 g | ORAL_CAPSULE | Freq: Every day | ORAL | Status: DC

## 2018-11-23 MED ORDER — SODIUM CHLORIDE 0.9 % IV SOLN
INTRAVENOUS | Status: DC
  Administered 2018-11-23: 125 mL/h via INTRAVENOUS

## 2018-11-23 MED ORDER — SENNA 8.6 MG PO TABS
1.0000 | ORAL_TABLET | Freq: Two times a day (BID) | ORAL | Status: DC
  Administered 2018-11-23 – 2018-11-26 (×6): 8.6 mg via ORAL
  Filled 2018-11-23 (×6): qty 1

## 2018-11-23 MED ORDER — ONDANSETRON HCL 4 MG PO TABS: 4.0000 mg | ORAL_TABLET | Freq: Four times a day (QID) | ORAL | Status: DC | PRN

## 2018-11-23 MED ORDER — HYDROCODONE-ACETAMINOPHEN 5-325 MG PO TABS: 1.0000 | ORAL_TABLET | Freq: Four times a day (QID) | ORAL | Status: DC | PRN

## 2018-11-23 MED ORDER — METOCLOPRAMIDE HCL 5 MG/ML IJ SOLN: 5.0000 mg | Freq: Three times a day (TID) | INTRAMUSCULAR | Status: DC | PRN

## 2018-11-23 MED ORDER — PROPOFOL 10 MG/ML IV BOLUS
INTRAVENOUS | Status: AC
  Filled 2018-11-23: qty 20

## 2018-11-23 MED ORDER — PROSIGHT PO TABS
1.0000 | ORAL_TABLET | Freq: Every day | ORAL | Status: DC
  Administered 2018-11-23 – 2018-11-26 (×4): 1 via ORAL
  Filled 2018-11-23 (×4): qty 1

## 2018-11-23 MED ORDER — LEVOTHYROXINE SODIUM 75 MCG PO TABS
150.0000 ug | ORAL_TABLET | Freq: Every day | ORAL | Status: DC
  Administered 2018-11-24 – 2018-11-26 (×3): 150 ug via ORAL
  Filled 2018-11-23 (×3): qty 2

## 2018-11-23 MED ORDER — DEXAMETHASONE SODIUM PHOSPHATE 10 MG/ML IJ SOLN
INTRAMUSCULAR | Status: DC | PRN
  Administered 2018-11-23: 10 mg via INTRAVENOUS

## 2018-11-23 MED ORDER — HYDROCODONE-ACETAMINOPHEN 5-325 MG PO TABS
ORAL_TABLET | ORAL | Status: AC
  Filled 2018-11-23: qty 2

## 2018-11-23 MED ORDER — LIDOCAINE 2% (20 MG/ML) 5 ML SYRINGE
INTRAMUSCULAR | Status: DC | PRN
  Administered 2018-11-23: 40 mg via INTRAVENOUS

## 2018-11-23 MED ORDER — PANTOPRAZOLE SODIUM 40 MG PO TBEC
40.0000 mg | DELAYED_RELEASE_TABLET | Freq: Every day | ORAL | Status: DC
  Administered 2018-11-23 – 2018-11-26 (×4): 40 mg via ORAL
  Filled 2018-11-23 (×4): qty 1

## 2018-11-23 MED ORDER — METHOCARBAMOL 500 MG PO TABS
ORAL_TABLET | ORAL | Status: AC
  Filled 2018-11-23: qty 1

## 2018-11-23 MED ORDER — PHENYLEPHRINE 40 MCG/ML (10ML) SYRINGE FOR IV PUSH (FOR BLOOD PRESSURE SUPPORT)
PREFILLED_SYRINGE | INTRAVENOUS | Status: DC | PRN
  Administered 2018-11-23 (×2): 80 ug via INTRAVENOUS

## 2018-11-23 MED ORDER — ROCURONIUM BROMIDE 10 MG/ML (PF) SYRINGE
PREFILLED_SYRINGE | INTRAVENOUS | Status: DC | PRN
  Administered 2018-11-23: 50 mg via INTRAVENOUS

## 2018-11-23 MED ORDER — SODIUM CHLORIDE 0.9 % IV SOLN
INTRAVENOUS | Status: DC | PRN
  Administered 2018-11-23: 25 ug/min via INTRAVENOUS

## 2018-11-23 MED ORDER — FENTANYL CITRATE (PF) 100 MCG/2ML IJ SOLN
50.0000 ug | INTRAMUSCULAR | Status: AC | PRN
  Administered 2018-11-23 (×3): 50 ug via INTRAVENOUS
  Administered 2018-11-23 (×2): 75 ug via INTRAVENOUS
  Filled 2018-11-23: qty 2

## 2018-11-23 MED ORDER — POLYETHYLENE GLYCOL 3350 17 G PO PACK: 17.0000 g | PACK | Freq: Every day | ORAL | Status: DC | PRN

## 2018-11-23 MED ORDER — FENTANYL CITRATE (PF) 250 MCG/5ML IJ SOLN
INTRAMUSCULAR | Status: AC
  Filled 2018-11-23: qty 5

## 2018-11-23 MED ORDER — LIDOCAINE 2% (20 MG/ML) 5 ML SYRINGE
INTRAMUSCULAR | Status: AC
  Filled 2018-11-23: qty 5

## 2018-11-23 MED ORDER — CEFAZOLIN SODIUM-DEXTROSE 2-4 GM/100ML-% IV SOLN
INTRAVENOUS | Status: AC
  Filled 2018-11-23: qty 100

## 2018-11-23 MED ORDER — CEFAZOLIN SODIUM-DEXTROSE 2-3 GM-%(50ML) IV SOLR
INTRAVENOUS | Status: DC | PRN
  Administered 2018-11-23: 2 g via INTRAVENOUS

## 2018-11-23 MED ORDER — MORPHINE SULFATE (PF) 2 MG/ML IV SOLN: 0.5000 mg | INTRAVENOUS | Status: DC | PRN

## 2018-11-23 MED ORDER — ONDANSETRON HCL 4 MG/2ML IJ SOLN
4.0000 mg | Freq: Once | INTRAMUSCULAR | Status: AC
  Administered 2018-11-23: 4 mg via INTRAVENOUS
  Filled 2018-11-23: qty 2

## 2018-11-23 MED ORDER — EPHEDRINE SULFATE 50 MG/ML IJ SOLN
INTRAMUSCULAR | Status: DC | PRN
  Administered 2018-11-23 (×3): 10 mg via INTRAVENOUS

## 2018-11-23 MED ORDER — AMLODIPINE BESYLATE 10 MG PO TABS
10.0000 mg | ORAL_TABLET | Freq: Every day | ORAL | Status: DC
  Administered 2018-11-23 – 2018-11-26 (×4): 10 mg via ORAL
  Filled 2018-11-23 (×4): qty 1

## 2018-11-23 MED ORDER — FENTANYL CITRATE (PF) 100 MCG/2ML IJ SOLN: 25.0000 ug | INTRAMUSCULAR | Status: DC | PRN

## 2018-11-23 MED ORDER — ACETAMINOPHEN 10 MG/ML IV SOLN
INTRAVENOUS | Status: AC
  Filled 2018-11-23: qty 100

## 2018-11-23 MED ORDER — ENOXAPARIN SODIUM 30 MG/0.3ML ~~LOC~~ SOLN
30.0000 mg | SUBCUTANEOUS | Status: DC
  Filled 2018-11-23 (×2): qty 0.3

## 2018-11-23 MED ORDER — FUROSEMIDE 20 MG PO TABS
10.0000 mg | ORAL_TABLET | ORAL | Status: DC
  Administered 2018-11-26: 10 mg via ORAL
  Filled 2018-11-23 (×2): qty 1

## 2018-11-23 MED ORDER — HYDROCODONE-ACETAMINOPHEN 5-325 MG PO TABS
1.0000 | ORAL_TABLET | ORAL | Status: DC | PRN
  Administered 2018-11-23 – 2018-11-25 (×3): 2 via ORAL
  Administered 2018-11-26: 1 via ORAL
  Filled 2018-11-23: qty 2
  Filled 2018-11-23: qty 1
  Filled 2018-11-23: qty 2

## 2018-11-23 MED ORDER — ADULT MULTIVITAMIN W/MINERALS CH
1.0000 | ORAL_TABLET | Freq: Every day | ORAL | Status: DC
  Administered 2018-11-23 – 2018-11-26 (×4): 1 via ORAL
  Filled 2018-11-23 (×4): qty 1

## 2018-11-23 MED ORDER — DEXAMETHASONE SODIUM PHOSPHATE 10 MG/ML IJ SOLN
INTRAMUSCULAR | Status: AC
  Filled 2018-11-23: qty 1

## 2018-11-23 MED ORDER — METHOCARBAMOL 500 MG PO TABS
500.0000 mg | ORAL_TABLET | Freq: Four times a day (QID) | ORAL | Status: DC | PRN
  Administered 2018-11-23 – 2018-11-26 (×4): 500 mg via ORAL
  Filled 2018-11-23 (×3): qty 1

## 2018-11-23 MED ORDER — ENOXAPARIN SODIUM 40 MG/0.4ML ~~LOC~~ SOLN: 40.0000 mg | SUBCUTANEOUS | Status: DC

## 2018-11-23 MED ORDER — HEPARIN SODIUM (PORCINE) 5000 UNIT/ML IJ SOLN: 5000.0000 [IU] | Freq: Three times a day (TID) | INTRAMUSCULAR | Status: DC

## 2018-11-23 MED ORDER — SODIUM CHLORIDE 0.9 % IV SOLN
INTRAVENOUS | Status: DC | PRN
  Administered 2018-11-23 (×2): via INTRAVENOUS

## 2018-11-23 MED ORDER — ACETAMINOPHEN 500 MG PO TABS
500.0000 mg | ORAL_TABLET | Freq: Four times a day (QID) | ORAL | Status: AC
  Administered 2018-11-23 – 2018-11-24 (×4): 500 mg via ORAL
  Filled 2018-11-23 (×4): qty 1

## 2018-11-23 MED ORDER — METOCLOPRAMIDE HCL 5 MG PO TABS: 5.0000 mg | ORAL_TABLET | Freq: Three times a day (TID) | ORAL | Status: DC | PRN

## 2018-11-23 MED ORDER — DOCUSATE SODIUM 100 MG PO CAPS
100.0000 mg | ORAL_CAPSULE | Freq: Two times a day (BID) | ORAL | Status: DC
  Administered 2018-11-23 – 2018-11-26 (×6): 100 mg via ORAL
  Filled 2018-11-23 (×6): qty 1

## 2018-11-23 MED ORDER — ROCURONIUM BROMIDE 10 MG/ML (PF) SYRINGE
PREFILLED_SYRINGE | INTRAVENOUS | Status: AC
  Filled 2018-11-23: qty 10

## 2018-11-23 MED ORDER — MORPHINE SULFATE (PF) 4 MG/ML IV SOLN
4.0000 mg | Freq: Once | INTRAVENOUS | Status: AC
  Administered 2018-11-23: 4 mg via INTRAVENOUS
  Filled 2018-11-23: qty 1

## 2018-11-23 MED ORDER — SUGAMMADEX SODIUM 200 MG/2ML IV SOLN
INTRAVENOUS | Status: DC | PRN
  Administered 2018-11-23: 200 mg via INTRAVENOUS

## 2018-11-23 MED ORDER — METHOCARBAMOL 1000 MG/10ML IJ SOLN
500.0000 mg | Freq: Four times a day (QID) | INTRAVENOUS | Status: DC | PRN
  Filled 2018-11-23: qty 5

## 2018-11-23 MED ORDER — MAGNESIUM CITRATE PO SOLN: 1.0000 | Freq: Once | ORAL | Status: DC | PRN

## 2018-11-23 MED ORDER — PROPOFOL 10 MG/ML IV BOLUS
INTRAVENOUS | Status: DC | PRN
  Administered 2018-11-23: 80 mg via INTRAVENOUS

## 2018-11-23 MED ORDER — BISACODYL 10 MG RE SUPP: 10.0000 mg | Freq: Every day | RECTAL | Status: DC | PRN

## 2018-11-23 MED ORDER — 0.9 % SODIUM CHLORIDE (POUR BTL) OPTIME
TOPICAL | Status: DC | PRN
  Administered 2018-11-23: 1000 mL

## 2018-11-23 MED ORDER — LACTATED RINGERS IV SOLN
INTRAVENOUS | Status: DC
  Administered 2018-11-23: 13:00:00 via INTRAVENOUS

## 2018-11-23 MED ORDER — SODIUM CHLORIDE 0.9 % IV SOLN
INTRAVENOUS | Status: DC
  Administered 2018-11-23 – 2018-11-24 (×2): via INTRAVENOUS

## 2018-11-23 MED ORDER — ONDANSETRON HCL 4 MG/2ML IJ SOLN
INTRAMUSCULAR | Status: AC
  Filled 2018-11-23: qty 2

## 2018-11-23 MED ORDER — ONDANSETRON HCL 4 MG/2ML IJ SOLN: 4.0000 mg | Freq: Four times a day (QID) | INTRAMUSCULAR | Status: DC | PRN

## 2018-11-23 MED ORDER — OMEGA-3-ACID ETHYL ESTERS 1 G PO CAPS
2.0000 g | ORAL_CAPSULE | Freq: Every day | ORAL | Status: DC
  Administered 2018-11-23 – 2018-11-26 (×4): 2 g via ORAL
  Filled 2018-11-23 (×4): qty 2

## 2018-11-23 MED ORDER — ACETAMINOPHEN 10 MG/ML IV SOLN
INTRAVENOUS | Status: DC | PRN
  Administered 2018-11-23: 1000 mg via INTRAVENOUS

## 2018-11-23 SURGICAL SUPPLY — 38 items
BIT DRILL CANN LG 4.3MM (BIT) ×1 IMPLANT
BLADE SURG 15 STRL LF DISP TIS (BLADE) ×1 IMPLANT
BLADE SURG 15 STRL SS (BLADE) ×2
CANISTER SUCT 3000ML PPV (MISCELLANEOUS) ×3 IMPLANT
CHLORAPREP W/TINT 26 (MISCELLANEOUS) ×3 IMPLANT
COVER MAYO STAND STRL (DRAPES) ×3 IMPLANT
COVER PERINEAL POST (MISCELLANEOUS) ×3 IMPLANT
COVER SURGICAL LIGHT HANDLE (MISCELLANEOUS) ×3 IMPLANT
COVER WAND RF STERILE (DRAPES) ×3 IMPLANT
DRAPE STERI IOBAN 125X83 (DRAPES) ×3 IMPLANT
DRAPE U-SHAPE 47X51 STRL (DRAPES) ×3 IMPLANT
DRILL BIT CANN LG 4.3MM (BIT) ×3
DRSG MEPILEX BORDER 4X4 (GAUZE/BANDAGES/DRESSINGS) ×3 IMPLANT
DRSG MEPILEX BORDER 4X8 (GAUZE/BANDAGES/DRESSINGS) ×3 IMPLANT
ELECT REM PT RETURN 9FT ADLT (ELECTROSURGICAL) ×3
ELECTRODE REM PT RTRN 9FT ADLT (ELECTROSURGICAL) ×1 IMPLANT
GLOVE BIO SURGEON STRL SZ7 (GLOVE) ×3 IMPLANT
GLOVE BIO SURGEON STRL SZ8 (GLOVE) ×3 IMPLANT
GLOVE BIOGEL PI IND STRL 8 (GLOVE) ×1 IMPLANT
GLOVE BIOGEL PI INDICATOR 8 (GLOVE) ×2
GOWN STRL REUS W/ TWL LRG LVL3 (GOWN DISPOSABLE) ×1 IMPLANT
GOWN STRL REUS W/ TWL XL LVL3 (GOWN DISPOSABLE) ×2 IMPLANT
GOWN STRL REUS W/TWL LRG LVL3 (GOWN DISPOSABLE) ×2
GOWN STRL REUS W/TWL XL LVL3 (GOWN DISPOSABLE) ×4
GUIDEPIN 3.2X17.5 THRD DISP (PIN) ×3 IMPLANT
HFN LAG SCREW 10.5MM X 115MM (Orthopedic Implant) ×3 IMPLANT
KIT BASIN OR (CUSTOM PROCEDURE TRAY) ×3 IMPLANT
KIT TURNOVER KIT B (KITS) ×3 IMPLANT
NAIL HIP FRACT 130D 11X180 (Screw) ×3 IMPLANT
NS IRRIG 1000ML POUR BTL (IV SOLUTION) ×3 IMPLANT
PACK GENERAL/GYN (CUSTOM PROCEDURE TRAY) ×3 IMPLANT
PAD ARMBOARD 7.5X6 YLW CONV (MISCELLANEOUS) ×6 IMPLANT
SCREW BONE CORTICAL 5.0X36 (Screw) ×3 IMPLANT
SPONGE LAP 18X18 RF (DISPOSABLE) ×3 IMPLANT
STAPLER VISISTAT 35W (STAPLE) ×3 IMPLANT
SUT MNCRL AB 3-0 PS2 18 (SUTURE) ×6 IMPLANT
SUT VIC AB 0 CT1 27 (SUTURE) ×4
SUT VIC AB 0 CT1 27XBRD ANBCTR (SUTURE) ×2 IMPLANT

## 2018-11-23 NOTE — Anesthesia Procedure Notes (Signed)
Procedure Name: Intubation Date/Time: 11/23/2018 1:39 PM Performed by: Clearnce Sorrel, CRNA Pre-anesthesia Checklist: Patient identified, Emergency Drugs available, Suction available, Patient being monitored and Timeout performed Patient Re-evaluated:Patient Re-evaluated prior to induction Oxygen Delivery Method: Circle system utilized Preoxygenation: Pre-oxygenation with 100% oxygen Induction Type: IV induction Ventilation: Mask ventilation without difficulty and Oral airway inserted - appropriate to patient size Laryngoscope Size: Mac and 4 Grade View: Grade I Tube type: Oral Tube size: 7.5 mm Number of attempts: 1 Airway Equipment and Method: Stylet Placement Confirmation: ETT inserted through vocal cords under direct vision,  positive ETCO2 and breath sounds checked- equal and bilateral Secured at: 23 cm Tube secured with: Tape Dental Injury: Teeth and Oropharynx as per pre-operative assessment

## 2018-11-23 NOTE — Transfer of Care (Signed)
Immediate Anesthesia Transfer of Care Note  Patient: [name]  Procedure(s) Performed: INTRAMEDULLARY (IM) NAIL INTERTROCHANTRIC (Left Leg Upper)  Patient Location: PACU  Anesthesia Type:General  Level of Consciousness: awake, alert  and oriented  Airway & Oxygen Therapy: Patient Spontanous Breathing  Post-op Assessment: Report given to RN and Post -op Vital signs reviewed and stable  Post vital signs: Reviewed and stable  Last Vitals:  Vitals Value Taken Time  BP 135/55 11/23/18 1508  Temp    Pulse 57 11/23/18 1515  Resp 14 11/23/18 1515  SpO2 98 % 11/23/18 1515  Vitals shown include unvalidated device data.  Last Pain:  Vitals:   11/23/18 1252  TempSrc:   PainSc: 1       Patients Stated Pain Goal: 2 (04/13/40 1464)  Complications: No apparent anesthesia complications

## 2018-11-23 NOTE — Progress Notes (Signed)
Gave report to Sunday Spillers, RN to provide direct patient care.

## 2018-11-23 NOTE — Anesthesia Postprocedure Evaluation (Signed)
Anesthesia Post Note  Patient: [name]  Procedure(s) Performed: INTRAMEDULLARY (IM) NAIL INTERTROCHANTRIC (Left Leg Upper)     Patient location during evaluation: PACU Anesthesia Type: General Level of consciousness: awake and alert Pain management: pain level controlled Vital Signs Assessment: post-procedure vital signs reviewed and stable Respiratory status: spontaneous breathing, nonlabored ventilation, respiratory function stable and patient connected to nasal cannula oxygen Cardiovascular status: blood pressure returned to baseline and stable Postop Assessment: no apparent nausea or vomiting Anesthetic complications: no    Last Vitals:  Vitals:   11/23/18 1538 11/23/18 1553  BP: 134/60 127/62  Pulse: 61 (!) 56  Resp: 17 15  Temp:  36.7 C  SpO2: 97% 98%    Last Pain:  Vitals:   11/23/18 1553  TempSrc:   PainSc: 0-No pain                 Effie Berkshire

## 2018-11-23 NOTE — ED Triage Notes (Signed)
Patient brought in via EMS. Alert and oriented. Airway patent. Patient c/o left hip pain after falling this morning. Per patient was tripped up by his lab dog. Shortening and rotation noted. Pedal pulse present.

## 2018-11-23 NOTE — Discharge Instructions (Addendum)
Wylene Simmer, MD [facility]  Please read the following information regarding your care after surgery.  Medications  You only need a prescription for the narcotic pain medicine (ex. oxycodone, Percocet, Norco).  All of the other medicines listed below are available over the counter. X Aleve 2 pills twice a day for the first 3 days after surgery. X acetominophen (Tylenol) 650 mg every 4-6 hours as you need for minor to moderate pain X Norco as prescribed for severe pain  Narcotic pain medicine (ex. oxycodone, Percocet, Vicodin) will cause constipation.  To prevent this problem, take the following medicines while you are taking any pain medicine. X docusate sodium (Colace) 100 mg twice a day X senna (Senokot) 2 tablets twice a day  x To help prevent blood clots, take 325mg  Aspirin twice daily x 2 weeks, then switch to a baby aspirin twice daily x 4 weeks.   You should also get up every hour while you are awake to move around.    Weight Bearing X Bear weight when you are able on your operated leg or foot. ? Bear weight only on your operated foot in the post-op shoe. ? Do not bear any weight on the operated leg or foot.  Cast / Splint / Dressing X Keep your splint, cast or dressing clean and dry.  Dont put anything (coat hanger, pencil, etc) down inside of it.  If it gets damp, use a hair dryer on the cool setting to dry it.  If it gets soaked, call the office to schedule an appointment for a cast change. ? Remove your dressing 3 days after surgery and cover the incisions with dry dressings.    After your dressing, cast or splint is removed; you may shower, but do not soak or scrub the wound.  Allow the water to run over it, and then gently pat it dry.  Swelling It is normal for you to have swelling where you had surgery.  To reduce swelling and pain, keep your toes above your nose for at least 3 days after surgery.  It may be necessary to keep your foot or leg elevated for several weeks.  If  it hurts, it should be elevated.  Follow Up Call my office at [phone] when you are discharged from the hospital or surgery center to schedule an appointment to be seen two weeks after surgery.  Call my office at [phone] if you develop a fever >101.5 F, nausea, vomiting, bleeding from the surgical site or severe pain.

## 2018-11-23 NOTE — Anesthesia Preprocedure Evaluation (Addendum)
Anesthesia Evaluation  Patient identified by MRN, date of birth, ID band Patient awake    Reviewed: Allergy & Precautions, NPO status , Patient's Chart, lab work & pertinent test results  Airway Mallampati: I  TM Distance: >3 FB Neck ROM: Full    Dental  (+) Partial Upper, Dental Advisory Given   Pulmonary former smoker,    Pulmonary exam normal breath sounds clear to auscultation       Cardiovascular hypertension, + CAD and + Peripheral Vascular Disease  Normal cardiovascular exam     Neuro/Psych negative neurological ROS  negative psych ROS   GI/Hepatic Neg liver ROS, GERD  ,  Endo/Other  negative endocrine ROS  Renal/GU Renal disease     Musculoskeletal  (+) Arthritis , Osteoarthritis,    Abdominal Normal abdominal exam  (+)   Peds  Hematology negative hematology ROS (+)   Anesthesia Other Findings   Reproductive/Obstetrics                            Anesthesia Physical Anesthesia Plan  ASA: III  Anesthesia Plan: General   Post-op Pain Management:    Induction: Intravenous, Rapid sequence and Cricoid pressure planned  PONV Risk Score and Plan: 3 and Ondansetron and Treatment may vary due to age or medical condition  Airway Management Planned: Oral ETT  Additional Equipment: None  Intra-op Plan:   Post-operative Plan: Extubation in OR  Informed Consent: I have reviewed the patients History and Physical, chart, labs and discussed the procedure including the risks, benefits and alternatives for the proposed anesthesia with the patient or authorized representative who has indicated his/her understanding and acceptance.     Dental advisory given  Plan Discussed with: CRNA  Anesthesia Plan Comments:        Anesthesia Quick Evaluation

## 2018-11-23 NOTE — ED Provider Notes (Signed)
[facility] EMERGENCY DEPARTMENT Provider Note   CSN: [id number] Arrival date & time: 11/23/18  0840    History   Chief Complaint Chief Complaint  Patient presents with   Hip Pain    HPI [name] is a 85 y.o. male.     Pt presents to the ED today with left hip pain.  Pt was walking his dog and was tripped accidentally by the dog and landed on the left hip.  No other injuries.  He is not on blood thinners.     Past Medical History:  Diagnosis Date   AAA (abdominal aortic aneurysm) (HCC)    Arthritis    Blood clot in vein    at time of AAA surgery, requiring toe amputation   Cancer ([place])    skin lesions removed   Chronic kidney disease    Renal insufficiency Dr. Ray Church   Coronary artery disease    GERD (gastroesophageal reflux disease)    Hemorrhoid    Hypertension    Dr. Rory Percy, [place]   Pneumonia    hx of pneumonia   Thyroid disease     Patient Active Problem List   Diagnosis Date Noted   Displaced intertrochanteric fracture of left femur, initial encounter for closed fracture ([place]) 11/23/2018   Dysphagia 07/09/2018   Aftercare following surgery of the circulatory system, NEC 01/22/2012   Embolism and thrombosis of arteries of lower extremity ([place]) 01/22/2012   Ischemic foot 12/13/2011   Elevated troponin 12/08/2011   Cardiac enzymes elevated 12/07/2011   Postoperative respiratory failure ([place]) 12/06/2011   AAA (abdominal aortic aneurysm) ([place]) 12/06/2011   Ischemic leg 12/06/2011   CKD 12/06/2011   GERD (gastroesophageal reflux disease) 12/06/2011   CAD (coronary artery disease) 12/06/2011   HTN (hypertension) 12/06/2011   Abdominal aneurysm without mention of rupture 08/14/2011    Past Surgical History:  Procedure Laterality Date   ABDOMINAL AORTIC ANEURYSM REPAIR  12/05/2011   Procedure: ANEURYSM ABDOMINAL AORTIC REPAIR;  Surgeon: Mal Misty, MD;  Location: [place];  Service: Vascular;  Laterality: N/A;   Resection and Grafting of Abdominal Aortic Aneurysm using  18x26mm x 40cm Hemashielpd Gold Vascular Graft   ABDOMINAL AORTIC ANEURYSM REPAIR     AMPUTATION  02/20/2012   Procedure: AMPUTATION DIGIT;  Surgeon: Newt Minion, MD;  Location: [place];  Service: Orthopedics;  Laterality: Right;  Amputation Toes 1-4 Right Foot   ARTERY EXPLORATION  12/05/2011   Procedure: ARTERY EXPLORATION;  Surgeon: Mal Misty, MD;  Location: [place];  Service: Vascular;  Laterality: Right;  Right Popliteal Artery Exploration   EMBOLECTOMY  12/05/2011   Procedure: EMBOLECTOMY;  Surgeon: Mal Misty, MD;  Location: [place];  Service: Vascular;  Laterality: Right;   EMBOLECTOMY  12/05/2011   Procedure: EMBOLECTOMY;  Surgeon: Mal Misty, MD;  Location: [place];  Service: Vascular;  Laterality: Right;  Thrombectomy of right anterior/posterior Tibial Arterys with vein patch angioplasty of tibial/peroneal trunk.   ESOPHAGEAL BRUSHING  07/11/2018   Procedure: ESOPHAGEAL BRUSHING;  Surgeon: Danie Binder, MD;  [facility];  Service: Endoscopy;;   ESOPHAGOGASTRODUODENOSCOPY N/A 07/11/2018   Procedure: ESOPHAGOGASTRODUODENOSCOPY (EGD);  Surgeon: Danie Binder, MD;  [facility];  Service: Endoscopy;  Laterality: N/A;  10:30am   EYE SURGERY  ~ 1 year   cataract   FOREIGN BODY REMOVAL  07/11/2018   Procedure: FOREIGN BODY REMOVAL;  Surgeon: Danie Binder, MD;  [facility];  Service: Endoscopy;;   INTRAOPERATIVE ARTERIOGRAM  12/05/2011   Procedure: INTRA OPERATIVE ARTERIOGRAM;  Surgeon: Mal Misty, MD;  Location: [place];  Service: Vascular;  Laterality: Right;   MOUTH SURGERY          Home Medications    Prior to Admission medications   Medication Sig Start Date End Date Taking? Authorizing Provider  amLODipine (NORVASC) 10 MG tablet Take 10 mg by mouth daily.   Yes [provider]  fish oil-omega-3 fatty acids 1000 MG capsule Take 2 g by mouth daily.   Yes  [provider]  furosemide (LASIX) 20 MG tablet Take 10 mg by mouth every Monday, Wednesday, and Friday.    Yes [provider]  levothyroxine (SYNTHROID, LEVOTHROID) 150 MCG tablet Take 150 mcg by mouth daily before breakfast.    Yes [provider]  Multiple Vitamin (MULTIVITAMIN) tablet Take 1 tablet by mouth daily.   Yes [provider]  multivitamin-lutein (OCUVITE-LUTEIN) CAPS capsule Take 1 capsule by mouth daily.   Yes [provider]  pantoprazole (PROTONIX) 40 MG tablet Take 1 tablet by mouth daily. 10/22/18  Yes [provider]    Family History Family History  Problem Relation Age of Onset   Coronary artery disease Mother    Kidney disease Mother    Heart attack Mother    Stroke Father    Hypertension Father    Colon cancer Neg Hx    Colon polyps Neg Hx     Social History Social History   Tobacco Use   Smoking status: Former Smoker    Packs/day: 1.00    Types: Cigarettes    Quit date: 07/22/1973    Years since quitting: 45.3   Smokeless tobacco: Never Used  Substance Use Topics   Alcohol use: No    Alcohol/week: 0.0 standard drinks   Drug use: No     Allergies   Patient has no known allergies.   Review of Systems Review of Systems  Musculoskeletal:       Left hip pain  All other systems reviewed and are negative.    Physical Exam Updated Vital Signs BP (!) 158/66    Pulse (!) 54    Temp 98.2 F (36.8 C) (Oral)    Resp 11    Ht 6' (1.829 m)    Wt 88.5 kg    SpO2 100%    BMI 26.45 kg/m   Physical Exam Vitals signs and nursing note reviewed.  Constitutional:      Appearance: Normal appearance.  HENT:     Head: Normocephalic and atraumatic.     Right Ear: External ear normal.     Left Ear: External ear normal.     Nose: Nose normal.     Mouth/Throat:     Mouth: Mucous membranes are moist.     Pharynx: Oropharynx is clear.  Eyes:     Extraocular Movements: Extraocular movements  intact.     Conjunctiva/sclera: Conjunctivae normal.     Pupils: Pupils are equal, round, and reactive to light.  Neck:     Musculoskeletal: Normal range of motion and neck supple.  Cardiovascular:     Rate and Rhythm: Normal rate and regular rhythm.     Pulses: Normal pulses.     Heart sounds: Normal heart sounds.  Pulmonary:     Effort: Pulmonary effort is normal.     Breath sounds: Normal breath sounds.  Abdominal:     General: Abdomen is flat. Bowel  sounds are normal.     Palpations: Abdomen is soft.  Musculoskeletal:     Left hip: He exhibits decreased range of motion, tenderness and deformity.  Skin:    General: Skin is warm.     Capillary Refill: Capillary refill takes less than 2 seconds.  Neurological:     General: No focal deficit present.     Mental Status: He is alert and oriented to person, place, and time.  Psychiatric:        Mood and Affect: Mood normal.        Behavior: Behavior normal.        Thought Content: Thought content normal.        Judgment: Judgment normal.      ED Treatments / Results  Labs (all labs ordered are listed, but only abnormal results are displayed) Labs Reviewed  BASIC METABOLIC PANEL - Abnormal; Notable for the following components:      Result Value   Glucose, Bld 143 (*)    BUN 29 (*)    Creatinine, Ser 2.04 (*)    GFR calc non Af Amer 28 (*)    GFR calc Af Amer 33 (*)    All other components within normal limits  CBC WITH DIFFERENTIAL/PLATELET - Abnormal; Notable for the following components:   RBC 3.25 (*)    Hemoglobin 11.1 (*)    HCT 34.4 (*)    MCV 105.8 (*)    MCH 34.2 (*)    Platelets 100 (*)    All other components within normal limits  SARS CORONAVIRUS 2 (HOSPITAL ORDER, [facility])  PROTIME-INR  TYPE AND SCREEN    EKG EKG Interpretation  Date/Time:  Sunday November 23 2018 09:06:51 EDT Ventricular Rate:  52 PR Interval:    QRS Duration: 162 QT Interval:  473 QTC  Calculation: 440 R Axis:   -76 Text Interpretation:  Sinus rhythm RBBB and LAFB Confirmed by Isla Pence [id number]) on 11/23/2018 9:12:19 AM   Radiology Dg Chest 1 View  Result Date: 11/23/2018 CLINICAL DATA:  Pt states he fell and tripped over his dog onset this morning and is c/o left leg pain. Pt left foot laid out to the side upon arrival for xray. Immobilization device used for AP view due to pt unable to rotate leg. Pt denies any on body medical injector devices EXAM: CHEST  1 VIEW COMPARISON:  07/03/2018 FINDINGS: Cardiac silhouette is normal in size. No mediastinal or hilar masses. No evidence of adenopathy. Lungs demonstrate prominent bronchovascular markings and areas of stable scarring. No evidence of pneumonia or pulmonary edema. No pleural effusion or pneumothorax. Skeletal structures are grossly intact. IMPRESSION: No acute cardiopulmonary disease. Electronically Signed   By: Lajean Manes M.D.   On: 11/23/2018 10:32   Dg Pelvis 1-2 Views  Result Date: 11/23/2018 CLINICAL DATA:  Pt states he fell and tripped over his dog onset this morning and is c/o left leg pain. Pt left foot laid out to the side upon arrival for xray. Immobilization device used for AP view due to pt unable to rotate leg. Pt denies any on body medical injector devices EXAM: PELVIS - 1-2 VIEW COMPARISON:  CT, 11/13/2011 FINDINGS: Comminuted intertrochanteric fracture of the proximal left femur. No other fractures.  No bone lesions. Hip joints, SI joints and symphysis pubis are normally aligned. Bones are demineralized. IMPRESSION: 1. Comminuted, intertrochanteric fracture of the proximal left femur. Refer to the left femur radiographs for further details. 2. No  other fractures.  No dislocation. Electronically Signed   By: Lajean Manes M.D.   On: 11/23/2018 10:35   Dg Femur Min 2 Views Left  Result Date: 11/23/2018 CLINICAL DATA:  Pt states he fell and tripped over his dog onset this morning and is c/o left leg pain. Pt  left foot laid out to the side upon arrival for xray. Immobilization device used for AP view due to pt unable to rotate leg. Pt denies any on body medical injector devices EXAM: LEFT FEMUR 2 VIEWS COMPARISON:  None. FINDINGS: Comminuted intertrochanteric fracture of the proximal left femur with separate fracture components across the lesser and greater trochanters. Primary fracture components are mildly displaced, the distal component displacing 1.3 cm laterally. No other fractures.  The hip and knee joints are normally aligned. No bone lesions. IMPRESSION: 1. Comminuted, mildly displaced, non angulated intertrochanteric fracture of the proximal left femur. No dislocation. Electronically Signed   By: Lajean Manes M.D.   On: 11/23/2018 10:34    Procedures Procedures (including critical care time)  Medications Ordered in ED Medications  0.9 %  sodium chloride infusion (125 mL/hr Intravenous New Bag/Given 11/23/18 0934)  fentaNYL (SUBLIMAZE) injection 50 mcg (50 mcg Intravenous Given 11/23/18 0859)  ondansetron (ZOFRAN) injection 4 mg (4 mg Intravenous Given 11/23/18 0859)  morphine 4 MG/ML injection 4 mg (4 mg Intravenous Given 11/23/18 0958)     Initial Impression / Assessment and Plan / ED Course  I have reviewed the triage vital signs and the nursing notes.  Pertinent labs & imaging results that were available during my care of the patient were reviewed by me and considered in my medical decision making (see chart for details).       Pt has an IT fx on xray.  He was d/w Dr. Doran Durand (ortho).  He'd like to operate on pt today at [facility].  We will keep him npo as he's had nothing yet today.  He was d/w Dr. Carles Collet (triad) who will admit.  Final Clinical Impressions(s) / ED Diagnoses   Final diagnoses:  Closed displaced intertrochanteric fracture of left femur, initial encounter [facility])  Fall, initial encounter    ED Discharge Orders    None       Isla Pence, MD 11/23/18 1045
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
## 2018-11-23 NOTE — Progress Notes (Signed)
Report received from [place], [place] from [facility] ED.

## 2018-11-23 NOTE — ED Notes (Signed)
Report given to Adriane at [facility] short stay. Pt is to go to [facility].

## 2018-11-23 NOTE — H&P (Signed)
History and Physical  [name] EBX:[id number] DOB: March 27, 1929 DOA: 11/23/2018   PCP: Karsten Ro, DO   Patient coming from: Home  Chief Complaint: left hip pain  HPI:  [name] is a 85 y.o. male with medical history of hypertension, CKD stage IV, skin cancer, GERD, AAA presenting after mechanical fall when he tripped over his dog while he was gardening.  The patient fell onto his left side and had pain and was not able to get up.  EMS was activated.  Prior to today's events, the patient had been in his usual state of health without any fevers, chills, chest discomfort, shortness breath, nausea, vomiting, diarrhea, abdominal pain.  He is able to perform all his activities of daily living without any exertional chest discomfort or shortness of breath.  He has able to climb up a flight of 15 steps without any difficulty. In the emergency department, the patient was afebrile hemodynamically stable saturating 100% room air.  BMP was unremarkable with a serum creatinine 2.04 which is at baseline.  CBC showed hemoglobin 11.1 which is his baseline.  WBC was 6.4 with platelets 100,000.  Orthopedics, Dr. Doran Durand was consulted, and recommended transfer to [facility] for operative management.  Assessment/Plan: Left intratrochanteric femur fracture -Dr. Doran Durand consulted, plans operative management 8/2 -remain npo -PT/OT after operation -pain control -pt is medically stable for surgery presently  Essential hypertension -Restart amlodipine  CKD stage IV -Baseline creatinine 1.9-2.1 -A.m. BMP  Hypothyroidism -Continue Synthroid  GERD -Continue PPI        Past Medical History:  Diagnosis Date  . AAA (abdominal aortic aneurysm) ([place])   . Arthritis   . Blood clot in vein    at time of AAA surgery, requiring toe amputation  . Cancer ([place])    skin lesions removed  . Chronic kidney disease    Renal insufficiency Dr. Ray Church  . Coronary artery disease   . GERD  (gastroesophageal reflux disease)   . Hemorrhoid   . Hypertension    Dr. Rory Percy, [place]  . Pneumonia    hx of pneumonia  . Thyroid disease    Past Surgical History:  Procedure Laterality Date  . ABDOMINAL AORTIC ANEURYSM REPAIR  12/05/2011   Procedure: ANEURYSM ABDOMINAL AORTIC REPAIR;  Surgeon: Mal Misty, MD;  Location: [facility] OR;  Service: Vascular;  Laterality: N/A;  Resection and Grafting of Abdominal Aortic Aneurysm using  18x71mm x 40cm Hemashielpd Gold Vascular Graft  . ABDOMINAL AORTIC ANEURYSM REPAIR    . AMPUTATION  02/20/2012   Procedure: AMPUTATION DIGIT;  Surgeon: Newt Minion, MD;  Location: [place];  Service: Orthopedics;  Laterality: Right;  Amputation Toes 1-4 Right Foot  . ARTERY EXPLORATION  12/05/2011   Procedure: ARTERY EXPLORATION;  Surgeon: Mal Misty, MD;  Location: [facility] OR;  Service: Vascular;  Laterality: Right;  Right Popliteal Artery Exploration  . EMBOLECTOMY  12/05/2011   Procedure: EMBOLECTOMY;  Surgeon: Mal Misty, MD;  Location: [place];  Service: Vascular;  Laterality: Right;  . EMBOLECTOMY  12/05/2011   Procedure: EMBOLECTOMY;  Surgeon: Mal Misty, MD;  Location: [place];  Service: Vascular;  Laterality: Right;  Thrombectomy of right anterior/posterior Tibial Arterys with vein patch angioplasty of tibial/peroneal trunk.  . ESOPHAGEAL BRUSHING  07/11/2018   Procedure: ESOPHAGEAL BRUSHING;  Surgeon: Danie Binder, MD;  [facility];  Service: Endoscopy;;  . ESOPHAGOGASTRODUODENOSCOPY N/A 07/11/2018   Procedure: ESOPHAGOGASTRODUODENOSCOPY (EGD);  Surgeon: Danie Binder, MD;  [facility];  Service: Endoscopy;  Laterality: N/A;  10:30am  . EYE SURGERY  ~ 1 year   cataract  . FOREIGN BODY REMOVAL  07/11/2018   Procedure: FOREIGN BODY REMOVAL;  Surgeon: Danie Binder, MD;  [facility];  Service: Endoscopy;;  . INTRAOPERATIVE ARTERIOGRAM  12/05/2011   Procedure: INTRA OPERATIVE ARTERIOGRAM;  Surgeon: Mal Misty,  MD;  Location: [place];  Service: Vascular;  Laterality: Right;  . MOUTH SURGERY     Social History:  reports that he quit smoking about 45 years ago. His smoking use included cigarettes. He smoked 1.00 pack per day. He has never used smokeless tobacco. He reports that he does not drink alcohol or use drugs.   Family History  Problem Relation Age of Onset  . Coronary artery disease Mother   . Kidney disease Mother   . Heart attack Mother   . Stroke Father   . Hypertension Father   . Colon cancer Neg Hx   . Colon polyps Neg Hx      No Known Allergies   Prior to Admission medications   Medication Sig Start Date End Date Taking? Authorizing Provider  amLODipine (NORVASC) 10 MG tablet Take 10 mg by mouth daily.   Yes [provider]  fish oil-omega-3 fatty acids 1000 MG capsule Take 2 g by mouth daily.   Yes [provider]  furosemide (LASIX) 20 MG tablet Take 10 mg by mouth every Monday, Wednesday, and Friday.    Yes [provider]  levothyroxine (SYNTHROID, LEVOTHROID) 150 MCG tablet Take 150 mcg by mouth daily before breakfast.    Yes [provider]  Multiple Vitamin (MULTIVITAMIN) tablet Take 1 tablet by mouth daily.   Yes [provider]  multivitamin-lutein (OCUVITE-LUTEIN) CAPS capsule Take 1 capsule by mouth daily.   Yes [provider]  pantoprazole (PROTONIX) 40 MG tablet Take 1 tablet by mouth daily. 10/22/18  Yes [provider]    Review of Systems:  Constitutional:  No weight loss, night sweats, Fevers, chills, fatigue.  Head&Eyes: No headache.  No vision loss.  No eye pain or scotoma ENT:  No Difficulty swallowing,Tooth/dental problems,Sore throat,  No ear ache, post nasal drip,  Cardio-vascular:  No chest pain, Orthopnea, PND, swelling in lower extremities,  dizziness, palpitations  GI:  No  abdominal pain, nausea, vomiting, diarrhea, loss of appetite, hematochezia, melena, heartburn, indigestion,  Resp:  No shortness of breath with exertion or at rest. No cough. No coughing up of blood .No wheezing.No chest wall deformity  Skin:  no rash or lesions.  GU:  no dysuria, change in color of urine, no urgency or frequency. No flank pain.  Musculoskeletal:  No joint pain or swelling. No decreased range of motion. No back pain.  Psych:  No change in mood or affect. No depression or anxiety. Neurologic: No headache, no dysesthesia, no focal weakness, no vision loss. No syncope  Physical Exam: Vitals:   11/23/18 0915 11/23/18 0930 11/23/18 0945 11/23/18 1000  BP: (!) 164/62 137/63 (!) 151/79 (!) 158/66  Pulse: (!) 54 62 (!) 54 (!) 54  Resp: 14 (!) 31 13 11   Temp:      TempSrc:      SpO2:      Weight:      Height:       General:  A&O x 3, NAD, nontoxic, pleasant/cooperative Head/Eye: No conjunctival hemorrhage, no icterus, [place]/AT, No nystagmus  ENT:  No icterus,  No thrush, good dentition, no pharyngeal exudate Neck:  No masses, no lymphadenpathy, no bruits CV:  RRR, no rub, no gallop, no S3 Lung:  CTAB, good air movement, no wheeze, no rhonchi Abdomen: soft/NT, +BS, nondistended, no peritoneal signs Ext: No cyanosis, No rashes, No petechiae, No lymphangitis, No edema Neuro: CNII-XII intact, strength 4/5 in bilateral upper and lower extremities, no dysmetria  Labs on Admission:  Basic Metabolic Panel: Recent Labs  Lab 11/23/18 0858  NA 142  K 4.6  CL 110  CO2 23  GLUCOSE 143*  BUN 29*  CREATININE 2.04*  CALCIUM 9.6   Liver Function Tests: No results for input(s): AST, ALT, ALKPHOS, BILITOT, PROT, ALBUMIN in the last 168 hours. No results for input(s): LIPASE, AMYLASE in the last 168 hours. No results for input(s): AMMONIA in the last 168 hours. CBC: Recent Labs  Lab 11/23/18 0858  WBC 6.4  NEUTROABS 4.2  HGB 11.1*  HCT 34.4*  MCV 105.8*  PLT 100*   Coagulation Profile: Recent Labs  Lab 11/23/18 0858  INR 1.0   Cardiac Enzymes: No results for input(s):  CKTOTAL, CKMB, CKMBINDEX, TROPONINI in the last 168 hours. BNP: Invalid input(s): POCBNP CBG: No results for input(s): GLUCAP in the last 168 hours. Urine analysis:    Component Value Date/Time   COLORURINE YELLOW 12/14/2011 [place] 12/14/2011 1323   LABSPEC 1.015 12/14/2011 1323   PHURINE 6.0 12/14/2011 1323   GLUCOSEU NEGATIVE 12/14/2011 1323   HGBUR NEGATIVE 12/14/2011 1323   BILIRUBINUR NEGATIVE 12/14/2011 1323   KETONESUR NEGATIVE 12/14/2011 1323   PROTEINUR 30 (A) 12/14/2011 1323   UROBILINOGEN 1.0 12/14/2011 1323   NITRITE NEGATIVE 12/14/2011 1323   LEUKOCYTESUR NEGATIVE 12/14/2011 1323   Sepsis Labs: @LABRCNTIP (procalcitonin:4,lacticidven:4) ) Recent Results (from the past 240 hour(s))  SARS Coronavirus 2 [facility] order, Performed in [facility] hospital lab) Nasopharyngeal Nasopharyngeal Swab     Status: None   Collection Time: 11/23/18  8:50 AM   Specimen: Nasopharyngeal Swab  Result Value Ref Range Status   SARS Coronavirus 2 NEGATIVE NEGATIVE Final    Comment: (NOTE) If result is NEGATIVE SARS-CoV-2 target nucleic acids are NOT DETECTED. The SARS-CoV-2 RNA is generally detectable in upper and lower  respiratory specimens during the acute phase of infection. The lowest  concentration of SARS-CoV-2 viral copies this assay can detect is 250  copies / mL. A negative result does not preclude SARS-CoV-2 infection  and should not be used as the sole basis for treatment or other  patient management decisions.  A negative result may occur with  improper specimen collection / handling, submission of specimen other  than nasopharyngeal swab, presence of viral mutation(s) within the  areas targeted by this assay, and inadequate number of viral copies  (<250 copies / mL). A negative result must be combined with clinical  observations, patient history, and epidemiological information. If result is POSITIVE SARS-CoV-2 target nucleic acids are DETECTED.  The SARS-CoV-2 RNA is generally detectable in upper and lower  respiratory specimens dur ing the acute phase of infection.  Positive  results are indicative of active infection with SARS-CoV-2.  Clinical  correlation with patient history and other diagnostic information is  necessary to determine patient infection status.  Positive results do  not rule out bacterial infection or co-infection with other viruses. If result is PRESUMPTIVE POSTIVE SARS-CoV-2 nucleic acids MAY BE PRESENT.   A presumptive positive result was obtained on the submitted specimen  and confirmed on repeat testing.  While 2019 novel coronavirus  (SARS-CoV-2) nucleic acids may be present in the submitted sample  additional confirmatory testing may be necessary for epidemiological  and / or clinical management purposes  to differentiate between  SARS-CoV-2 and other Sarbecovirus currently known to infect humans.  If clinically indicated additional testing with an alternate test  methodology [phone]) is advised. The SARS-CoV-2 RNA is generally  detectable in upper and lower respiratory sp ecimens during the acute  phase of infection. The expected result is Negative. Fact Sheet for Patients:  StrictlyIdeas.no Fact Sheet for Healthcare Providers: BankingDealers.co.za This test is not yet approved or cleared by the [place] FDA and has been authorized for detection and/or diagnosis of SARS-CoV-2 by FDA under an Emergency Use Authorization (EUA).  This EUA will remain in effect (meaning this test can be used) for the duration of the COVID-19 declaration under Section 564(b)(1) of the Act, 21 U.S.C. section 360bbb-3(b)(1), unless the authorization is terminated or revoked sooner. Performed at [facility], [address] [place], [place] [address]      Radiological Exams on Admission: Dg Chest 1 View  Result Date: 11/23/2018 CLINICAL DATA:  Pt states he fell and  tripped over his dog onset this morning and is c/o left leg pain. Pt left foot laid out to the side upon arrival for xray. Immobilization device used for AP view due to pt unable to rotate leg. Pt denies any on body medical injector devices EXAM: CHEST  1 VIEW COMPARISON:  07/03/2018 FINDINGS: Cardiac silhouette is normal in size. No mediastinal or hilar masses. No evidence of adenopathy. Lungs demonstrate prominent bronchovascular markings and areas of stable scarring. No evidence of pneumonia or pulmonary edema. No pleural effusion or pneumothorax. Skeletal structures are grossly intact. IMPRESSION: No acute cardiopulmonary disease. Electronically Signed   By: Lajean Manes M.D.   On: 11/23/2018 10:32   Dg Pelvis 1-2 Views  Result Date: 11/23/2018 CLINICAL DATA:  Pt states he fell and tripped over his dog onset this morning and is c/o left leg pain. Pt left foot laid out to the side upon arrival for xray. Immobilization device used for AP view due to pt unable to rotate leg. Pt denies any on body medical injector devices EXAM: PELVIS - 1-2 VIEW COMPARISON:  CT, 11/13/2011 FINDINGS: Comminuted intertrochanteric fracture of the proximal left femur. No other fractures.  No bone lesions. Hip joints, SI joints and symphysis pubis are normally aligned. Bones are demineralized. IMPRESSION: 1. Comminuted, intertrochanteric fracture of the proximal left femur. Refer to the left femur radiographs for further details. 2. No other fractures.  No dislocation. Electronically Signed   By: Lajean Manes M.D.   On: 11/23/2018 10:35   Dg Femur Min 2 Views Left  Result Date: 11/23/2018 CLINICAL DATA:  Pt states he fell and tripped over his dog onset this morning and is c/o left leg pain. Pt left foot laid out to the side upon arrival for xray. Immobilization device used for AP view due to pt unable to rotate leg. Pt denies any on body medical injector devices EXAM: LEFT FEMUR 2 VIEWS COMPARISON:  None. FINDINGS: Comminuted  intertrochanteric fracture of the proximal left femur with separate fracture components across the lesser and greater trochanters. Primary fracture components are mildly displaced, the distal component displacing 1.3 cm laterally. No other fractures.  The hip and knee joints are normally aligned. No bone lesions. IMPRESSION: 1. Comminuted, mildly displaced, non angulated intertrochanteric fracture of  the proximal left femur. No dislocation. Electronically Signed   By: Lajean Manes M.D.   On: 11/23/2018 10:34    EKG: Independently reviewed. Sinus RBBB    Time spent:60 minutes Code Status:   FULL Family Communication:  No Family at bedside Disposition Plan: expect 2-3 day hospitalization Consults called: Ortho--hewitt DVT Prophylaxis: Gerald Stabs, DO  [facility] Pager [phone]  If 7PM-7AM, please contact night-coverage [email or web address] Password [email or web address] 11/23/2018, 11:00 AM

## 2018-11-23 NOTE — Op Note (Signed)
11/23/2018  3:06 PM  PATIENT:  [name]  85 y.o. male  PRE-OPERATIVE DIAGNOSIS: Left proximal femur intertrochanteric fracture  POST-OPERATIVE DIAGNOSIS: Same  Procedure(s): Open treatment of left proximal femur intertrochanteric fracture with intramedullary fixation  SURGEON:  Wylene Simmer, MD  ASSISTANT: None  ANESTHESIA:   General  EBL:  250 cc  TOURNIQUET: None  COMPLICATIONS:  None apparent  DISPOSITION:  Extubated, awake and stable to recovery.  INDICATION FOR PROCEDURE: The patient is an 85 year old male who was walking back from his garden this morning when his 75-year-old [place] retriever knocked him down.  He fell onto his left side.  He was unable to bear weight.  He was brought to the emergency department where x-rays revealed a displaced left hip intertrochanteric fracture.  He was transferred to [facility] where he presents for operative treatment of this displaced and unstable left hip fracture. The risks and benefits of the alternative treatment options have been discussed in detail.  The patient wishes to proceed with surgery and specifically understands risks of bleeding, infection, nerve damage, blood clots, need for additional surgery, amputation and death.  PROCEDURE IN DETAIL: After preoperative consent was obtained and the correct operative site was identified, the patient was brought to the operating room on a stretcher.  General anesthesia was induced.  Preoperative antibiotics were administered.  A surgical timeout was taken.  The patient was then transferred onto the fracture table.  The left lower extremity was positioned in a fracture boot, and the right lower extremity was positioned in a well leg holder.  The fracture was reduced.  The left lower extremity was then prepped and draped in standard sterile fashion.  An incision was then made proximal from the tip of the greater trochanter.  Dissection was carried down to the subcutaneous tissues.   Fascia was incised.  A guidewire was inserted in the proximal femur in line with the medullary canal under fluoroscopic guidance.  The starter reamer was then used to open the medullary canal.  A Biomet affixes short trochanteric nail was inserted in line with the inferior part of the femoral neck.  The targeting guide was used to insert a guidepin into the femoral head.  Radiographs confirmed appropriate position of the pin.  It was overdrilled and a lag screw was inserted.  This was used to compress the fracture site appropriately.  The targeting guide was then used to insert a distal interlock screw through the inferior tip of the nail.  AP and lateral radiographs confirmed appropriate reduction of the fracture in appropriate position and length of all hardware.  The wounds were irrigated copiously and closed with Vicryl, Monocryl and staples.  Sterile dressings were applied.  The patient was awakened from anesthesia and transported to the recovery room in stable condition.   FOLLOW UP PLAN: Weightbearing as tolerated of the left lower extremity.  Start physical therapy and Occupational Therapy on postop day 1.  Blood thinners on postop day 1 as well.  Follow-up in the office in 2 to 3 weeks for suture removal.

## 2018-11-24 LAB — CBC
HCT: 22.4 % — ABNORMAL LOW (ref 39.0–52.0)
Hemoglobin: 7.5 g/dL — ABNORMAL LOW (ref 13.0–17.0)
MCH: 35 pg — ABNORMAL HIGH (ref 26.0–34.0)
MCHC: 33.5 g/dL (ref 30.0–36.0)
MCV: 104.7 fL — ABNORMAL HIGH (ref 80.0–100.0)
Platelets: 90 10*3/uL — ABNORMAL LOW (ref 150–400)
RBC: 2.14 MIL/uL — ABNORMAL LOW (ref 4.22–5.81)
RDW: 13.1 % (ref 11.5–15.5)
WBC: 6.9 10*3/uL (ref 4.0–10.5)
nRBC: 0 % (ref 0.0–0.2)

## 2018-11-24 LAB — BASIC METABOLIC PANEL
Anion gap: 8 (ref 5–15)
BUN: 30 mg/dL — ABNORMAL HIGH (ref 8–23)
CO2: 22 mmol/L (ref 22–32)
Calcium: 8.1 mg/dL — ABNORMAL LOW (ref 8.9–10.3)
Chloride: 106 mmol/L (ref 98–111)
Creatinine, Ser: 2.58 mg/dL — ABNORMAL HIGH (ref 0.61–1.24)
GFR calc Af Amer: 25 mL/min — ABNORMAL LOW (ref >60)
GFR calc non Af Amer: 21 mL/min — ABNORMAL LOW (ref >60)
Glucose, Bld: 192 mg/dL — ABNORMAL HIGH (ref 70–99)
Potassium: 5.8 mmol/L — ABNORMAL HIGH (ref 3.5–5.1)
Sodium: 136 mmol/L (ref 135–145)

## 2018-11-24 MED ORDER — HYDROCODONE-ACETAMINOPHEN 5-325 MG PO TABS: 1.0000 | ORAL_TABLET | ORAL | 0 refills | Status: AC | PRN

## 2018-11-24 MED ORDER — ENOXAPARIN SODIUM 40 MG/0.4ML ~~LOC~~ SOLN: 40.0000 mg | SUBCUTANEOUS | 0 refills | Status: DC

## 2018-11-24 NOTE — Progress Notes (Signed)
Orthopedic Tech Progress Note Patient Details:  [facility] 1928-09-29 [id number]  Ortho Devices Type of Ortho Device: Crutches Ortho Device/Splint Interventions: Application   Post Interventions Patient Tolerated: Well Instructions Provided: Care of device   [place] Pink 11/24/2018, 1:43 PM

## 2018-11-24 NOTE — Evaluation (Signed)
Occupational Therapy Evaluation Patient Details Name: [name] MRN: [id number] DOB: 08/27/1928 Today's Date: 11/24/2018    History of Present Illness Pt is an 85 year old man admitted after falling in his garden, knocked down by his dog. Underwent IM nail of L hip.   Clinical Impression   Pt was independent prior to admission. Pt performing remarkably well. Requires min assist for mobility and up to max assist for ADL. Pt likely to progress well. Will follow acutely.   Follow Up Recommendations  Home health OT;Supervision/Assistance - 24 hour    Equipment Recommendations  3 in 1 bedside commode    Recommendations for Other Services       Precautions / Restrictions Precautions Precautions: Fall Restrictions Weight Bearing Restrictions: Yes LLE Weight Bearing: Weight bearing as tolerated      Mobility Bed Mobility               General bed mobility comments: pt standing with PT upon OT's arrival  Transfers   Equipment used: Rolling walker (2 wheeled)                  Balance Overall balance assessment: Needs assistance   Sitting balance-Leahy Scale: Good     Standing balance support: Bilateral upper extremity supported Standing balance-Leahy Scale: Poor                             ADL either performed or assessed with clinical judgement   ADL Overall ADL's : Needs assistance/impaired Eating/Feeding: Independent;Sitting   Grooming: Set up;Sitting   Upper Body Bathing: Set up;Sitting   Lower Body Bathing: Maximal assistance;Sit to/from stand   Upper Body Dressing : Set up;Sitting   Lower Body Dressing: Maximal assistance;Sit to/from stand   Toilet Transfer: Minimal assistance;Ambulation;RW;BSC   Toileting- Clothing Manipulation and Hygiene: Maximal assistance;Sit to/from stand       Functional mobility during ADLs: Minimal assistance;Rolling walker       Vision Patient Visual Report: No change from baseline        Perception     Praxis      Pertinent Vitals/Pain Pain Assessment: Faces Faces Pain Scale: Hurts little more Pain Location: L hip Pain Descriptors / Indicators: Sore Pain Intervention(s): Monitored during session;Repositioned     [place] Dominance Right   Extremity/Trunk Assessment Upper Extremity Assessment Upper Extremity Assessment: Overall WFL for tasks assessed   Lower Extremity Assessment Lower Extremity Assessment: Defer to PT evaluation   Cervical / Trunk Assessment Cervical / Trunk Assessment: Kyphotic   Communication Communication Communication: HOH   Cognition Arousal/Alertness: Awake/alert Behavior During Therapy: WFL for tasks assessed/performed Overall Cognitive Status: Within Functional Limits for tasks assessed                                     General Comments       Exercises     Shoulder Instructions      Home Living Family/patient expects to be discharged to:: Private residence Living Arrangements: Spouse/significant other Available Help at Discharge: Family;Available 24 hours/day Type of Home: House Home Access: Stairs to enter [employer] of Steps: 1 Entrance Stairs-Rails: Right Home Layout: One level     Bathroom Shower/Tub: Occupational psychologist: Handicapped height     Home Equipment: Grab bars - toilet;Walker - 2 wheels  Prior Functioning/Environment Level of Independence: Independent        Comments: likes to garden        OT Problem List: Impaired balance (sitting and/or standing);Decreased knowledge of use of DME or AE;Pain      OT Treatment/Interventions: Self-care/ADL training;DME and/or AE instruction;Therapeutic activities;Patient/family education;Balance training    OT Goals(Current goals can be found in the care plan section) Acute Rehab OT Goals Patient Stated Goal: return to gardening OT Goal Formulation: With patient Time For Goal Achievement:  12/08/18 Potential to Achieve Goals: Good ADL Goals Pt Will Perform Grooming: with supervision;standing Pt Will Perform Lower Body Bathing: with supervision;with adaptive equipment;with caregiver independent in assisting;sit to/from stand Pt Will Perform Lower Body Dressing: with supervision;sit to/from stand;with adaptive equipment Pt Will Transfer to Toilet: with supervision;ambulating;bedside commode Pt Will Perform Toileting - Clothing Manipulation and hygiene: with supervision;sit to/from stand Pt Will Perform Tub/Shower Transfer: with min assist;ambulating;3 in 1;rolling walker  OT Frequency: Min 2X/week   Barriers to D/C:            Co-evaluation PT/OT/SLP Co-Evaluation/Treatment: Yes Reason for Co-Treatment: For patient/therapist safety   OT goals addressed during session: ADL's and self-care;Proper use of Adaptive equipment and DME      AM-PAC OT "6 Clicks" Daily Activity     Outcome Measure Help from another person eating meals?: None Help from another person taking care of personal grooming?: A Little Help from another person toileting, which includes using toliet, bedpan, or urinal?: A Lot Help from another person bathing (including washing, rinsing, drying)?: A Lot Help from another person to put on and taking off regular upper body clothing?: A Little Help from another person to put on and taking off regular lower body clothing?: A Lot 6 Click Score: 16   End of Session Equipment Utilized During Treatment: Gait belt;Rolling walker  Activity Tolerance: Patient tolerated treatment well Patient left: in chair;with call bell/phone within reach;with family/visitor present  OT Visit Diagnosis: Unsteadiness on feet (R26.81);Other abnormalities of gait and mobility (R26.89);Pain                Time: 3235-5732 OT Time Calculation (min): 14 min Charges:  OT General Charges $OT Visit: 1 Visit OT Evaluation $OT Eval Moderate Complexity: 1 Mod  Nestor Lewandowsky,  OTR/L Acute Rehabilitation Services Pager: [phone] Office: [phone]  Malka So 11/24/2018, 11:52 AM

## 2018-11-24 NOTE — Plan of Care (Signed)
  Problem: Education: Goal: Knowledge of General Education information will improve Description Including pain rating scale, medication(s)/side effects and non-pharmacologic comfort measures Outcome: Progressing   

## 2018-11-24 NOTE — Progress Notes (Signed)
PROGRESS NOTE  [name] UEA:[id number] DOB: 07-12-1928 DOA: 11/23/2018 PCP: Karsten Ro, DO   LOS: 1 day   Brief narrative: Patient is a 85 y.o. male with medical history of hypertension, CKD stage IV, skin cancer, GERD, AAA who presented to the [facility] ED after a mechanical fall. He tripped over his dog while he was gardening, fell onto his left side and had pain and was not able to get up.    EMS was called and patient was subsequently brought to the ED.   In the ED, patient was afebrile, hemodynamically stable, saturating 100% room air.   BMP was unremarkable with a serum creatinine 2.04 which is at baseline.   WBC 6.4, hemoglobin 11.1 at baseline, platelet 100,000.   Orthopedics, Dr. Doran Durand was consulted, and recommended transfer to [facility] for operative management. Patient underwent intramedullary fixation on 8/2.   Subjective: Patient was seen and examined this morning.  Pleasant, elderly Caucasian male.  Assessment/Plan:  Active Problems:   HTN (hypertension)   Displaced intertrochanteric fracture of left femur, initial encounter for closed fracture (HCC)   CKD (chronic kidney disease) stage 4, GFR 15-29 ml/min (HCC)  Left intratrochanteric femur fracture -Underwent intramedullary fixation on 8/2. -PT evaluation, pain control, disposition plan. -DVT prophylaxis with Lovenox.  Essential hypertension -Continue amlodipine.  Lasix remains on hold.  Acute kidney injury on CKD stage IV -Baseline creatinine 1.9-2.1.  Creatinine was 2.18 on presentation.  Postop creatinine bumped up to 2.58, could be from transient hypotension Intra-Op. -Lasix on hold.  Currently on normal saline at 75 mils per hour which I will continue.  Hypothyroidism -Continue Synthroid  GERD -Continue PPI  Body mass index is 26.45 kg/m. Mobility: PT eval Diet: Cardiac diet DVT prophylaxis:  Lovenox subcu Code Status:   Code Status: Full Code  Family Communication:  Expected Discharge:   Home with therapy versus rehab pending PT eval  Consultants:  Orthopedics  Procedures:  Intramedullary fixation on 8/2  Antimicrobials:  Anti-infectives (From admission, onward)   Start     Dose/Rate Route Frequency Ordered Stop   11/23/18 1321  ceFAZolin (ANCEF) 2-4 GM/100ML-% IVPB    Note to Pharmacy: Bobbie Stack   : cabinet override      11/23/18 1321 11/24/18 0129      Infusions:  . sodium chloride 75 mL/hr at 11/24/18 0305  . methocarbamol (ROBAXIN) IV      Scheduled Meds: . amLODipine  10 mg Oral Daily  . docusate sodium  100 mg Oral BID  . enoxaparin (LOVENOX) injection  30 mg Subcutaneous Q24H  . furosemide  10 mg Oral Q M,W,F  . levothyroxine  150 mcg Oral QAC breakfast  . multivitamin  1 tablet Oral Daily  . multivitamin with minerals  1 tablet Oral Daily  . omega-3 acid ethyl esters  2 g Oral Daily  . pantoprazole  40 mg Oral Daily  . senna  1 tablet Oral BID    PRN meds: bisacodyl, HYDROcodone-acetaminophen, magnesium citrate, methocarbamol **OR** methocarbamol (ROBAXIN) IV, morphine injection, ondansetron **OR** ondansetron (ZOFRAN) IV, polyethylene glycol   Objective: Vitals:   11/24/18 0416 11/24/18 0905  BP: (!) 117/58 (!) 108/56  Pulse: (!) 54 (!) 57  Resp: 16 17  Temp: (!) 97.3 F (36.3 C) (!) 97.4 F (36.3 C)  SpO2: 94% 97%    Intake/Output Summary (Last 24 hours) at 11/24/2018 1352 Last data filed at 11/23/2018 1442 Gross per 24 hour  Intake 500 ml  Output 150 ml  Net 350 ml   Filed Weights   11/23/18 0848  Weight: 88.5 kg   Weight change:  Body mass index is 26.45 kg/m.   Physical Exam: General exam: Appears calm and comfortable.  Skin: No rashes, lesions or ulcers. HEENT: Atraumatic, normocephalic, supple neck, no obvious bleeding Lungs: Clear to auscultation bilaterally CVS: Regular rate and rhythm, no murmur GI/Abd soft, nontender, nondistended, bowel sound present CNS: Alert, awake, oriented x3 Psychiatry: Mood  appropriate Extremities: No pedal edema, no calf tenderness  Data Review: I have personally reviewed the laboratory data and studies available.  CBC    Component Value Date/Time   WBC 6.9 11/24/2018 0513   RBC 2.14 (L) 11/24/2018 0513   HGB 7.5 (L) 11/24/2018 0513   HCT 22.4 (L) 11/24/2018 0513   PLT 90 (L) 11/24/2018 0513   MCV 104.7 (H) 11/24/2018 0513   MCH 35.0 (H) 11/24/2018 0513   MCHC 33.5 11/24/2018 0513   RDW 13.1 11/24/2018 0513   LYMPHSABS 1.6 11/23/2018 0858   MONOABS 0.3 11/23/2018 0858   EOSABS 0.3 11/23/2018 0858   BASOSABS 0.0 11/23/2018 0858   Recent Labs  Lab 11/23/18 0858 11/23/18 1637 11/24/18 0513  NA 142  --  136  K 4.6  --  5.8*  CL 110  --  106  CO2 23  --  22  GLUCOSE 143*  --  192*  BUN 29*  --  30*  CREATININE 2.04* 2.18* 2.58*  CALCIUM 9.6  --  8.1*    Terrilee Croak, MD  [facility] 11/24/2018

## 2018-11-24 NOTE — Progress Notes (Signed)
Initial Nutrition Assessment   RD working remotely.  DOCUMENTATION CODES:   Not applicable  INTERVENTION: Provide Ensure Enlive po BID, each supplement provides 350 kcal and 20 grams of protein.  Encourage adequate PO intake.   NUTRITION DIAGNOSIS:   Increased nutrient needs related to post-op healing as evidenced by estimated needs.  GOAL:   Patient will meet greater than or equal to 90% of their needs  MONITOR:   PO intake, Supplement acceptance, Skin, Weight trends, Labs, I & O's  REASON FOR ASSESSMENT:   Consult Assessment of nutrition requirement/status, Hip fracture protocol  ASSESSMENT:   85 y.o. male with medical history of hypertension, CKD stage IV, skin cancer, GERD, AAA who presented after a mechanical fall. Pt with Left intratrochanteric femur fracture  Procedure (8/2): Open treatment of left proximal femur intertrochanteric fracture with intramedullary fixation   Pt unavailable during attempted time of contact. RD unable to obtain pt nutrition history at this time. Meal completion has been 75%. RD to order nutritional supplements to aid in post op healing.   Unable to complete Nutrition-Focused physical exam at this time.   Labs and medications reviewed. Potassium elevate at 5.8.  Diet Order:   Diet Order            Diet Heart Room service appropriate? Yes; Fluid consistency: Thin  Diet effective now              EDUCATION NEEDS:   Not appropriate for education at this time  Skin:  Skin Assessment: Skin Integrity Issues: Skin Integrity Issues:: Incisions Incisions: L thigh  Last BM:  8/1  Height:   Ht Readings from Last 1 Encounters:  11/23/18 6' (1.829 m)    Weight:   Wt Readings from Last 1 Encounters:  11/23/18 88.5 kg    Ideal Body Weight:  80.9 kg  BMI:  Body mass index is 26.45 kg/m.  Estimated Nutritional Needs:   Kcal:  2000-2200  Protein:  90-100 grams  Fluid:  >/= 2 L/day    Corrin Parker, MS, RD,  LDN Pager # [phone] After hours/ weekend pager # [phone]

## 2018-11-24 NOTE — Evaluation (Signed)
Physical Therapy Evaluation Patient Details Name: [name] MRN: [id number] DOB: 05-27-1928 Today's Date: 11/24/2018   History of Present Illness  Pt is an 85 year old man admitted after falling in his garden, knocked down by his dog. Underwent IM nail of L hip.  Clinical Impression  Patient received in bed, pleasant, talkative. Agrees to PT evaluation. Patient reports no pain at rest, min/mod pain with movement and weight bearing. He requires min assist with supine to sit, and sit to stand, ambulated 25 feet with RW and WBAT, min guard. Patient will benefit from continued skilled PT while here to improve ambulation, transfers and strength to return home with wife at discharge.      Follow Up Recommendations Home health PT    Equipment Recommendations  None recommended by PT    Recommendations for Other Services       Precautions / Restrictions Precautions Precautions: Fall Restrictions Weight Bearing Restrictions: Yes LLE Weight Bearing: Weight bearing as tolerated      Mobility  Bed Mobility Overal bed mobility: Needs Assistance Bed Mobility: Supine to Sit     Supine to sit: Min assist     General bed mobility comments: min assist with moving L LE off bed, min assist to raise trunk into seated position  Transfers Overall transfer level: Needs assistance Equipment used: Rolling walker (2 wheeled) Transfers: Sit to/from Stand Sit to Stand: Min guard;From elevated surface            Ambulation/Gait Ambulation/Gait assistance: Min guard Gait Distance (Feet): 25 Feet Assistive device: Rolling walker (2 wheeled) Gait Pattern/deviations: Step-to pattern;Decreased step length - right;Decreased stride length;Decreased stance time - left;Antalgic Gait velocity: decreased   General Gait Details: patient is safe with ambulation, heavy reliance on RW  Stairs            Wheelchair Mobility    Modified Rankin (Stroke Patients Only)       Balance Overall  balance assessment: Needs assistance Sitting-balance support: Feet supported Sitting balance-Leahy Scale: Good     Standing balance support: Bilateral upper extremity supported Standing balance-Leahy Scale: Fair                               Pertinent Vitals/Pain Pain Assessment: 0-10 Pain Score: 4  Faces Pain Scale: Hurts little more Pain Location: L hip with activity Pain Descriptors / Indicators: Sore;Operative site guarding Pain Intervention(s): Limited activity within patient's tolerance;Monitored during session;Repositioned    Home Living Family/patient expects to be discharged to:: Private residence Living Arrangements: Spouse/significant other Available Help at Discharge: Family;Available 24 hours/day Type of Home: House Home Access: Stairs to enter Entrance Stairs-Rails: Right Entrance Stairs-Number of Steps: 1 Home Layout: One level Home Equipment: Walker - 2 wheels;Grab bars - toilet      Prior Function Level of Independence: Independent         Comments: very active at baseline per wife     Knieriem Dominance   Dominant Shiroma: Right    Extremity/Trunk Assessment   Upper Extremity Assessment Upper Extremity Assessment: Defer to OT evaluation    Lower Extremity Assessment Lower Extremity Assessment: Overall WFL for tasks assessed    Cervical / Trunk Assessment Cervical / Trunk Assessment: Kyphotic  Communication   Communication: HOH  Cognition Arousal/Alertness: Awake/alert Behavior During Therapy: WFL for tasks assessed/performed Overall Cognitive Status: Within Functional Limits for tasks assessed  General Comments      Exercises Other Exercises Other Exercises: seated LAQ x 5 on left, long sitting ap, hip abd/add, glute and quad sets x 10 reps each   Assessment/Plan    PT Assessment Patient needs continued PT services  PT Problem List Decreased strength;Decreased  mobility;Decreased activity tolerance;Decreased knowledge of precautions;Decreased knowledge of use of DME;Pain;Decreased range of motion       PT Treatment Interventions DME instruction;Therapeutic activities;Gait training;Therapeutic exercise;Patient/family education;Stair training;Functional mobility training;Neuromuscular re-education    PT Goals (Current goals can be found in the Care Plan section)  Acute Rehab PT Goals Patient Stated Goal: to return home with wife PT Goal Formulation: With patient Time For Goal Achievement: 12/01/18 Potential to Achieve Goals: Good    Frequency Min 5X/week   Barriers to discharge        Co-evaluation PT/OT/SLP Co-Evaluation/Treatment: Yes Reason for Co-Treatment: To address functional/ADL transfers;For patient/therapist safety PT goals addressed during session: Mobility/safety with mobility OT goals addressed during session: ADL's and self-care;Proper use of Adaptive equipment and DME       AM-PAC PT "6 Clicks" Mobility  Outcome Measure Help needed turning from your back to your side while in a flat bed without using bedrails?: A Little Help needed moving from lying on your back to sitting on the side of a flat bed without using bedrails?: A Little Help needed moving to and from a bed to a chair (including a wheelchair)?: A Little Help needed standing up from a chair using your arms (e.g., wheelchair or bedside chair)?: A Little Help needed to walk in hospital room?: A Little Help needed climbing 3-5 steps with a railing? : A Little 6 Click Score: 18    End of Session Equipment Utilized During Treatment: Gait belt Activity Tolerance: Patient tolerated treatment well Patient left: in chair;with call bell/phone within reach;with family/visitor present Nurse Communication: Mobility status PT Visit Diagnosis: Muscle weakness (generalized) (M62.81);Difficulty in walking, not elsewhere classified (R26.2);History of falling  (Z91.81);Pain Pain - Right/Left: Left Pain - part of body: Leg    Time: 1030-1100 PT Time Calculation (min) (ACUTE ONLY): 30 min   Charges:   PT Evaluation $PT Eval Moderate Complexity: 1 Mod PT Treatments $Gait Training: 8-22 mins        Kia Stavros, PT, GCS 11/24/18,12:03 PM

## 2018-11-24 NOTE — Progress Notes (Signed)
Subjective: 1 Day Post-Op Procedure(s) (LRB): INTRAMEDULLARY (IM) NAIL INTERTROCHANTRIC (Left)  Patient reports pain as mild to moderate.  Denies fever, chills, N/V, CP, SOB.  Eating breakfast.  Resting comfortably in bed.    Objective:   VITALS:  Temp:  [97.3 F (36.3 C)-98.2 F (36.8 C)] 97.3 F (36.3 C) (08/03 0416) Pulse Rate:  [50-62] 54 (08/03 0416) Resp:  [11-31] 16 (08/03 0416) BP: (117-177)/(54-82) 117/58 (08/03 0416) SpO2:  [94 %-100 %] 94 % (08/03 0416) Weight:  [88.5 kg] 88.5 kg (08/02 0848)  General: WDWN patient in NAD. Psych:  Appropriate mood and affect. Neuro:  A&O x 3, Moving all extremities, sensation intact to light touch HEENT:  EOMs intact Chest:  Even non-labored respirations Skin: Dressing C/D/I, no rashes or lesions Extremities: warm/dry, mild edema, no erythema or echymosis.  No lymphadenopathy. Pulses: Popliteus 2+ MSK:  ROM: TKE, MMT: able to perform quad set, (-) Homan's    LABS Recent Labs    11/23/18 0858 11/23/18 1637 11/24/18 0513  HGB 11.1* 9.1* 7.5*  WBC 6.4 7.2 6.9  PLT 100* 104* 90*   Recent Labs    11/23/18 0858 11/23/18 1637 11/24/18 0513  NA 142  --  136  K 4.6  --  5.8*  CL 110  --  106  CO2 23  --  22  BUN 29*  --  30*  CREATININE 2.04* 2.18* 2.58*  GLUCOSE 143*  --  192*   Recent Labs    11/23/18 0858  INR 1.0     Assessment/Plan: 1 Day Post-Op Procedure(s) (LRB): INTRAMEDULLARY (IM) NAIL INTERTROCHANTRIC (Left)  WBAT L LE Up with therapy Resume blood thinners today Disp: pending  Mohammed Kindle Office:  [phone]

## 2018-11-25 ENCOUNTER — Encounter (HOSPITAL_COMMUNITY): Payer: Self-pay | Admitting: Orthopedic Surgery

## 2018-11-25 LAB — CBC WITH DIFFERENTIAL/PLATELET
Abs Immature Granulocytes: 0.05 10*3/uL (ref 0.00–0.07)
Basophils Absolute: 0 10*3/uL (ref 0.0–0.1)
Basophils Relative: 0 %
Eosinophils Absolute: 0.1 10*3/uL (ref 0.0–0.5)
Eosinophils Relative: 2 %
HCT: 20.2 % — ABNORMAL LOW (ref 39.0–52.0)
Hemoglobin: 6.5 g/dL — CL (ref 13.0–17.0)
Immature Granulocytes: 1 %
Lymphocytes Relative: 27 %
Lymphs Abs: 1.9 10*3/uL (ref 0.7–4.0)
MCH: 34.8 pg — ABNORMAL HIGH (ref 26.0–34.0)
MCHC: 32.2 g/dL (ref 30.0–36.0)
MCV: 108 fL — ABNORMAL HIGH (ref 80.0–100.0)
Monocytes Absolute: 0.3 10*3/uL (ref 0.1–1.0)
Monocytes Relative: 4 %
Neutro Abs: 4.8 10*3/uL (ref 1.7–7.7)
Neutrophils Relative %: 66 %
Platelets: 83 10*3/uL — ABNORMAL LOW (ref 150–400)
RBC: 1.87 MIL/uL — ABNORMAL LOW (ref 4.22–5.81)
RDW: 13.5 % (ref 11.5–15.5)
WBC: 7.2 10*3/uL (ref 4.0–10.5)
nRBC: 0 % (ref 0.0–0.2)

## 2018-11-25 LAB — BASIC METABOLIC PANEL
Anion gap: 10 (ref 5–15)
BUN: 39 mg/dL — ABNORMAL HIGH (ref 8–23)
CO2: 22 mmol/L (ref 22–32)
Calcium: 8.5 mg/dL — ABNORMAL LOW (ref 8.9–10.3)
Chloride: 105 mmol/L (ref 98–111)
Creatinine, Ser: 2.44 mg/dL — ABNORMAL HIGH (ref 0.61–1.24)
GFR calc Af Amer: 26 mL/min — ABNORMAL LOW (ref >60)
GFR calc non Af Amer: 23 mL/min — ABNORMAL LOW (ref >60)
Glucose, Bld: 128 mg/dL — ABNORMAL HIGH (ref 70–99)
Potassium: 4.6 mmol/L (ref 3.5–5.1)
Sodium: 137 mmol/L (ref 135–145)

## 2018-11-25 LAB — IRON AND TIBC
Iron: 37 ug/dL — ABNORMAL LOW (ref 45–182)
Saturation Ratios: 15 % — ABNORMAL LOW (ref 17.9–39.5)
TIBC: 244 ug/dL — ABNORMAL LOW (ref 250–450)
UIBC: 207 ug/dL

## 2018-11-25 LAB — PREPARE RBC (CROSSMATCH)

## 2018-11-25 LAB — FERRITIN: Ferritin: 209 ng/mL (ref 24–336)

## 2018-11-25 MED ORDER — ASPIRIN 325 MG PO TABS
325.0000 mg | ORAL_TABLET | Freq: Two times a day (BID) | ORAL | Status: DC
  Administered 2018-11-25: 325 mg via ORAL
  Filled 2018-11-25: qty 1

## 2018-11-25 MED ORDER — DOCUSATE SODIUM 100 MG PO CAPS: 100.0000 mg | ORAL_CAPSULE | Freq: Two times a day (BID) | ORAL | 0 refills | Status: DC

## 2018-11-25 MED ORDER — SENNA 8.6 MG PO TABS: 2.0000 | ORAL_TABLET | Freq: Two times a day (BID) | ORAL | 0 refills | Status: DC

## 2018-11-25 MED ORDER — ASPIRIN EC 325 MG PO TBEC: 325.0000 mg | DELAYED_RELEASE_TABLET | Freq: Two times a day (BID) | ORAL | 0 refills | Status: DC

## 2018-11-25 MED ORDER — SODIUM CHLORIDE 0.9% IV SOLUTION
Freq: Once | INTRAVENOUS | Status: AC
  Administered 2018-11-25: 14:00:00 via INTRAVENOUS

## 2018-11-25 NOTE — Progress Notes (Signed)
CRITICAL VALUE ALERT  Critical Value: Hg 6.5  Date & Time Notied: 11/25/2018  Provider Notified: Yes  Orders Received/Actions taken: Pending

## 2018-11-25 NOTE — Progress Notes (Signed)
WB status: WBAT L LE DVT ppx: lovenox x 2 weeks upon D/C Pain control: Rx for noroc 5/325 mg has been sent to patient's pharmacy.  Will plan to see patient this afternoon if he hasn't already been D/C'd.  Mechele Claude PA-C [facility] Office:  [phone]

## 2018-11-25 NOTE — Progress Notes (Signed)
Physical Therapy Treatment Patient Details Name: [name] MRN: [id number] DOB: [birth year] Today's Date: 11/25/2018    History of Present Illness Pt is an 85 year old man admitted after falling in his garden, knocked down by his dog. Underwent IM nail of L hip.    PT Comments    Pt progressing well.  He remains to present with antlagic gt.  Wife present to observe session.  Guided patient through stair training.  Pt continues to benefit from HHPT at d/c.  HEP performed and issued to patient as well.  Pt's wife reports she does not know where his RW is located and he does not have a 3:1 at home.  Will continue to follow during hospital stay.    Follow Up Recommendations  Home health PT     Equipment Recommendations  Rolling walker with 5" wheels;3in1 (PT)(wife reports she does not know where RW is)    Recommendations for Other Services       Precautions / Restrictions Precautions Precautions: Fall Restrictions Weight Bearing Restrictions: Yes LLE Weight Bearing: Weight bearing as tolerated    Mobility  Bed Mobility               General bed mobility comments: Pt in recliner on arrival.  Transfers Overall transfer level: Needs assistance Equipment used: Rolling walker (2 wheeled) Transfers: Sit to/from Stand Sit to Stand: Supervision         General transfer comment: Cues for scooting forward to position and Schindler placement.  Ambulation/Gait Ambulation/Gait assistance: Min guard Gait Distance (Feet): 30 Feet(+ 8 ft trial to walk to stair step and back .) Assistive device: Rolling walker (2 wheeled) Gait Pattern/deviations: Step-to pattern;Decreased step length - right;Decreased stride length;Decreased stance time - left;Antalgic     General Gait Details: Heavy UE use,  Cues for weight bearing L to soften landing on R side.   Stairs Stairs: Yes Stairs assistance: Min assist Stair Management: No rails;Backwards;Step to pattern Number of Stairs: 1 General  stair comments: Cues for sequencing and RW placement.   Wheelchair Mobility    Modified Rankin (Stroke Patients Only)       Balance Overall balance assessment: Needs assistance Sitting-balance support: Feet supported Sitting balance-Leahy Scale: Good       Standing balance-Leahy Scale: Fair                              Cognition Arousal/Alertness: Awake/alert Behavior During Therapy: WFL for tasks assessed/performed Overall Cognitive Status: Within Functional Limits for tasks assessed                                        Exercises Total Joint Exercises Ankle Circles/Pumps: AROM;Both;20 reps;Supine Quad Sets: AROM;Left;10 reps;Supine Short Arc Quad: AROM;Left;10 reps;Supine Heel Slides: AAROM;Left;10 reps;Supine Hip ABduction/ADduction: AAROM;Left;10 reps;Supine Long Arc Quad: AROM;Left;10 reps;Seated    General Comments        Pertinent Vitals/Pain Pain Assessment: 0-10 Pain Score: 6  Pain Location: L hip with activity Pain Descriptors / Indicators: Sore;Operative site guarding Pain Intervention(s): Monitored during session;Repositioned    Home Living                      Prior Function            PT Goals (current goals can now be found in the care  plan section) Acute Rehab PT Goals Patient Stated Goal: to return home with wife PT Goal Formulation: With patient Potential to Achieve Goals: Good    Frequency    Min 5X/week      PT Plan Current plan remains appropriate    Co-evaluation              AM-PAC PT "6 Clicks" Mobility   Outcome Measure  Help needed turning from your back to your side while in a flat bed without using bedrails?: A Little Help needed moving from lying on your back to sitting on the side of a flat bed without using bedrails?: A Little Help needed moving to and from a bed to a chair (including a wheelchair)?: A Little Help needed standing up from a chair using your arms  (e.g., wheelchair or bedside chair)?: A Little Help needed to walk in hospital room?: A Little Help needed climbing 3-5 steps with a railing? : A Little 6 Click Score: 18    End of Session Equipment Utilized During Treatment: Gait belt Activity Tolerance: Patient tolerated treatment well Patient left: in chair;with call bell/phone within reach;with family/visitor present Nurse Communication: Mobility status PT Visit Diagnosis: Muscle weakness (generalized) (M62.81);Difficulty in walking, not elsewhere classified (R26.2);History of falling (Z91.81);Pain Pain - Right/Left: Left Pain - part of body: Leg     Time: 1211-1237 PT Time Calculation (min) (ACUTE ONLY): 26 min  Charges:  $Gait Training: 8-22 mins $Therapeutic Exercise: 8-22 mins                     [name], PTA Acute Rehabilitation Services Pager [phone] Office [phone]     [name] 11/25/2018, 12:45 PM

## 2018-11-25 NOTE — Progress Notes (Signed)
PROGRESS NOTE  [name] YSA:[id number] DOB: 06-30-1928 DOA: 11/23/2018 PCP: Karsten Ro, DO   LOS: 2 days   Brief narrative: Patientis a 85 y.o.malewith medical history ofhypertension, CKD stage IV, skin cancer, GERD, AAA who presented to the [facility] ED after a mechanical fall. He tripped over his dog while he was gardening, fell onto his left side and had pain and was not able to get up.   EMS was called and patient was subsequently brought to the ED.   In the ED, patient was afebrile, hemodynamically stable, saturating 100% room air.  BMP was unremarkable with a serum creatinine 2.04 which is at baseline.  WBC 6.4, hemoglobin 11.1 at baseline, platelet 100,000.   Orthopedics, Dr. Doran Durand was consulted, and recommended transfer to [facility] for operative management. Patient underwent intramedullary fixation on 8/2.  Subjective: Patient was seen and examined this morning.  Pleasant, elderly, Caucasian male.  Sitting up in bed.  Not in distress.  No bleeding.  Labs from this morning showed hemoglobin dropped to 6.5, creatinine slightly improved from yesterday to 2.44.  Assessment/Plan:  Active Problems:   HTN (hypertension)   Displaced intertrochanteric fracture of left femur, initial encounter for closed fracture (HCC)   CKD (chronic kidney disease) stage 4, GFR 15-29 ml/min (HCC)  Left intratrochanteric femur fracture -Underwent intramedullary fixation on 8/2. -PT evaluation, pain control, disposition plan. -Patient was ordered Lovenox for DVT, but he was not getting it because of low platelets.  Discussed with orthopedics Dr. Doran Durand this morning.  Switch to aspirin 325 mg twice daily.  However I had to stop that as well after the labs showed a low hemoglobin of 6.5 today.  Acute on chronic anemia -Patient has anemia of chronic disease due to CKD. -Baseline hemoglobin 8-9.  Hemoglobin dropped to 6.5 this morning.  Likely because of intraoperative blood.  No active bleeding.   Last colonoscopy was 8 years ago and was normal per patient.  Patient does not have any history of hematemesis or melena or hematochezia.  Obtain FOBT.  Transfused 2 units of PRBC. I have explained to patient and his wife for the risk and benefit of blood transfusion. Consent taken with patient and his wife.  Essential hypertension -Continue amlodipine.  Lasix remains on hold.  Acute kidney injury on CKD stage IV -Baseline creatinine 1.9-2.1.  Creatinine was 2.18 on presentation.  Postop creatinine bumped up to 2.58, slightly improved to 2.44 today.  Continue to monitor. -Lasix on hold.   Hypothyroidism - Continue Synthroid  GERD - Continue PPI  Body mass index is 26.45 kg/m. Mobility: PT eval Diet: Cardiac diet DVT prophylaxis:SCD boots for now.  If FOBT negative, will start on aspirin twice daily at discharge.   Code Status:  Code Status: Full Code  Family Communication:Discussed with patient's wife. Expected Discharge: Home health PT, likely tomorrow.  Consultants:  Orthopedics  Procedures:  Intramedullary fixation on 8/2  Antimicrobials:  Anti-infectives (From admission, onward)   Start     Dose/Rate Route Frequency Ordered Stop   11/23/18 1321  ceFAZolin (ANCEF) 2-4 GM/100ML-% IVPB    Note to Pharmacy: Bobbie Stack   : cabinet override      11/23/18 1321 11/24/18 0129      Infusions:  . sodium chloride 75 mL/hr at 11/24/18 0305  . methocarbamol (ROBAXIN) IV      Scheduled Meds: . sodium chloride   Intravenous Once  . amLODipine  10 mg Oral Daily  . docusate sodium  100  mg Oral BID  . furosemide  10 mg Oral Q M,W,F  . levothyroxine  150 mcg Oral QAC breakfast  . multivitamin  1 tablet Oral Daily  . multivitamin with minerals  1 tablet Oral Daily  . omega-3 acid ethyl esters  2 g Oral Daily  . pantoprazole  40 mg Oral Daily  . senna  1 tablet Oral BID    PRN meds: bisacodyl, HYDROcodone-acetaminophen, magnesium citrate, methocarbamol **OR**  methocarbamol (ROBAXIN) IV, morphine injection, ondansetron **OR** ondansetron (ZOFRAN) IV, polyethylene glycol   Objective: Vitals:   11/25/18 0823 11/25/18 0850  BP: (!) 122/56 (!) 104/56  Pulse:  65  Resp:  16  Temp:  97.8 F (36.6 C)  SpO2:  95%    Intake/Output Summary (Last 24 hours) at 11/25/2018 1249 Last data filed at 11/25/2018 0900 Gross per 24 hour  Intake 240 ml  Output 800 ml  Net -560 ml   Filed Weights   11/23/18 0848  Weight: 88.5 kg   Weight change:  Body mass index is 26.45 kg/m.   Physical Exam: General exam: Appears calm and comfortable.  Skin: No rashes, lesions or ulcers. HEENT: Atraumatic, normocephalic, supple neck, no obvious bleeding Lungs: Clear to auscultation bilaterally CVS: Regular rate and rhythm, no murmur GI/Abd soft, nontender, nondistended, bowel sound present CNS: Alert, awake, oriented x3 Psychiatry: Mood appropriate Extremities: No pedal edema, no calf tenderness  Data Review: I have personally reviewed the laboratory data and studies available.  Recent Labs  Lab 11/23/18 0858 11/23/18 1637 11/24/18 0513 11/25/18 1126  WBC 6.4 7.2 6.9 7.2  NEUTROABS 4.2  --   --  PENDING  HGB 11.1* 9.1* 7.5* 6.5*  HCT 34.4* 27.2* 22.4* 20.2*  MCV 105.8* 106.3* 104.7* 108.0*  PLT 100* 104* 90* 83*   Recent Labs  Lab 11/23/18 0858 11/23/18 1637 11/24/18 0513 11/25/18 1126  NA 142  --  136 137  K 4.6  --  5.8* 4.6  CL 110  --  106 105  CO2 23  --  22 22  GLUCOSE 143*  --  192* 128*  BUN 29*  --  30* 39*  CREATININE 2.04* 2.18* 2.58* 2.44*  CALCIUM 9.6  --  8.1* 8.5*    Terrilee Croak, MD  [facility] 11/25/2018

## 2018-11-25 NOTE — Progress Notes (Addendum)
Subjective: 2 Days Post-Op Procedure(s) (LRB): INTRAMEDULLARY (IM) NAIL INTERTROCHANTRIC (Left)  Patient reports pain as mild to moderate.  Tolerating POs well. Admits to flatus.  States that he worked well with therapy.  Notes that he required transfusion today.  Denies denies/light headedness.  Resting comfortably in chair with wife at his side.  Objective:   VITALS:  Temp:  [97.4 F (36.3 C)-97.8 F (36.6 C)] 97.7 F (36.5 C) (08/04 1402) Pulse Rate:  [54-65] 59 (08/04 1402) Resp:  [16-17] 16 (08/04 1402) BP: (104-133)/(49-58) 124/49 (08/04 1402) SpO2:  [94 %-98 %] 96 % (08/04 1402)  General: WDWN patient in NAD. Psych:  Appropriate mood and affect. Neuro:  A&O x 3, Moving all extremities, sensation intact to light touch HEENT:  EOMs intact Chest:  Even non-labored respirations Skin:  Dressing C/D/I, no rashes or lesions Extremities: warm/dry, mild edema, erythema or echymosis.  No lymphadenopathy. Pulses: Popliteus 2+ MSK:  ROM: TKE, MMT: able to perform quad set, (-) Homan's    LABS Recent Labs    11/23/18 0858 11/23/18 1637 11/24/18 0513 11/25/18 1126  HGB 11.1* 9.1* 7.5* 6.5*  WBC 6.4 7.2 6.9 7.2  PLT 100* 104* 90* 83*   Recent Labs    11/24/18 0513 11/25/18 1126  NA 136 137  K 5.8* 4.6  CL 106 105  CO2 22 22  BUN 30* 39*  CREATININE 2.58* 2.44*  GLUCOSE 192* 128*   Recent Labs    11/23/18 0858  INR 1.0     Assessment/Plan: 2 Days Post-Op Procedure(s) (LRB): INTRAMEDULLARY (IM) NAIL INTERTROCHANTRIC (Left)  WBAT L LE  Up with therapy DVT ppx: ASA 325mg  bid at D/C.  Instructed patient not to pick up lovenox at his pharmacy.  He and his wife note understanding. Pain control: Rx for hydrocodone has been sent to patient's pharmacy. Disp: D/c home with HHPT.  Mechele Claude PA-C [facility] Office:  [phone]

## 2018-11-25 NOTE — Progress Notes (Signed)
Occupational Therapy Treatment Patient Details Name: [name] MRN: [id number] DOB: 12-12-1928 Today's Date: 11/25/2018    History of present illness Pt is an 85 year old man admitted after falling in his garden, knocked down by his dog. Underwent IM nail of L hip.   OT comments  Pt making progress with functional goals. Pt's wife present during session. Pt and his wife instructed on bathroom safety during ADLs and ADL mobility. Recommend use of 3 in 1 over toilet and reacher at home. OT will continue to follow acutely  Follow Up Recommendations  Home health OT;Supervision/Assistance - 24 hour    Equipment Recommendations  3 in 1 bedside commode;Other (comment)(reacher)    Recommendations for Other Services      Precautions / Restrictions Precautions Precautions: Fall Restrictions Weight Bearing Restrictions: Yes LLE Weight Bearing: Weight bearing as tolerated       Mobility Bed Mobility               General bed mobility comments: Pt in recliner on arrival.  Transfers Overall transfer level: Needs assistance Equipment used: Rolling walker (2 wheeled) Transfers: Sit to/from Stand Sit to Stand: Min guard;Supervision         General transfer comment: Cues for scooting forward to position and [place] placement.    Balance Overall balance assessment: Needs assistance Sitting-balance support: No upper extremity supported;Feet supported Sitting balance-Leahy Scale: Good     Standing balance support: Single extremity supported;Bilateral upper extremity supported;During functional activity Standing balance-Leahy Scale: Fair                             ADL either performed or assessed with clinical judgement   ADL Overall ADL's : Needs assistance/impaired Eating/Feeding: Independent;Sitting   Grooming: Wash/dry hands;Wash/dry face;Standing;Min guard;With caregiver independent assisting       Lower Body Bathing: Maximal assistance;Moderate  assistance;Sit to/from stand;With caregiver independent assisting Lower Body Bathing Details (indicate cue type and reason): simulated     Lower Body Dressing: Maximal assistance;Sit to/from stand;With caregiver independent assisting   Toilet Transfer: Ambulation;RW;BSC;Min guard   Toileting- Water quality scientist and Hygiene: Moderate assistance;Sit to/from stand;With caregiver independent assisting       Functional mobility during ADLs: Rolling walker;Min guard       Vision Patient Visual Report: No change from baseline     Perception     Praxis      Cognition Arousal/Alertness: Awake/alert Behavior During Therapy: WFL for tasks assessed/performed Overall Cognitive Status: Within Functional Limits for tasks assessed                                          Exercises Total Joint Exercises Ankle Circles/Pumps: AROM;Both;20 reps;Supine Quad Sets: AROM;Left;10 reps;Supine Short Arc Quad: AROM;Left;10 reps;Supine Heel Slides: AAROM;Left;10 reps;Supine Hip ABduction/ADduction: AAROM;Left;10 reps;Supine Long Arc Quad: AROM;Left;10 reps;Seated   Shoulder Instructions       General Comments      Pertinent Vitals/ Pain       Pain Assessment: 0-10 Pain Score: 5  Pain Location: L hip with activity Pain Descriptors / Indicators: Sore;Operative site guarding Pain Intervention(s): Monitored during session;Premedicated before session;Repositioned  Home Living  Prior Functioning/Environment              Frequency  Min 2X/week        Progress Toward Goals  OT Goals(current goals can now be found in the care plan section)  Progress towards OT goals: Progressing toward goals  Acute Rehab OT Goals Patient Stated Goal: to return home with wife  Plan Discharge plan remains appropriate    Co-evaluation                 AM-PAC OT "6 Clicks" Daily Activity     Outcome Measure    Help from another person eating meals?: None Help from another person taking care of personal grooming?: A Little Help from another person toileting, which includes using toliet, bedpan, or urinal?: A Lot Help from another person bathing (including washing, rinsing, drying)?: A Lot Help from another person to put on and taking off regular upper body clothing?: A Little Help from another person to put on and taking off regular lower body clothing?: A Lot 6 Click Score: 16    End of Session Equipment Utilized During Treatment: Gait belt;Rolling walker  OT Visit Diagnosis: Unsteadiness on feet (R26.81);Other abnormalities of gait and mobility (R26.89);Pain Pain - Right/Left: Left Pain - part of body: Hip;Leg   Activity Tolerance Patient tolerated treatment well   Patient Left in chair;with call bell/phone within reach;with family/visitor present   Nurse Communication          Time: 2233-6122 OT Time Calculation (min): 27 min  Charges: OT General Charges $OT Visit: 1 Visit OT Treatments $Self Care/Home Management : 8-22 mins $Therapeutic Activity: 8-22 mins     Britt Bottom 11/25/2018, 3:38 PM

## 2018-11-25 NOTE — Progress Notes (Signed)
Nurse changed the surgical site dressing 5 am this morning, it was socked and wet with blood, incision site look great, will continue to monitor, thanks!

## 2018-11-26 DIAGNOSIS — D649 Anemia, unspecified: Secondary | ICD-10-CM

## 2018-11-26 LAB — COMPREHENSIVE METABOLIC PANEL
ALT: 11 U/L (ref 0–44)
AST: 23 U/L (ref 15–41)
Albumin: 2.8 g/dL — ABNORMAL LOW (ref 3.5–5.0)
Alkaline Phosphatase: 46 U/L (ref 38–126)
Anion gap: 6 (ref 5–15)
BUN: 38 mg/dL — ABNORMAL HIGH (ref 8–23)
CO2: 21 mmol/L — ABNORMAL LOW (ref 22–32)
Calcium: 8.3 mg/dL — ABNORMAL LOW (ref 8.9–10.3)
Chloride: 109 mmol/L (ref 98–111)
Creatinine, Ser: 2.36 mg/dL — ABNORMAL HIGH (ref 0.61–1.24)
GFR calc Af Amer: 27 mL/min — ABNORMAL LOW (ref >60)
GFR calc non Af Amer: 24 mL/min — ABNORMAL LOW (ref >60)
Glucose, Bld: 153 mg/dL — ABNORMAL HIGH (ref 70–99)
Potassium: 4.7 mmol/L (ref 3.5–5.1)
Sodium: 136 mmol/L (ref 135–145)
Total Bilirubin: 0.8 mg/dL (ref 0.3–1.2)
Total Protein: 4.7 g/dL — ABNORMAL LOW (ref 6.5–8.1)

## 2018-11-26 LAB — BPAM RBC
Blood Product Expiration Date: 202008112359
Blood Product Expiration Date: 202008112359
ISSUE DATE / TIME: 202008041325
ISSUE DATE / TIME: 202008041617
Unit Type and Rh: 5100
Unit Type and Rh: 5100

## 2018-11-26 LAB — CBC
HCT: 24.1 % — ABNORMAL LOW (ref 39.0–52.0)
Hemoglobin: 8.1 g/dL — ABNORMAL LOW (ref 13.0–17.0)
MCH: 33.9 pg (ref 26.0–34.0)
MCHC: 33.6 g/dL (ref 30.0–36.0)
MCV: 100.8 fL — ABNORMAL HIGH (ref 80.0–100.0)
Platelets: 85 10*3/uL — ABNORMAL LOW (ref 150–400)
RBC: 2.39 MIL/uL — ABNORMAL LOW (ref 4.22–5.81)
RDW: 17.7 % — ABNORMAL HIGH (ref 11.5–15.5)
WBC: 6.5 10*3/uL (ref 4.0–10.5)
nRBC: 0 % (ref 0.0–0.2)

## 2018-11-26 LAB — TYPE AND SCREEN
ABO/RH(D): O POS
Antibody Screen: NEGATIVE
Unit division: 0
Unit division: 0

## 2018-11-26 MED ORDER — FERROUS SULFATE 325 (65 FE) MG PO TBEC: 325.0000 mg | DELAYED_RELEASE_TABLET | Freq: Two times a day (BID) | ORAL | 1 refills | Status: DC

## 2018-11-26 NOTE — Progress Notes (Signed)
Subjective: 3 Days Post-Op Procedure(s) (LRB): INTRAMEDULLARY (IM) NAIL INTERTROCHANTRIC (Left)  Patient reports pain as mild to moderate.  Tolerating POs well.  Admits to flatus.  Denies fever, chills, N/V, CP, SOB.  Worked well with therapy.  Reports that he is ready to go home.  Objective:   VITALS:  Temp:  [97.3 F (36.3 C)-99.3 F (37.4 C)] 99.3 F (37.4 C) (08/05 0748) Pulse Rate:  [55-66] 66 (08/05 0748) Resp:  [15-16] 16 (08/05 0748) BP: (104-143)/(49-94) 123/53 (08/05 0748) SpO2:  [94 %-100 %] 98 % (08/05 0748)  General: WDWN patient in NAD. Psych:  Appropriate mood and affect. Neuro:  A&O x 3, Moving all extremities, sensation intact to light touch HEENT:  EOMs intact Chest:  Even non-labored respirations Skin: Dressing D/I with evidence of dried blood in the dressing centrally, no rashes or lesions Extremities: warm/dry, mild edema, no erythema or echymosis.  No lymphadenopathy. Pulses: Popliteus 2+ MSK:  ROM: TKE on L LE, MMT: able to perform quad set, (-) Homan's    LABS Recent Labs    11/23/18 0858 11/23/18 1637 11/24/18 0513 11/25/18 1126  HGB 11.1* 9.1* 7.5* 6.5*  WBC 6.4 7.2 6.9 7.2  PLT 100* 104* 90* 83*   Recent Labs    11/24/18 0513 11/25/18 1126  NA 136 137  K 5.8* 4.6  CL 106 105  CO2 22 22  BUN 30* 39*  CREATININE 2.58* 2.44*  GLUCOSE 192* 128*   Recent Labs    11/23/18 0858  INR 1.0     Assessment/Plan: 3 Days Post-Op Procedure(s) (LRB): INTRAMEDULLARY (IM) NAIL INTERTROCHANTRIC (Left)  WBAT L LE DVP ppx: ASA 325 mg bid upon D/C.  Patient knows not to fill lovenox Rx that was originally sent to his pharmacy. Pain Rx: norco 5/325mg  Scripts sent to pharmacy. D/C home with HHPT when ready Plan for outpatient post-op visit with Dr. Doran Durand Ortho signing off Please call with any questions/concerns   Mechele Claude PA-C [facility] Office:  [phone]

## 2018-11-26 NOTE — TOC Transition Note (Addendum)
Transition of Care [facility]) - CM/SW Discharge Note   Patient Details  Name: [name] MRN: [id number] Date of Birth: Feb 03, 1929  [facility] of [facility]) CM/SW Contact:  Weston Anna, LCSW Phone Number: 11/26/2018, 2:12 PM   Clinical Narrative:      UPDATE: Patient has been accepted by [facility] for PT/OT.   CSW spoke with patients spouse, Earlie Server, via phone to determine any needs at home. Spouse is agreeable to Home Health coming to their residence but has no preference at this time. CSW sent referral to [facility] to determine if they can accept- waiting on response at this time.   CSW completed referral with [employer] for DME needs- walker and 3n1.   CSW will follow up for Home Health needs.   Final next level of care: Home w Home Health Services Barriers to Discharge: No Barriers Identified   Patient Goals and CMS Choice   CMS Medicare.gov Compare Post Acute Care list provided to:: Patient Represenative (must comment)(Spoke with patients spouse via phone) Choice offered to / list presented to : Spouse  Discharge Placement                       Discharge Plan and Services     Post Acute Care Choice: Durable Medical Equipment, Home Health          DME Arranged: 3-N-1, Gilford Rile DME Agency: [employer] Date DME Agency Contacted: 11/26/18 Time DME Agency Contacted: [phone] Representative spoke with at DME Agency: [place]: PT, OT [place] Agency: (Waiting to hear back if [facility] can accept)        Social Determinants of Health (SDOH) Interventions     Readmission Risk Interventions No flowsheet data found.

## 2018-11-26 NOTE — Care Management Important Message (Signed)
Important Message  Patient Details  Name: [name] MRN: [id number] Date of Birth: [birth year]   Medicare Important Message Given:  Yes     Memory Argue 11/26/2018, 3:39 PM

## 2018-11-26 NOTE — Discharge Summary (Signed)
Physician Discharge Summary  [name] OEV:[id number] DOB: [birth year] DOA: 11/23/2018  PCP: Gerald Ro, DO  Admit date: 11/23/2018 Discharge date: 11/26/2018  Admitted From: Home  Disposition:  Home   Recommendations for Outpatient Follow-up:  1. Follow up with PCP in 1-2 weeks 2. Please obtain BMP in 1-2 weeks to follow up creatinine and CBC in one week to recheck Hgb and platelets 3. Patient will follow up with Orthopedics Dr. Doran Durand as directed     Home Health: Yes  Equipment/Devices: 3-in-1, rolling walker, reacher  Discharge Condition: Fair  CODE STATUS: FULL Diet recommendation: Cardiac  Brief/Interim Summary: [name] is an 85 y.o. M with hx CKD IV baseline 2.0, HTN, and AAA who presented after a mechanical fall, tripped while walking the dog.    In the ER, x-ray showed a comminuted intertrochanteric fracture of the left femoral neck.  Orthopedics were consulted and recommended operative repair.  The hospital service were asked to admit.     PRINCIPAL HOSPITAL DIAGNOSIS: Comminuted intertrochanteric fracture of the left femoral neck    Discharge Diagnoses:  Closed intertrochanteric left hip fracture Status post intramedullary nailing 8/2, uncomplicated Postoperative course complicated by mild AKI on CKD 4 as well as acute blood loss anemia requiring transfusion of 2 units of blood. Aspirin for DVT prophylaxis given thrombocytopenia Norco for pain Follow-up with Dr. Doran Durand as directed   Acute on chronic kidney disease Baseline creatinine around 2, creatinine bumped to 2.6 postoperatively, steadily improved subsequently, creatinine 2.3 mg/dL on the day of discharge. Repeat BMP in 1 week  Hypertension Well-controlled  Atherosclerosis History AAA  Anemia of chronic kidney disease with superimposed acute blood loss anemia Baseline hemoglobin 11.  Hemoglobin 6.5 postoperatively, transfused 2 units, with appropriate posttransfusion increase in hemoglobin.  No  clinical bleeding.  Symptoms resolved. Start iron Follow-up hemoglobin in 1 week with PCP  Thrombocytopenia Platelets slightly down at time of discharge, 85K. Follow-up platelets in 1 week     Discharge Instructions  Discharge Instructions    Diet - low sodium heart healthy   Complete by: As directed    Discharge instructions   Complete by: As directed    From Dr. Loleta Books: You were admitted for a hip fracture This was repaired in the OR successfully by Dr. Doran Durand  To reduce the risk of a blood clot after the surgery, take aspirin 325 mg twice daily for the next month  Call your primary care doctor for a follow up appointment and ask them to check your labs before the appointment either 1 or 2 weeks from now  While you are taking the pain medicine hydrocodone, take a bowel regimen (like Senna and docusate)  For your anemia, take iron (ferrous sulfate 325 mg) twice daily for 1 month then stop  Resume your other home medicines as you took them before.   Increase activity slowly   Complete by: As directed    Weight bearing as tolerated   Complete by: As directed    Laterality: left   Extremity: Lower     Allergies as of 11/26/2018   No Known Allergies     Medication List    TAKE these medications   amLODipine 10 MG tablet Commonly known as: NORVASC Take 10 mg by mouth daily.   aspirin EC 325 MG tablet Take 1 tablet (325 mg total) by mouth 2 (two) times daily.   docusate sodium 100 MG capsule Commonly known as: Colace Take 1 capsule (100 mg total) by  mouth 2 (two) times daily. While taking narcotic pain medicine.   ferrous sulfate 325 (65 FE) MG EC tablet Take 1 tablet (325 mg total) by mouth 2 (two) times daily.   fish oil-omega-3 fatty acids 1000 MG capsule Take 2 g by mouth daily.   furosemide 20 MG tablet Commonly known as: LASIX Take 10 mg by mouth every Monday, Wednesday, and Friday.   HYDROcodone-acetaminophen 5-325 MG tablet Commonly known as:  NORCO/VICODIN Take 1 tablet by mouth every 4 (four) hours as needed for up to 5 days for moderate pain or severe pain.   levothyroxine 150 MCG tablet Commonly known as: SYNTHROID Take 150 mcg by mouth daily before breakfast.   multivitamin tablet Take 1 tablet by mouth daily.   multivitamin-lutein Caps capsule Take 1 capsule by mouth daily.   pantoprazole 40 MG tablet Commonly known as: PROTONIX Take 1 tablet by mouth daily.   senna 8.6 MG Tabs tablet Commonly known as: SENOKOT Take 2 tablets (17.2 mg total) by mouth 2 (two) times daily.            Durable Medical Equipment  (From admission, onward)         Start     Ordered   11/26/18 1239  For home use only DME Walker rolling  [facility])  Once    Question:  Patient needs a walker to treat with the following condition  Answer:  Intertrochanteric fracture of left hip, closed, initial encounter ([place])   11/26/18 1239   11/26/18 1239  DME 3-in-1  Once     11/26/18 1239           Discharge Care Instructions  (From admission, onward)         Start     Ordered   11/24/18 0000  Weight bearing as tolerated    Question Answer Comment  Laterality left   Extremity Lower      11/24/18 1653         Follow-up Information    Wylene Simmer, MD. Schedule an appointment as soon as possible for a visit in 2 weeks.   Specialty: Orthopedic Surgery Contact information: [address] [place] [address] [phone]        Rory Percy D, DO Follow up.   Specialty: Family Medicine Why: Call for an appointment and lab work to check kidneys and blood level in 1-2 weeks         No Known Allergies  Consultations:  Orthopedics   Procedures/Studies: Dg Chest 1 View  Result Date: 11/23/2018 CLINICAL DATA:  Pt states he fell and tripped over his dog onset this morning and is c/o left leg pain. Pt left foot laid out to the side upon arrival for xray. Immobilization device used for AP view due to  pt unable to rotate leg. Pt denies any on body medical injector devices EXAM: CHEST  1 VIEW COMPARISON:  07/03/2018 FINDINGS: Cardiac silhouette is normal in size. No mediastinal or hilar masses. No evidence of adenopathy. Lungs demonstrate prominent bronchovascular markings and areas of stable scarring. No evidence of pneumonia or pulmonary edema. No pleural effusion or pneumothorax. Skeletal structures are grossly intact. IMPRESSION: No acute cardiopulmonary disease. Electronically Signed   By: Lajean Manes M.D.   On: 11/23/2018 10:32   Dg Pelvis 1-2 Views  Result Date: 11/23/2018 CLINICAL DATA:  Pt states he fell and tripped over his dog onset this morning and is c/o left leg pain. Pt left foot laid out to  the side upon arrival for xray. Immobilization device used for AP view due to pt unable to rotate leg. Pt denies any on body medical injector devices EXAM: PELVIS - 1-2 VIEW COMPARISON:  CT, 11/13/2011 FINDINGS: Comminuted intertrochanteric fracture of the proximal left femur. No other fractures.  No bone lesions. Hip joints, SI joints and symphysis pubis are normally aligned. Bones are demineralized. IMPRESSION: 1. Comminuted, intertrochanteric fracture of the proximal left femur. Refer to the left femur radiographs for further details. 2. No other fractures.  No dislocation. Electronically Signed   By: Lajean Manes M.D.   On: 11/23/2018 10:35   Dg C-arm 1-60 Min  Result Date: 11/23/2018 CLINICAL DATA:  Intraoperative imaging for left intertrochanteric fracture ORIF. EXAM: LEFT FEMUR 2 VIEWS; DG C-ARM 61-120 MIN COMPARISON:  Earlier left femur radiographs from today. FINDINGS: Imaging shows placement of an intramedullary rod and compression screw reducing the intertrochanteric fracture into improved alignment. The orthopedic hardware appears well seated. IMPRESSION: Imaging provided for ORIF of a left intertrochanteric a proximal femur fracture. Electronically Signed   By: Lajean Manes M.D.   On:  11/23/2018 15:40   Dg Femur Min 2 Views Left  Result Date: 11/23/2018 CLINICAL DATA:  Intraoperative imaging for left intertrochanteric fracture ORIF. EXAM: LEFT FEMUR 2 VIEWS; DG C-ARM 61-120 MIN COMPARISON:  Earlier left femur radiographs from today. FINDINGS: Imaging shows placement of an intramedullary rod and compression screw reducing the intertrochanteric fracture into improved alignment. The orthopedic hardware appears well seated. IMPRESSION: Imaging provided for ORIF of a left intertrochanteric a proximal femur fracture. Electronically Signed   By: Lajean Manes M.D.   On: 11/23/2018 15:40   Dg Femur Min 2 Views Left  Result Date: 11/23/2018 CLINICAL DATA:  Pt states he fell and tripped over his dog onset this morning and is c/o left leg pain. Pt left foot laid out to the side upon arrival for xray. Immobilization device used for AP view due to pt unable to rotate leg. Pt denies any on body medical injector devices EXAM: LEFT FEMUR 2 VIEWS COMPARISON:  None. FINDINGS: Comminuted intertrochanteric fracture of the proximal left femur with separate fracture components across the lesser and greater trochanters. Primary fracture components are mildly displaced, the distal component displacing 1.3 cm laterally. No other fractures.  The hip and knee joints are normally aligned. No bone lesions. IMPRESSION: 1. Comminuted, mildly displaced, non angulated intertrochanteric fracture of the proximal left femur. No dislocation. Electronically Signed   By: Lajean Manes M.D.   On: 11/23/2018 10:34      Subjective: Feeling well. No chest pain, melena, hematochezia, pain in left incision, no fever, no cough, no dyspnea.  Discharge Exam: Vitals:   11/26/18 0347 11/26/18 0748  BP: (!) 143/59 (!) 123/53  Pulse: 65 66  Resp: 15 16  Temp: 98.5 F (36.9 C) 99.3 F (37.4 C)  SpO2: 96% 98%   Vitals:   11/25/18 1829 11/25/18 2055 11/26/18 0347 11/26/18 0748  BP: (!) 134/94 (!) 140/59 (!) 143/59 (!)  123/53  Pulse: (!) 59 62 65 66  Resp: 16 16 15 16   Temp: (!) 97.3 F (36.3 C) 97.7 F (36.5 C) 98.5 F (36.9 C) 99.3 F (37.4 C)  TempSrc: Oral Oral Oral Oral  SpO2: 100% 94% 96% 98%  Weight:      Height:        General: Pt is alert, awake, not in acute distress Cardiovascular: RRR, nl S1-S2, no murmurs appreciated.   No LE edema.  Respiratory: Normal respiratory rate and rhythm.  CTAB without rales or wheezes. Abdominal: Abdomen soft and non-tender.  No distension or HSM.   Neuro/Psych: Strength symmetric in upper and lower extremities.  Judgment and insight appear mildly impaired.   The results of significant diagnostics from this hospitalization (including imaging, microbiology, ancillary and laboratory) are listed below for reference.     Microbiology: Recent Results (from the past 240 hour(s))  SARS Coronavirus 2 [facility] order, Performed in [facility] hospital lab) Nasopharyngeal Nasopharyngeal Swab     Status: None   Collection Time: 11/23/18  8:50 AM   Specimen: Nasopharyngeal Swab  Result Value Ref Range Status   SARS Coronavirus 2 NEGATIVE NEGATIVE Final    Comment: (NOTE) If result is NEGATIVE SARS-CoV-2 target nucleic acids are NOT DETECTED. The SARS-CoV-2 RNA is generally detectable in upper and lower  respiratory specimens during the acute phase of infection. The lowest  concentration of SARS-CoV-2 viral copies this assay can detect is 250  copies / mL. A negative result does not preclude SARS-CoV-2 infection  and should not be used as the sole basis for treatment or other  patient management decisions.  A negative result may occur with  improper specimen collection / handling, submission of specimen other  than nasopharyngeal swab, presence of viral mutation(s) within the  areas targeted by this assay, and inadequate number of viral copies  (<250 copies / mL). A negative result must be combined with clinical  observations, patient history, and  epidemiological information. If result is POSITIVE SARS-CoV-2 target nucleic acids are DETECTED. The SARS-CoV-2 RNA is generally detectable in upper and lower  respiratory specimens dur ing the acute phase of infection.  Positive  results are indicative of active infection with SARS-CoV-2.  Clinical  correlation with patient history and other diagnostic information is  necessary to determine patient infection status.  Positive results do  not rule out bacterial infection or co-infection with other viruses. If result is PRESUMPTIVE POSTIVE SARS-CoV-2 nucleic acids MAY BE PRESENT.   A presumptive positive result was obtained on the submitted specimen  and confirmed on repeat testing.  While 2019 novel coronavirus  (SARS-CoV-2) nucleic acids may be present in the submitted sample  additional confirmatory testing may be necessary for epidemiological  and / or clinical management purposes  to differentiate between  SARS-CoV-2 and other Sarbecovirus currently known to infect humans.  If clinically indicated additional testing with an alternate test  methodology [phone]) is advised. The SARS-CoV-2 RNA is generally  detectable in upper and lower respiratory sp ecimens during the acute  phase of infection. The expected result is Negative. Fact Sheet for Patients:  StrictlyIdeas.no Fact Sheet for Healthcare Providers: BankingDealers.co.za This test is not yet approved or cleared by the [place] FDA and has been authorized for detection and/or diagnosis of SARS-CoV-2 by FDA under an Emergency Use Authorization (EUA).  This EUA will remain in effect (meaning this test can be used) for the duration of the COVID-19 declaration under Section 564(b)(1) of the Act, 21 U.S.C. section 360bbb-3(b)(1), unless the authorization is terminated or revoked sooner. Performed at [facility], [address], [place], [place] [address]      Labs: BNP (last  3 results) No results for input(s): BNP in the last 8760 hours. Basic Metabolic Panel: Recent Labs  Lab 11/23/18 0858 11/23/18 1637 11/24/18 0513 11/25/18 1126 11/26/18 1018  NA 142  --  136 137 136  K 4.6  --  5.8* 4.6 4.7  CL  110  --  106 105 109  CO2 23  --  22 22 21*  GLUCOSE 143*  --  192* 128* 153*  BUN 29*  --  30* 39* 38*  CREATININE 2.04* 2.18* 2.58* 2.44* 2.36*  CALCIUM 9.6  --  8.1* 8.5* 8.3*   Liver Function Tests: Recent Labs  Lab 11/26/18 1018  AST 23  ALT 11  ALKPHOS 46  BILITOT 0.8  PROT 4.7*  ALBUMIN 2.8*   No results for input(s): LIPASE, AMYLASE in the last 168 hours. No results for input(s): AMMONIA in the last 168 hours. CBC: Recent Labs  Lab 11/23/18 0858 11/23/18 1637 11/24/18 0513 11/25/18 1126 11/26/18 1018  WBC 6.4 7.2 6.9 7.2 6.5  NEUTROABS 4.2  --   --  4.8  --   HGB 11.1* 9.1* 7.5* 6.5* 8.1*  HCT 34.4* 27.2* 22.4* 20.2* 24.1*  MCV 105.8* 106.3* 104.7* 108.0* 100.8*  PLT 100* 104* 90* 83* 85*   Cardiac Enzymes: No results for input(s): CKTOTAL, CKMB, CKMBINDEX, TROPONINI in the last 168 hours. BNP: Invalid input(s): POCBNP CBG: No results for input(s): GLUCAP in the last 168 hours. D-Dimer No results for input(s): DDIMER in the last 72 hours. Hgb A1c No results for input(s): HGBA1C in the last 72 hours. Lipid Profile No results for input(s): CHOL, HDL, LDLCALC, TRIG, CHOLHDL, LDLDIRECT in the last 72 hours. Thyroid function studies No results for input(s): TSH, T4TOTAL, T3FREE, THYROIDAB in the last 72 hours.  Invalid input(s): FREET3 Anemia work up Recent Labs    11/25/18 1326  FERRITIN 209  TIBC 244*  IRON 37*   Urinalysis    Component Value Date/Time   COLORURINE YELLOW 12/14/2011 [place] 12/14/2011 1323   LABSPEC 1.015 12/14/2011 1323   PHURINE 6.0 12/14/2011 1323   GLUCOSEU NEGATIVE 12/14/2011 1323   HGBUR NEGATIVE 12/14/2011 1323   [place] 12/14/2011 1323   KETONESUR  NEGATIVE 12/14/2011 1323   PROTEINUR 30 (A) 12/14/2011 1323   UROBILINOGEN 1.0 12/14/2011 1323   NITRITE NEGATIVE 12/14/2011 1323   LEUKOCYTESUR NEGATIVE 12/14/2011 1323   Sepsis Labs Invalid input(s): PROCALCITONIN,  WBC,  LACTICIDVEN Microbiology Recent Results (from the past 240 hour(s))  SARS Coronavirus 2 [facility] order, Performed in [facility] hospital lab) Nasopharyngeal Nasopharyngeal Swab     Status: None   Collection Time: 11/23/18  8:50 AM   Specimen: Nasopharyngeal Swab  Result Value Ref Range Status   SARS Coronavirus 2 NEGATIVE NEGATIVE Final    Comment: (NOTE) If result is NEGATIVE SARS-CoV-2 target nucleic acids are NOT DETECTED. The SARS-CoV-2 RNA is generally detectable in upper and lower  respiratory specimens during the acute phase of infection. The lowest  concentration of SARS-CoV-2 viral copies this assay can detect is 250  copies / mL. A negative result does not preclude SARS-CoV-2 infection  and should not be used as the sole basis for treatment or other  patient management decisions.  A negative result may occur with  improper specimen collection / handling, submission of specimen other  than nasopharyngeal swab, presence of viral mutation(s) within the  areas targeted by this assay, and inadequate number of viral copies  (<250 copies / mL). A negative result must be combined with clinical  observations, patient history, and epidemiological information. If result is POSITIVE SARS-CoV-2 target nucleic acids are DETECTED. The SARS-CoV-2 RNA is generally detectable in upper and lower  respiratory specimens dur ing the acute phase of infection.  Positive  results are indicative  of active infection with SARS-CoV-2.  Clinical  correlation with patient history and other diagnostic information is  necessary to determine patient infection status.  Positive results do  not rule out bacterial infection or co-infection with other viruses. If result is PRESUMPTIVE  POSTIVE SARS-CoV-2 nucleic acids MAY BE PRESENT.   A presumptive positive result was obtained on the submitted specimen  and confirmed on repeat testing.  While 2019 novel coronavirus  (SARS-CoV-2) nucleic acids may be present in the submitted sample  additional confirmatory testing may be necessary for epidemiological  and / or clinical management purposes  to differentiate between  SARS-CoV-2 and other Sarbecovirus currently known to infect humans.  If clinically indicated additional testing with an alternate test  methodology [phone]) is advised. The SARS-CoV-2 RNA is generally  detectable in upper and lower respiratory sp ecimens during the acute  phase of infection. The expected result is Negative. Fact Sheet for Patients:  StrictlyIdeas.no Fact Sheet for Healthcare Providers: BankingDealers.co.za This test is not yet approved or cleared by the [place] FDA and has been authorized for detection and/or diagnosis of SARS-CoV-2 by FDA under an Emergency Use Authorization (EUA).  This EUA will remain in effect (meaning this test can be used) for the duration of the COVID-19 declaration under Section 564(b)(1) of the Act, 21 U.S.C. section 360bbb-3(b)(1), unless the authorization is terminated or revoked sooner. Performed at [facility], [address], [place], [place] [address]      Time coordinating discharge: 25 minutes      SIGNED:   Edwin Dada, MD  [facility] 11/26/2018, 12:57 PM

## 2018-11-26 NOTE — Progress Notes (Signed)
Physical Therapy Treatment Patient Details Name: [name] MRN: [id number] DOB: [birth year] Today's Date: 11/26/2018    History of Present Illness Pt is an 85 year old man admitted after falling in his garden, knocked down by his dog. Underwent IM nail of L hip.    PT Comments    Pt performed gt training and functional mobility with min to min guard assistance.  Pt performed LE HEP in supine.  Pt remains limited due to pain which requires increased time to complete activity.  Plan for d/c home today with support from his son and spouse.  Pt continues to require HHPT at d/c.     Follow Up Recommendations  Home health PT     Equipment Recommendations  Rolling walker with 5" wheels;3in1 (PT)    Recommendations for Other Services       Precautions / Restrictions Precautions Precautions: Fall Restrictions Weight Bearing Restrictions: Yes LLE Weight Bearing: Weight bearing as tolerated    Mobility  Bed Mobility Overal bed mobility: Needs Assistance Bed Mobility: Supine to Sit     Supine to sit: Min assist     General bed mobility comments: Pt required min assistance to scoot to edge of bed and elevate trunk into sitting.  Min assistance to scoot hips to edge of bed.  Transfers Overall transfer level: Needs assistance Equipment used: Rolling walker (2 wheeled) Transfers: Sit to/from Stand Sit to Stand: Min guard         General transfer comment: Cues for scooting forward to position and Craker placement.  Ambulation/Gait Ambulation/Gait assistance: Min guard Gait Distance (Feet): 24 Feet Assistive device: Rolling walker (2 wheeled) Gait Pattern/deviations: Step-to pattern;Decreased step length - right;Decreased stride length;Decreased stance time - left;Antalgic Gait velocity: decreased   General Gait Details: Heavy UE use,  Cues for weight bearing L to soften landing on R side.   Stairs             Wheelchair Mobility    Modified Rankin (Stroke Patients  Only)       Balance   Sitting-balance support: No upper extremity supported;Feet supported Sitting balance-Leahy Scale: Good       Standing balance-Leahy Scale: Fair                              Cognition Arousal/Alertness: Awake/alert Behavior During Therapy: WFL for tasks assessed/performed Overall Cognitive Status: Within Functional Limits for tasks assessed                                        Exercises Total Joint Exercises Ankle Circles/Pumps: AROM;Both;20 reps;Supine Quad Sets: AROM;Left;10 reps;Supine Short Arc Quad: AROM;Left;10 reps;Supine Heel Slides: AAROM;Left;10 reps;Supine Hip ABduction/ADduction: AAROM;Left;10 reps;Supine    General Comments        Pertinent Vitals/Pain Pain Assessment: 0-10 Pain Score: 5  Faces Pain Scale: Hurts little more Pain Location: L hip with activity Pain Descriptors / Indicators: Sore;Operative site guarding Pain Intervention(s): Monitored during session;Repositioned    Home Living                      Prior Function            PT Goals (current goals can now be found in the care plan section) Acute Rehab PT Goals Patient Stated Goal: to return home with wife Potential to Achieve  Goals: Good    Frequency    Min 5X/week      PT Plan Current plan remains appropriate    Co-evaluation              AM-PAC PT "6 Clicks" Mobility   Outcome Measure  Help needed turning from your back to your side while in a flat bed without using bedrails?: A Little Help needed moving from lying on your back to sitting on the side of a flat bed without using bedrails?: A Little Help needed moving to and from a bed to a chair (including a wheelchair)?: A Little Help needed standing up from a chair using your arms (e.g., wheelchair or bedside chair)?: A Little Help needed to walk in hospital room?: A Little Help needed climbing 3-5 steps with a railing? : A Little 6 Click Score:  18    End of Session Equipment Utilized During Treatment: Gait belt Activity Tolerance: Patient tolerated treatment well Patient left: in chair;with call bell/phone within reach;with family/visitor present Nurse Communication: Mobility status PT Visit Diagnosis: Muscle weakness (generalized) (M62.81);Difficulty in walking, not elsewhere classified (R26.2);History of falling (Z91.81);Pain Pain - Right/Left: Left Pain - part of body: Leg     Time: 1330-1350 PT Time Calculation (min) (ACUTE ONLY): 20 min  Charges:  $Gait Training: 8-22 mins                     Governor Rooks, PTA Acute Rehabilitation Services Pager [phone] Office [phone]     Karyss Frese Eli Hose 11/26/2018, 3:20 PM

## 2018-11-28 DIAGNOSIS — D631 Anemia in chronic kidney disease: Secondary | ICD-10-CM | POA: Diagnosis not present

## 2018-11-28 DIAGNOSIS — S72112D Displaced fracture of greater trochanter of left femur, subsequent encounter for closed fracture with routine healing: Secondary | ICD-10-CM | POA: Diagnosis not present

## 2018-11-28 DIAGNOSIS — N184 Chronic kidney disease, stage 4 (severe): Secondary | ICD-10-CM | POA: Diagnosis not present

## 2018-11-28 DIAGNOSIS — D62 Acute posthemorrhagic anemia: Secondary | ICD-10-CM | POA: Diagnosis not present

## 2018-11-28 DIAGNOSIS — S72122D Displaced fracture of lesser trochanter of left femur, subsequent encounter for closed fracture with routine healing: Secondary | ICD-10-CM | POA: Diagnosis not present

## 2018-11-28 DIAGNOSIS — I7090 Unspecified atherosclerosis: Secondary | ICD-10-CM | POA: Diagnosis not present

## 2018-11-28 DIAGNOSIS — I1310 Hypertensive heart and chronic kidney disease without heart failure, with stage 1 through stage 4 chronic kidney disease, or unspecified chronic kidney disease: Secondary | ICD-10-CM | POA: Diagnosis not present

## 2018-11-28 DIAGNOSIS — I714 Abdominal aortic aneurysm, without rupture: Secondary | ICD-10-CM | POA: Diagnosis not present

## 2018-11-28 DIAGNOSIS — S72142D Displaced intertrochanteric fracture of left femur, subsequent encounter for closed fracture with routine healing: Secondary | ICD-10-CM | POA: Diagnosis not present

## 2018-12-01 DIAGNOSIS — I7090 Unspecified atherosclerosis: Secondary | ICD-10-CM | POA: Diagnosis not present

## 2018-12-01 DIAGNOSIS — D62 Acute posthemorrhagic anemia: Secondary | ICD-10-CM | POA: Diagnosis not present

## 2018-12-01 DIAGNOSIS — S72122D Displaced fracture of lesser trochanter of left femur, subsequent encounter for closed fracture with routine healing: Secondary | ICD-10-CM | POA: Diagnosis not present

## 2018-12-01 DIAGNOSIS — N184 Chronic kidney disease, stage 4 (severe): Secondary | ICD-10-CM | POA: Diagnosis not present

## 2018-12-01 DIAGNOSIS — D631 Anemia in chronic kidney disease: Secondary | ICD-10-CM | POA: Diagnosis not present

## 2018-12-01 DIAGNOSIS — I1310 Hypertensive heart and chronic kidney disease without heart failure, with stage 1 through stage 4 chronic kidney disease, or unspecified chronic kidney disease: Secondary | ICD-10-CM | POA: Diagnosis not present

## 2018-12-01 DIAGNOSIS — S72112D Displaced fracture of greater trochanter of left femur, subsequent encounter for closed fracture with routine healing: Secondary | ICD-10-CM | POA: Diagnosis not present

## 2018-12-01 DIAGNOSIS — I714 Abdominal aortic aneurysm, without rupture: Secondary | ICD-10-CM | POA: Diagnosis not present

## 2018-12-01 DIAGNOSIS — S72142D Displaced intertrochanteric fracture of left femur, subsequent encounter for closed fracture with routine healing: Secondary | ICD-10-CM | POA: Diagnosis not present

## 2018-12-03 DIAGNOSIS — D631 Anemia in chronic kidney disease: Secondary | ICD-10-CM | POA: Diagnosis not present

## 2018-12-03 DIAGNOSIS — I7090 Unspecified atherosclerosis: Secondary | ICD-10-CM | POA: Diagnosis not present

## 2018-12-03 DIAGNOSIS — N184 Chronic kidney disease, stage 4 (severe): Secondary | ICD-10-CM | POA: Diagnosis not present

## 2018-12-03 DIAGNOSIS — I1310 Hypertensive heart and chronic kidney disease without heart failure, with stage 1 through stage 4 chronic kidney disease, or unspecified chronic kidney disease: Secondary | ICD-10-CM | POA: Diagnosis not present

## 2018-12-03 DIAGNOSIS — S72122D Displaced fracture of lesser trochanter of left femur, subsequent encounter for closed fracture with routine healing: Secondary | ICD-10-CM | POA: Diagnosis not present

## 2018-12-03 DIAGNOSIS — S72142D Displaced intertrochanteric fracture of left femur, subsequent encounter for closed fracture with routine healing: Secondary | ICD-10-CM | POA: Diagnosis not present

## 2018-12-03 DIAGNOSIS — I714 Abdominal aortic aneurysm, without rupture: Secondary | ICD-10-CM | POA: Diagnosis not present

## 2018-12-03 DIAGNOSIS — S72112D Displaced fracture of greater trochanter of left femur, subsequent encounter for closed fracture with routine healing: Secondary | ICD-10-CM | POA: Diagnosis not present

## 2018-12-03 DIAGNOSIS — D62 Acute posthemorrhagic anemia: Secondary | ICD-10-CM | POA: Diagnosis not present

## 2018-12-05 DIAGNOSIS — D631 Anemia in chronic kidney disease: Secondary | ICD-10-CM | POA: Diagnosis not present

## 2018-12-05 DIAGNOSIS — I7090 Unspecified atherosclerosis: Secondary | ICD-10-CM | POA: Diagnosis not present

## 2018-12-05 DIAGNOSIS — S72112D Displaced fracture of greater trochanter of left femur, subsequent encounter for closed fracture with routine healing: Secondary | ICD-10-CM | POA: Diagnosis not present

## 2018-12-05 DIAGNOSIS — D62 Acute posthemorrhagic anemia: Secondary | ICD-10-CM | POA: Diagnosis not present

## 2018-12-05 DIAGNOSIS — I1310 Hypertensive heart and chronic kidney disease without heart failure, with stage 1 through stage 4 chronic kidney disease, or unspecified chronic kidney disease: Secondary | ICD-10-CM | POA: Diagnosis not present

## 2018-12-05 DIAGNOSIS — N184 Chronic kidney disease, stage 4 (severe): Secondary | ICD-10-CM | POA: Diagnosis not present

## 2018-12-05 DIAGNOSIS — S72142D Displaced intertrochanteric fracture of left femur, subsequent encounter for closed fracture with routine healing: Secondary | ICD-10-CM | POA: Diagnosis not present

## 2018-12-05 DIAGNOSIS — S72122D Displaced fracture of lesser trochanter of left femur, subsequent encounter for closed fracture with routine healing: Secondary | ICD-10-CM | POA: Diagnosis not present

## 2018-12-05 DIAGNOSIS — I714 Abdominal aortic aneurysm, without rupture: Secondary | ICD-10-CM | POA: Diagnosis not present

## 2018-12-10 DIAGNOSIS — I714 Abdominal aortic aneurysm, without rupture: Secondary | ICD-10-CM | POA: Diagnosis not present

## 2018-12-10 DIAGNOSIS — D62 Acute posthemorrhagic anemia: Secondary | ICD-10-CM | POA: Diagnosis not present

## 2018-12-10 DIAGNOSIS — I1310 Hypertensive heart and chronic kidney disease without heart failure, with stage 1 through stage 4 chronic kidney disease, or unspecified chronic kidney disease: Secondary | ICD-10-CM | POA: Diagnosis not present

## 2018-12-10 DIAGNOSIS — D631 Anemia in chronic kidney disease: Secondary | ICD-10-CM | POA: Diagnosis not present

## 2018-12-10 DIAGNOSIS — E559 Vitamin D deficiency, unspecified: Secondary | ICD-10-CM | POA: Diagnosis not present

## 2018-12-10 DIAGNOSIS — I7090 Unspecified atherosclerosis: Secondary | ICD-10-CM | POA: Diagnosis not present

## 2018-12-10 DIAGNOSIS — S72142D Displaced intertrochanteric fracture of left femur, subsequent encounter for closed fracture with routine healing: Secondary | ICD-10-CM | POA: Diagnosis not present

## 2018-12-10 DIAGNOSIS — N184 Chronic kidney disease, stage 4 (severe): Secondary | ICD-10-CM | POA: Diagnosis not present

## 2018-12-10 DIAGNOSIS — S72122D Displaced fracture of lesser trochanter of left femur, subsequent encounter for closed fracture with routine healing: Secondary | ICD-10-CM | POA: Diagnosis not present

## 2018-12-10 DIAGNOSIS — N189 Chronic kidney disease, unspecified: Secondary | ICD-10-CM | POA: Diagnosis not present

## 2018-12-10 DIAGNOSIS — S72112D Displaced fracture of greater trochanter of left femur, subsequent encounter for closed fracture with routine healing: Secondary | ICD-10-CM | POA: Diagnosis not present

## 2018-12-10 DIAGNOSIS — Z23 Encounter for immunization: Secondary | ICD-10-CM | POA: Diagnosis not present

## 2018-12-12 DIAGNOSIS — D631 Anemia in chronic kidney disease: Secondary | ICD-10-CM | POA: Diagnosis not present

## 2018-12-12 DIAGNOSIS — I7090 Unspecified atherosclerosis: Secondary | ICD-10-CM | POA: Diagnosis not present

## 2018-12-12 DIAGNOSIS — N184 Chronic kidney disease, stage 4 (severe): Secondary | ICD-10-CM | POA: Diagnosis not present

## 2018-12-12 DIAGNOSIS — S72112D Displaced fracture of greater trochanter of left femur, subsequent encounter for closed fracture with routine healing: Secondary | ICD-10-CM | POA: Diagnosis not present

## 2018-12-12 DIAGNOSIS — I714 Abdominal aortic aneurysm, without rupture: Secondary | ICD-10-CM | POA: Diagnosis not present

## 2018-12-12 DIAGNOSIS — S72142D Displaced intertrochanteric fracture of left femur, subsequent encounter for closed fracture with routine healing: Secondary | ICD-10-CM | POA: Diagnosis not present

## 2018-12-12 DIAGNOSIS — S72122D Displaced fracture of lesser trochanter of left femur, subsequent encounter for closed fracture with routine healing: Secondary | ICD-10-CM | POA: Diagnosis not present

## 2018-12-12 DIAGNOSIS — D62 Acute posthemorrhagic anemia: Secondary | ICD-10-CM | POA: Diagnosis not present

## 2018-12-12 DIAGNOSIS — I1310 Hypertensive heart and chronic kidney disease without heart failure, with stage 1 through stage 4 chronic kidney disease, or unspecified chronic kidney disease: Secondary | ICD-10-CM | POA: Diagnosis not present

## 2018-12-15 DIAGNOSIS — D631 Anemia in chronic kidney disease: Secondary | ICD-10-CM | POA: Diagnosis not present

## 2018-12-15 DIAGNOSIS — N184 Chronic kidney disease, stage 4 (severe): Secondary | ICD-10-CM | POA: Diagnosis not present

## 2018-12-15 DIAGNOSIS — S72142D Displaced intertrochanteric fracture of left femur, subsequent encounter for closed fracture with routine healing: Secondary | ICD-10-CM | POA: Diagnosis not present

## 2018-12-15 DIAGNOSIS — S72112D Displaced fracture of greater trochanter of left femur, subsequent encounter for closed fracture with routine healing: Secondary | ICD-10-CM | POA: Diagnosis not present

## 2018-12-15 DIAGNOSIS — I714 Abdominal aortic aneurysm, without rupture: Secondary | ICD-10-CM | POA: Diagnosis not present

## 2018-12-15 DIAGNOSIS — S72122D Displaced fracture of lesser trochanter of left femur, subsequent encounter for closed fracture with routine healing: Secondary | ICD-10-CM | POA: Diagnosis not present

## 2018-12-15 DIAGNOSIS — I7090 Unspecified atherosclerosis: Secondary | ICD-10-CM | POA: Diagnosis not present

## 2018-12-15 DIAGNOSIS — I1310 Hypertensive heart and chronic kidney disease without heart failure, with stage 1 through stage 4 chronic kidney disease, or unspecified chronic kidney disease: Secondary | ICD-10-CM | POA: Diagnosis not present

## 2018-12-15 DIAGNOSIS — D62 Acute posthemorrhagic anemia: Secondary | ICD-10-CM | POA: Diagnosis not present

## 2018-12-16 DIAGNOSIS — S72122D Displaced fracture of lesser trochanter of left femur, subsequent encounter for closed fracture with routine healing: Secondary | ICD-10-CM | POA: Diagnosis not present

## 2018-12-16 DIAGNOSIS — I714 Abdominal aortic aneurysm, without rupture: Secondary | ICD-10-CM | POA: Diagnosis not present

## 2018-12-16 DIAGNOSIS — S72112D Displaced fracture of greater trochanter of left femur, subsequent encounter for closed fracture with routine healing: Secondary | ICD-10-CM | POA: Diagnosis not present

## 2018-12-16 DIAGNOSIS — I7090 Unspecified atherosclerosis: Secondary | ICD-10-CM | POA: Diagnosis not present

## 2018-12-16 DIAGNOSIS — I1310 Hypertensive heart and chronic kidney disease without heart failure, with stage 1 through stage 4 chronic kidney disease, or unspecified chronic kidney disease: Secondary | ICD-10-CM | POA: Diagnosis not present

## 2018-12-16 DIAGNOSIS — D631 Anemia in chronic kidney disease: Secondary | ICD-10-CM | POA: Diagnosis not present

## 2018-12-16 DIAGNOSIS — S72142D Displaced intertrochanteric fracture of left femur, subsequent encounter for closed fracture with routine healing: Secondary | ICD-10-CM | POA: Diagnosis not present

## 2018-12-16 DIAGNOSIS — D62 Acute posthemorrhagic anemia: Secondary | ICD-10-CM | POA: Diagnosis not present

## 2018-12-16 DIAGNOSIS — N184 Chronic kidney disease, stage 4 (severe): Secondary | ICD-10-CM | POA: Diagnosis not present

## 2018-12-23 DIAGNOSIS — I7090 Unspecified atherosclerosis: Secondary | ICD-10-CM | POA: Diagnosis not present

## 2018-12-23 DIAGNOSIS — S72112D Displaced fracture of greater trochanter of left femur, subsequent encounter for closed fracture with routine healing: Secondary | ICD-10-CM | POA: Diagnosis not present

## 2018-12-23 DIAGNOSIS — I1310 Hypertensive heart and chronic kidney disease without heart failure, with stage 1 through stage 4 chronic kidney disease, or unspecified chronic kidney disease: Secondary | ICD-10-CM | POA: Diagnosis not present

## 2018-12-23 DIAGNOSIS — S72122D Displaced fracture of lesser trochanter of left femur, subsequent encounter for closed fracture with routine healing: Secondary | ICD-10-CM | POA: Diagnosis not present

## 2018-12-23 DIAGNOSIS — D62 Acute posthemorrhagic anemia: Secondary | ICD-10-CM | POA: Diagnosis not present

## 2018-12-23 DIAGNOSIS — I509 Heart failure, unspecified: Secondary | ICD-10-CM | POA: Diagnosis not present

## 2018-12-23 DIAGNOSIS — D631 Anemia in chronic kidney disease: Secondary | ICD-10-CM | POA: Diagnosis not present

## 2018-12-23 DIAGNOSIS — N183 Chronic kidney disease, stage 3 (moderate): Secondary | ICD-10-CM | POA: Diagnosis not present

## 2018-12-23 DIAGNOSIS — S72142D Displaced intertrochanteric fracture of left femur, subsequent encounter for closed fracture with routine healing: Secondary | ICD-10-CM | POA: Diagnosis not present

## 2018-12-23 DIAGNOSIS — D649 Anemia, unspecified: Secondary | ICD-10-CM | POA: Diagnosis not present

## 2018-12-23 DIAGNOSIS — R809 Proteinuria, unspecified: Secondary | ICD-10-CM | POA: Diagnosis not present

## 2018-12-23 DIAGNOSIS — I714 Abdominal aortic aneurysm, without rupture: Secondary | ICD-10-CM | POA: Diagnosis not present

## 2018-12-23 DIAGNOSIS — N184 Chronic kidney disease, stage 4 (severe): Secondary | ICD-10-CM | POA: Diagnosis not present

## 2018-12-30 DIAGNOSIS — D631 Anemia in chronic kidney disease: Secondary | ICD-10-CM | POA: Diagnosis not present

## 2018-12-30 DIAGNOSIS — I714 Abdominal aortic aneurysm, without rupture: Secondary | ICD-10-CM | POA: Diagnosis not present

## 2018-12-30 DIAGNOSIS — I7090 Unspecified atherosclerosis: Secondary | ICD-10-CM | POA: Diagnosis not present

## 2018-12-30 DIAGNOSIS — S72142D Displaced intertrochanteric fracture of left femur, subsequent encounter for closed fracture with routine healing: Secondary | ICD-10-CM | POA: Diagnosis not present

## 2018-12-30 DIAGNOSIS — S72122D Displaced fracture of lesser trochanter of left femur, subsequent encounter for closed fracture with routine healing: Secondary | ICD-10-CM | POA: Diagnosis not present

## 2018-12-30 DIAGNOSIS — N184 Chronic kidney disease, stage 4 (severe): Secondary | ICD-10-CM | POA: Diagnosis not present

## 2018-12-30 DIAGNOSIS — S72112D Displaced fracture of greater trochanter of left femur, subsequent encounter for closed fracture with routine healing: Secondary | ICD-10-CM | POA: Diagnosis not present

## 2018-12-30 DIAGNOSIS — D62 Acute posthemorrhagic anemia: Secondary | ICD-10-CM | POA: Diagnosis not present

## 2018-12-30 DIAGNOSIS — I1310 Hypertensive heart and chronic kidney disease without heart failure, with stage 1 through stage 4 chronic kidney disease, or unspecified chronic kidney disease: Secondary | ICD-10-CM | POA: Diagnosis not present

## 2019-01-05 DIAGNOSIS — Z5189 Encounter for other specified aftercare: Secondary | ICD-10-CM | POA: Diagnosis not present

## 2019-01-05 DIAGNOSIS — S72142D Displaced intertrochanteric fracture of left femur, subsequent encounter for closed fracture with routine healing: Secondary | ICD-10-CM | POA: Diagnosis not present

## 2019-01-05 DIAGNOSIS — M25552 Pain in left hip: Secondary | ICD-10-CM | POA: Diagnosis not present

## 2019-01-14 DIAGNOSIS — I7090 Unspecified atherosclerosis: Secondary | ICD-10-CM | POA: Diagnosis not present

## 2019-01-14 DIAGNOSIS — I714 Abdominal aortic aneurysm, without rupture: Secondary | ICD-10-CM | POA: Diagnosis not present

## 2019-01-14 DIAGNOSIS — S72122D Displaced fracture of lesser trochanter of left femur, subsequent encounter for closed fracture with routine healing: Secondary | ICD-10-CM | POA: Diagnosis not present

## 2019-01-14 DIAGNOSIS — S72112D Displaced fracture of greater trochanter of left femur, subsequent encounter for closed fracture with routine healing: Secondary | ICD-10-CM | POA: Diagnosis not present

## 2019-01-14 DIAGNOSIS — N184 Chronic kidney disease, stage 4 (severe): Secondary | ICD-10-CM | POA: Diagnosis not present

## 2019-01-14 DIAGNOSIS — I1310 Hypertensive heart and chronic kidney disease without heart failure, with stage 1 through stage 4 chronic kidney disease, or unspecified chronic kidney disease: Secondary | ICD-10-CM | POA: Diagnosis not present

## 2019-01-14 DIAGNOSIS — S72142D Displaced intertrochanteric fracture of left femur, subsequent encounter for closed fracture with routine healing: Secondary | ICD-10-CM | POA: Diagnosis not present

## 2019-01-14 DIAGNOSIS — D631 Anemia in chronic kidney disease: Secondary | ICD-10-CM | POA: Diagnosis not present

## 2019-01-14 DIAGNOSIS — D62 Acute posthemorrhagic anemia: Secondary | ICD-10-CM | POA: Diagnosis not present

## 2019-01-21 DIAGNOSIS — I10 Essential (primary) hypertension: Secondary | ICD-10-CM | POA: Diagnosis not present

## 2019-01-21 DIAGNOSIS — E039 Hypothyroidism, unspecified: Secondary | ICD-10-CM | POA: Diagnosis not present

## 2019-01-23 DIAGNOSIS — I7090 Unspecified atherosclerosis: Secondary | ICD-10-CM | POA: Diagnosis not present

## 2019-01-23 DIAGNOSIS — I1310 Hypertensive heart and chronic kidney disease without heart failure, with stage 1 through stage 4 chronic kidney disease, or unspecified chronic kidney disease: Secondary | ICD-10-CM | POA: Diagnosis not present

## 2019-01-23 DIAGNOSIS — D62 Acute posthemorrhagic anemia: Secondary | ICD-10-CM | POA: Diagnosis not present

## 2019-01-23 DIAGNOSIS — N184 Chronic kidney disease, stage 4 (severe): Secondary | ICD-10-CM | POA: Diagnosis not present

## 2019-01-23 DIAGNOSIS — S72112D Displaced fracture of greater trochanter of left femur, subsequent encounter for closed fracture with routine healing: Secondary | ICD-10-CM | POA: Diagnosis not present

## 2019-01-23 DIAGNOSIS — S72142D Displaced intertrochanteric fracture of left femur, subsequent encounter for closed fracture with routine healing: Secondary | ICD-10-CM | POA: Diagnosis not present

## 2019-01-23 DIAGNOSIS — S72122D Displaced fracture of lesser trochanter of left femur, subsequent encounter for closed fracture with routine healing: Secondary | ICD-10-CM | POA: Diagnosis not present

## 2019-01-23 DIAGNOSIS — D631 Anemia in chronic kidney disease: Secondary | ICD-10-CM | POA: Diagnosis not present

## 2019-01-23 DIAGNOSIS — I714 Abdominal aortic aneurysm, without rupture: Secondary | ICD-10-CM | POA: Diagnosis not present

## 2019-01-29 DIAGNOSIS — I714 Abdominal aortic aneurysm, without rupture: Secondary | ICD-10-CM | POA: Diagnosis not present

## 2019-01-29 DIAGNOSIS — S72142D Displaced intertrochanteric fracture of left femur, subsequent encounter for closed fracture with routine healing: Secondary | ICD-10-CM | POA: Diagnosis not present

## 2019-01-29 DIAGNOSIS — S72122D Displaced fracture of lesser trochanter of left femur, subsequent encounter for closed fracture with routine healing: Secondary | ICD-10-CM | POA: Diagnosis not present

## 2019-01-29 DIAGNOSIS — D631 Anemia in chronic kidney disease: Secondary | ICD-10-CM | POA: Diagnosis not present

## 2019-01-29 DIAGNOSIS — I1310 Hypertensive heart and chronic kidney disease without heart failure, with stage 1 through stage 4 chronic kidney disease, or unspecified chronic kidney disease: Secondary | ICD-10-CM | POA: Diagnosis not present

## 2019-01-29 DIAGNOSIS — D62 Acute posthemorrhagic anemia: Secondary | ICD-10-CM | POA: Diagnosis not present

## 2019-01-29 DIAGNOSIS — S72112D Displaced fracture of greater trochanter of left femur, subsequent encounter for closed fracture with routine healing: Secondary | ICD-10-CM | POA: Diagnosis not present

## 2019-01-29 DIAGNOSIS — N184 Chronic kidney disease, stage 4 (severe): Secondary | ICD-10-CM | POA: Diagnosis not present

## 2019-01-29 DIAGNOSIS — I7090 Unspecified atherosclerosis: Secondary | ICD-10-CM | POA: Diagnosis not present

## 2019-02-02 DIAGNOSIS — I714 Abdominal aortic aneurysm, without rupture: Secondary | ICD-10-CM | POA: Diagnosis not present

## 2019-02-02 DIAGNOSIS — I1310 Hypertensive heart and chronic kidney disease without heart failure, with stage 1 through stage 4 chronic kidney disease, or unspecified chronic kidney disease: Secondary | ICD-10-CM | POA: Diagnosis not present

## 2019-02-02 DIAGNOSIS — S72122D Displaced fracture of lesser trochanter of left femur, subsequent encounter for closed fracture with routine healing: Secondary | ICD-10-CM | POA: Diagnosis not present

## 2019-02-02 DIAGNOSIS — N184 Chronic kidney disease, stage 4 (severe): Secondary | ICD-10-CM | POA: Diagnosis not present

## 2019-02-02 DIAGNOSIS — Z4789 Encounter for other orthopedic aftercare: Secondary | ICD-10-CM | POA: Diagnosis not present

## 2019-02-02 DIAGNOSIS — S72112D Displaced fracture of greater trochanter of left femur, subsequent encounter for closed fracture with routine healing: Secondary | ICD-10-CM | POA: Diagnosis not present

## 2019-02-02 DIAGNOSIS — I7090 Unspecified atherosclerosis: Secondary | ICD-10-CM | POA: Diagnosis not present

## 2019-02-02 DIAGNOSIS — Z5189 Encounter for other specified aftercare: Secondary | ICD-10-CM | POA: Diagnosis not present

## 2019-02-02 DIAGNOSIS — D631 Anemia in chronic kidney disease: Secondary | ICD-10-CM | POA: Diagnosis not present

## 2019-02-02 DIAGNOSIS — D62 Acute posthemorrhagic anemia: Secondary | ICD-10-CM | POA: Diagnosis not present

## 2019-02-02 DIAGNOSIS — S72142D Displaced intertrochanteric fracture of left femur, subsequent encounter for closed fracture with routine healing: Secondary | ICD-10-CM | POA: Diagnosis not present

## 2019-02-11 DIAGNOSIS — S72112D Displaced fracture of greater trochanter of left femur, subsequent encounter for closed fracture with routine healing: Secondary | ICD-10-CM | POA: Diagnosis not present

## 2019-02-11 DIAGNOSIS — D62 Acute posthemorrhagic anemia: Secondary | ICD-10-CM | POA: Diagnosis not present

## 2019-02-11 DIAGNOSIS — I1310 Hypertensive heart and chronic kidney disease without heart failure, with stage 1 through stage 4 chronic kidney disease, or unspecified chronic kidney disease: Secondary | ICD-10-CM | POA: Diagnosis not present

## 2019-02-11 DIAGNOSIS — N184 Chronic kidney disease, stage 4 (severe): Secondary | ICD-10-CM | POA: Diagnosis not present

## 2019-02-11 DIAGNOSIS — I7090 Unspecified atherosclerosis: Secondary | ICD-10-CM | POA: Diagnosis not present

## 2019-02-11 DIAGNOSIS — S72142D Displaced intertrochanteric fracture of left femur, subsequent encounter for closed fracture with routine healing: Secondary | ICD-10-CM | POA: Diagnosis not present

## 2019-02-11 DIAGNOSIS — D631 Anemia in chronic kidney disease: Secondary | ICD-10-CM | POA: Diagnosis not present

## 2019-02-11 DIAGNOSIS — I714 Abdominal aortic aneurysm, without rupture: Secondary | ICD-10-CM | POA: Diagnosis not present

## 2019-02-11 DIAGNOSIS — S72122D Displaced fracture of lesser trochanter of left femur, subsequent encounter for closed fracture with routine healing: Secondary | ICD-10-CM | POA: Diagnosis not present

## 2019-02-18 DIAGNOSIS — I1310 Hypertensive heart and chronic kidney disease without heart failure, with stage 1 through stage 4 chronic kidney disease, or unspecified chronic kidney disease: Secondary | ICD-10-CM | POA: Diagnosis not present

## 2019-02-18 DIAGNOSIS — I714 Abdominal aortic aneurysm, without rupture: Secondary | ICD-10-CM | POA: Diagnosis not present

## 2019-02-18 DIAGNOSIS — I7090 Unspecified atherosclerosis: Secondary | ICD-10-CM | POA: Diagnosis not present

## 2019-02-18 DIAGNOSIS — D62 Acute posthemorrhagic anemia: Secondary | ICD-10-CM | POA: Diagnosis not present

## 2019-02-18 DIAGNOSIS — N184 Chronic kidney disease, stage 4 (severe): Secondary | ICD-10-CM | POA: Diagnosis not present

## 2019-02-18 DIAGNOSIS — S72122D Displaced fracture of lesser trochanter of left femur, subsequent encounter for closed fracture with routine healing: Secondary | ICD-10-CM | POA: Diagnosis not present

## 2019-02-18 DIAGNOSIS — S72142D Displaced intertrochanteric fracture of left femur, subsequent encounter for closed fracture with routine healing: Secondary | ICD-10-CM | POA: Diagnosis not present

## 2019-02-18 DIAGNOSIS — D631 Anemia in chronic kidney disease: Secondary | ICD-10-CM | POA: Diagnosis not present

## 2019-02-18 DIAGNOSIS — S72112D Displaced fracture of greater trochanter of left femur, subsequent encounter for closed fracture with routine healing: Secondary | ICD-10-CM | POA: Diagnosis not present

## 2019-02-20 DIAGNOSIS — I10 Essential (primary) hypertension: Secondary | ICD-10-CM | POA: Diagnosis not present

## 2019-02-20 DIAGNOSIS — N189 Chronic kidney disease, unspecified: Secondary | ICD-10-CM | POA: Diagnosis not present

## 2019-02-25 DIAGNOSIS — I1310 Hypertensive heart and chronic kidney disease without heart failure, with stage 1 through stage 4 chronic kidney disease, or unspecified chronic kidney disease: Secondary | ICD-10-CM | POA: Diagnosis not present

## 2019-02-25 DIAGNOSIS — S72122D Displaced fracture of lesser trochanter of left femur, subsequent encounter for closed fracture with routine healing: Secondary | ICD-10-CM | POA: Diagnosis not present

## 2019-02-25 DIAGNOSIS — I714 Abdominal aortic aneurysm, without rupture: Secondary | ICD-10-CM | POA: Diagnosis not present

## 2019-02-25 DIAGNOSIS — N184 Chronic kidney disease, stage 4 (severe): Secondary | ICD-10-CM | POA: Diagnosis not present

## 2019-02-25 DIAGNOSIS — S72142D Displaced intertrochanteric fracture of left femur, subsequent encounter for closed fracture with routine healing: Secondary | ICD-10-CM | POA: Diagnosis not present

## 2019-02-25 DIAGNOSIS — D631 Anemia in chronic kidney disease: Secondary | ICD-10-CM | POA: Diagnosis not present

## 2019-02-25 DIAGNOSIS — D62 Acute posthemorrhagic anemia: Secondary | ICD-10-CM | POA: Diagnosis not present

## 2019-02-25 DIAGNOSIS — S72112D Displaced fracture of greater trochanter of left femur, subsequent encounter for closed fracture with routine healing: Secondary | ICD-10-CM | POA: Diagnosis not present

## 2019-02-25 DIAGNOSIS — I7090 Unspecified atherosclerosis: Secondary | ICD-10-CM | POA: Diagnosis not present

## 2019-03-04 DIAGNOSIS — I1310 Hypertensive heart and chronic kidney disease without heart failure, with stage 1 through stage 4 chronic kidney disease, or unspecified chronic kidney disease: Secondary | ICD-10-CM | POA: Diagnosis not present

## 2019-03-04 DIAGNOSIS — D631 Anemia in chronic kidney disease: Secondary | ICD-10-CM | POA: Diagnosis not present

## 2019-03-04 DIAGNOSIS — S72122D Displaced fracture of lesser trochanter of left femur, subsequent encounter for closed fracture with routine healing: Secondary | ICD-10-CM | POA: Diagnosis not present

## 2019-03-04 DIAGNOSIS — I714 Abdominal aortic aneurysm, without rupture: Secondary | ICD-10-CM | POA: Diagnosis not present

## 2019-03-04 DIAGNOSIS — I7090 Unspecified atherosclerosis: Secondary | ICD-10-CM | POA: Diagnosis not present

## 2019-03-04 DIAGNOSIS — D62 Acute posthemorrhagic anemia: Secondary | ICD-10-CM | POA: Diagnosis not present

## 2019-03-04 DIAGNOSIS — S72142D Displaced intertrochanteric fracture of left femur, subsequent encounter for closed fracture with routine healing: Secondary | ICD-10-CM | POA: Diagnosis not present

## 2019-03-04 DIAGNOSIS — S72112D Displaced fracture of greater trochanter of left femur, subsequent encounter for closed fracture with routine healing: Secondary | ICD-10-CM | POA: Diagnosis not present

## 2019-03-04 DIAGNOSIS — N184 Chronic kidney disease, stage 4 (severe): Secondary | ICD-10-CM | POA: Diagnosis not present

## 2019-03-11 DIAGNOSIS — D62 Acute posthemorrhagic anemia: Secondary | ICD-10-CM | POA: Diagnosis not present

## 2019-03-11 DIAGNOSIS — I7090 Unspecified atherosclerosis: Secondary | ICD-10-CM | POA: Diagnosis not present

## 2019-03-11 DIAGNOSIS — S72142D Displaced intertrochanteric fracture of left femur, subsequent encounter for closed fracture with routine healing: Secondary | ICD-10-CM | POA: Diagnosis not present

## 2019-03-11 DIAGNOSIS — N184 Chronic kidney disease, stage 4 (severe): Secondary | ICD-10-CM | POA: Diagnosis not present

## 2019-03-11 DIAGNOSIS — S72112D Displaced fracture of greater trochanter of left femur, subsequent encounter for closed fracture with routine healing: Secondary | ICD-10-CM | POA: Diagnosis not present

## 2019-03-11 DIAGNOSIS — D631 Anemia in chronic kidney disease: Secondary | ICD-10-CM | POA: Diagnosis not present

## 2019-03-11 DIAGNOSIS — S72122D Displaced fracture of lesser trochanter of left femur, subsequent encounter for closed fracture with routine healing: Secondary | ICD-10-CM | POA: Diagnosis not present

## 2019-03-11 DIAGNOSIS — I1310 Hypertensive heart and chronic kidney disease without heart failure, with stage 1 through stage 4 chronic kidney disease, or unspecified chronic kidney disease: Secondary | ICD-10-CM | POA: Diagnosis not present

## 2019-03-11 DIAGNOSIS — I714 Abdominal aortic aneurysm, without rupture: Secondary | ICD-10-CM | POA: Diagnosis not present

## 2019-03-18 DIAGNOSIS — S72122D Displaced fracture of lesser trochanter of left femur, subsequent encounter for closed fracture with routine healing: Secondary | ICD-10-CM | POA: Diagnosis not present

## 2019-03-18 DIAGNOSIS — N184 Chronic kidney disease, stage 4 (severe): Secondary | ICD-10-CM | POA: Diagnosis not present

## 2019-03-18 DIAGNOSIS — D631 Anemia in chronic kidney disease: Secondary | ICD-10-CM | POA: Diagnosis not present

## 2019-03-18 DIAGNOSIS — S72112D Displaced fracture of greater trochanter of left femur, subsequent encounter for closed fracture with routine healing: Secondary | ICD-10-CM | POA: Diagnosis not present

## 2019-03-18 DIAGNOSIS — S72142D Displaced intertrochanteric fracture of left femur, subsequent encounter for closed fracture with routine healing: Secondary | ICD-10-CM | POA: Diagnosis not present

## 2019-03-18 DIAGNOSIS — I7090 Unspecified atherosclerosis: Secondary | ICD-10-CM | POA: Diagnosis not present

## 2019-03-18 DIAGNOSIS — D62 Acute posthemorrhagic anemia: Secondary | ICD-10-CM | POA: Diagnosis not present

## 2019-03-18 DIAGNOSIS — I1310 Hypertensive heart and chronic kidney disease without heart failure, with stage 1 through stage 4 chronic kidney disease, or unspecified chronic kidney disease: Secondary | ICD-10-CM | POA: Diagnosis not present

## 2019-03-18 DIAGNOSIS — I714 Abdominal aortic aneurysm, without rupture: Secondary | ICD-10-CM | POA: Diagnosis not present

## 2019-03-23 DIAGNOSIS — I10 Essential (primary) hypertension: Secondary | ICD-10-CM | POA: Diagnosis not present

## 2019-03-23 DIAGNOSIS — E559 Vitamin D deficiency, unspecified: Secondary | ICD-10-CM | POA: Diagnosis not present

## 2019-03-23 DIAGNOSIS — N1831 Chronic kidney disease, stage 3a: Secondary | ICD-10-CM | POA: Diagnosis not present

## 2019-03-23 DIAGNOSIS — D631 Anemia in chronic kidney disease: Secondary | ICD-10-CM | POA: Diagnosis not present

## 2019-03-23 DIAGNOSIS — Z79899 Other long term (current) drug therapy: Secondary | ICD-10-CM | POA: Diagnosis not present

## 2019-03-23 DIAGNOSIS — D509 Iron deficiency anemia, unspecified: Secondary | ICD-10-CM | POA: Diagnosis not present

## 2019-03-23 DIAGNOSIS — R809 Proteinuria, unspecified: Secondary | ICD-10-CM | POA: Diagnosis not present

## 2019-03-23 DIAGNOSIS — E038 Other specified hypothyroidism: Secondary | ICD-10-CM | POA: Diagnosis not present

## 2019-03-26 DIAGNOSIS — R809 Proteinuria, unspecified: Secondary | ICD-10-CM | POA: Diagnosis not present

## 2019-03-26 DIAGNOSIS — D631 Anemia in chronic kidney disease: Secondary | ICD-10-CM | POA: Diagnosis not present

## 2019-03-26 DIAGNOSIS — N189 Chronic kidney disease, unspecified: Secondary | ICD-10-CM | POA: Diagnosis not present

## 2019-03-26 DIAGNOSIS — S72142D Displaced intertrochanteric fracture of left femur, subsequent encounter for closed fracture with routine healing: Secondary | ICD-10-CM | POA: Diagnosis not present

## 2019-03-26 DIAGNOSIS — D62 Acute posthemorrhagic anemia: Secondary | ICD-10-CM | POA: Diagnosis not present

## 2019-03-26 DIAGNOSIS — I714 Abdominal aortic aneurysm, without rupture: Secondary | ICD-10-CM | POA: Diagnosis not present

## 2019-03-26 DIAGNOSIS — N1832 Chronic kidney disease, stage 3b: Secondary | ICD-10-CM | POA: Diagnosis not present

## 2019-03-26 DIAGNOSIS — I129 Hypertensive chronic kidney disease with stage 1 through stage 4 chronic kidney disease, or unspecified chronic kidney disease: Secondary | ICD-10-CM | POA: Diagnosis not present

## 2019-03-26 DIAGNOSIS — N170 Acute kidney failure with tubular necrosis: Secondary | ICD-10-CM | POA: Diagnosis not present

## 2019-03-26 DIAGNOSIS — S72122D Displaced fracture of lesser trochanter of left femur, subsequent encounter for closed fracture with routine healing: Secondary | ICD-10-CM | POA: Diagnosis not present

## 2019-03-26 DIAGNOSIS — I1310 Hypertensive heart and chronic kidney disease without heart failure, with stage 1 through stage 4 chronic kidney disease, or unspecified chronic kidney disease: Secondary | ICD-10-CM | POA: Diagnosis not present

## 2019-03-26 DIAGNOSIS — N184 Chronic kidney disease, stage 4 (severe): Secondary | ICD-10-CM | POA: Diagnosis not present

## 2019-03-26 DIAGNOSIS — S72112D Displaced fracture of greater trochanter of left femur, subsequent encounter for closed fracture with routine healing: Secondary | ICD-10-CM | POA: Diagnosis not present

## 2019-03-26 DIAGNOSIS — I7090 Unspecified atherosclerosis: Secondary | ICD-10-CM | POA: Diagnosis not present

## 2019-03-30 DIAGNOSIS — M25552 Pain in left hip: Secondary | ICD-10-CM | POA: Diagnosis not present

## 2019-03-30 DIAGNOSIS — S72142G Displaced intertrochanteric fracture of left femur, subsequent encounter for closed fracture with delayed healing: Secondary | ICD-10-CM | POA: Diagnosis not present

## 2019-04-02 DIAGNOSIS — S72142G Displaced intertrochanteric fracture of left femur, subsequent encounter for closed fracture with delayed healing: Secondary | ICD-10-CM | POA: Diagnosis not present

## 2019-04-02 DIAGNOSIS — M25552 Pain in left hip: Secondary | ICD-10-CM | POA: Diagnosis not present

## 2019-04-02 DIAGNOSIS — R262 Difficulty in walking, not elsewhere classified: Secondary | ICD-10-CM | POA: Diagnosis not present

## 2019-04-03 DIAGNOSIS — R262 Difficulty in walking, not elsewhere classified: Secondary | ICD-10-CM | POA: Diagnosis not present

## 2019-04-03 DIAGNOSIS — M25552 Pain in left hip: Secondary | ICD-10-CM | POA: Diagnosis not present

## 2019-04-03 DIAGNOSIS — S72142G Displaced intertrochanteric fracture of left femur, subsequent encounter for closed fracture with delayed healing: Secondary | ICD-10-CM | POA: Diagnosis not present

## 2019-04-07 DIAGNOSIS — S72142G Displaced intertrochanteric fracture of left femur, subsequent encounter for closed fracture with delayed healing: Secondary | ICD-10-CM | POA: Diagnosis not present

## 2019-04-07 DIAGNOSIS — M25552 Pain in left hip: Secondary | ICD-10-CM | POA: Diagnosis not present

## 2019-04-07 DIAGNOSIS — R262 Difficulty in walking, not elsewhere classified: Secondary | ICD-10-CM | POA: Diagnosis not present

## 2019-04-09 DIAGNOSIS — R262 Difficulty in walking, not elsewhere classified: Secondary | ICD-10-CM | POA: Diagnosis not present

## 2019-04-09 DIAGNOSIS — S72142G Displaced intertrochanteric fracture of left femur, subsequent encounter for closed fracture with delayed healing: Secondary | ICD-10-CM | POA: Diagnosis not present

## 2019-04-09 DIAGNOSIS — M25552 Pain in left hip: Secondary | ICD-10-CM | POA: Diagnosis not present

## 2019-04-14 DIAGNOSIS — M25552 Pain in left hip: Secondary | ICD-10-CM | POA: Diagnosis not present

## 2019-04-14 DIAGNOSIS — S72142G Displaced intertrochanteric fracture of left femur, subsequent encounter for closed fracture with delayed healing: Secondary | ICD-10-CM | POA: Diagnosis not present

## 2019-04-14 DIAGNOSIS — R262 Difficulty in walking, not elsewhere classified: Secondary | ICD-10-CM | POA: Diagnosis not present

## 2019-04-16 DIAGNOSIS — M25552 Pain in left hip: Secondary | ICD-10-CM | POA: Diagnosis not present

## 2019-04-16 DIAGNOSIS — R262 Difficulty in walking, not elsewhere classified: Secondary | ICD-10-CM | POA: Diagnosis not present

## 2019-04-16 DIAGNOSIS — S72142G Displaced intertrochanteric fracture of left femur, subsequent encounter for closed fracture with delayed healing: Secondary | ICD-10-CM | POA: Diagnosis not present

## 2019-04-20 DIAGNOSIS — M25552 Pain in left hip: Secondary | ICD-10-CM | POA: Diagnosis not present

## 2019-04-20 DIAGNOSIS — R262 Difficulty in walking, not elsewhere classified: Secondary | ICD-10-CM | POA: Diagnosis not present

## 2019-04-20 DIAGNOSIS — S72142G Displaced intertrochanteric fracture of left femur, subsequent encounter for closed fracture with delayed healing: Secondary | ICD-10-CM | POA: Diagnosis not present

## 2019-04-22 DIAGNOSIS — R262 Difficulty in walking, not elsewhere classified: Secondary | ICD-10-CM | POA: Diagnosis not present

## 2019-04-22 DIAGNOSIS — M25552 Pain in left hip: Secondary | ICD-10-CM | POA: Diagnosis not present

## 2019-04-22 DIAGNOSIS — S72142G Displaced intertrochanteric fracture of left femur, subsequent encounter for closed fracture with delayed healing: Secondary | ICD-10-CM | POA: Diagnosis not present

## 2019-04-28 DIAGNOSIS — M25552 Pain in left hip: Secondary | ICD-10-CM | POA: Diagnosis not present

## 2019-04-28 DIAGNOSIS — R262 Difficulty in walking, not elsewhere classified: Secondary | ICD-10-CM | POA: Diagnosis not present

## 2019-04-28 DIAGNOSIS — S72142G Displaced intertrochanteric fracture of left femur, subsequent encounter for closed fracture with delayed healing: Secondary | ICD-10-CM | POA: Diagnosis not present

## 2019-04-30 DIAGNOSIS — M25552 Pain in left hip: Secondary | ICD-10-CM | POA: Diagnosis not present

## 2019-04-30 DIAGNOSIS — R262 Difficulty in walking, not elsewhere classified: Secondary | ICD-10-CM | POA: Diagnosis not present

## 2019-04-30 DIAGNOSIS — S72142G Displaced intertrochanteric fracture of left femur, subsequent encounter for closed fracture with delayed healing: Secondary | ICD-10-CM | POA: Diagnosis not present

## 2019-05-04 DIAGNOSIS — R262 Difficulty in walking, not elsewhere classified: Secondary | ICD-10-CM | POA: Diagnosis not present

## 2019-05-04 DIAGNOSIS — M25552 Pain in left hip: Secondary | ICD-10-CM | POA: Diagnosis not present

## 2019-05-04 DIAGNOSIS — S72142G Displaced intertrochanteric fracture of left femur, subsequent encounter for closed fracture with delayed healing: Secondary | ICD-10-CM | POA: Diagnosis not present

## 2019-05-07 DIAGNOSIS — M25552 Pain in left hip: Secondary | ICD-10-CM | POA: Diagnosis not present

## 2019-05-07 DIAGNOSIS — R262 Difficulty in walking, not elsewhere classified: Secondary | ICD-10-CM | POA: Diagnosis not present

## 2019-05-07 DIAGNOSIS — S72142G Displaced intertrochanteric fracture of left femur, subsequent encounter for closed fracture with delayed healing: Secondary | ICD-10-CM | POA: Diagnosis not present

## 2019-05-11 DIAGNOSIS — M25552 Pain in left hip: Secondary | ICD-10-CM | POA: Diagnosis not present

## 2019-05-11 DIAGNOSIS — S72142G Displaced intertrochanteric fracture of left femur, subsequent encounter for closed fracture with delayed healing: Secondary | ICD-10-CM | POA: Diagnosis not present

## 2019-05-11 DIAGNOSIS — R262 Difficulty in walking, not elsewhere classified: Secondary | ICD-10-CM | POA: Diagnosis not present

## 2019-05-14 DIAGNOSIS — R262 Difficulty in walking, not elsewhere classified: Secondary | ICD-10-CM | POA: Diagnosis not present

## 2019-05-14 DIAGNOSIS — S72142G Displaced intertrochanteric fracture of left femur, subsequent encounter for closed fracture with delayed healing: Secondary | ICD-10-CM | POA: Diagnosis not present

## 2019-05-14 DIAGNOSIS — M25552 Pain in left hip: Secondary | ICD-10-CM | POA: Diagnosis not present

## 2019-05-18 DIAGNOSIS — M25552 Pain in left hip: Secondary | ICD-10-CM | POA: Diagnosis not present

## 2019-05-18 DIAGNOSIS — S72142G Displaced intertrochanteric fracture of left femur, subsequent encounter for closed fracture with delayed healing: Secondary | ICD-10-CM | POA: Diagnosis not present

## 2019-05-18 DIAGNOSIS — R262 Difficulty in walking, not elsewhere classified: Secondary | ICD-10-CM | POA: Diagnosis not present

## 2019-05-19 DIAGNOSIS — I129 Hypertensive chronic kidney disease with stage 1 through stage 4 chronic kidney disease, or unspecified chronic kidney disease: Secondary | ICD-10-CM | POA: Diagnosis not present

## 2019-05-19 DIAGNOSIS — R809 Proteinuria, unspecified: Secondary | ICD-10-CM | POA: Diagnosis not present

## 2019-05-19 DIAGNOSIS — N170 Acute kidney failure with tubular necrosis: Secondary | ICD-10-CM | POA: Diagnosis not present

## 2019-05-19 DIAGNOSIS — N189 Chronic kidney disease, unspecified: Secondary | ICD-10-CM | POA: Diagnosis not present

## 2019-05-19 DIAGNOSIS — N1832 Chronic kidney disease, stage 3b: Secondary | ICD-10-CM | POA: Diagnosis not present

## 2019-05-19 DIAGNOSIS — D631 Anemia in chronic kidney disease: Secondary | ICD-10-CM | POA: Diagnosis not present

## 2019-05-21 DIAGNOSIS — M25552 Pain in left hip: Secondary | ICD-10-CM | POA: Diagnosis not present

## 2019-05-21 DIAGNOSIS — R262 Difficulty in walking, not elsewhere classified: Secondary | ICD-10-CM | POA: Diagnosis not present

## 2019-05-21 DIAGNOSIS — S72142G Displaced intertrochanteric fracture of left femur, subsequent encounter for closed fracture with delayed healing: Secondary | ICD-10-CM | POA: Diagnosis not present

## 2019-05-22 DIAGNOSIS — I10 Essential (primary) hypertension: Secondary | ICD-10-CM | POA: Diagnosis not present

## 2019-05-22 DIAGNOSIS — E039 Hypothyroidism, unspecified: Secondary | ICD-10-CM | POA: Diagnosis not present

## 2019-05-25 DIAGNOSIS — M25552 Pain in left hip: Secondary | ICD-10-CM | POA: Diagnosis not present

## 2019-05-25 DIAGNOSIS — R262 Difficulty in walking, not elsewhere classified: Secondary | ICD-10-CM | POA: Diagnosis not present

## 2019-05-25 DIAGNOSIS — S72142G Displaced intertrochanteric fracture of left femur, subsequent encounter for closed fracture with delayed healing: Secondary | ICD-10-CM | POA: Diagnosis not present

## 2019-05-27 DIAGNOSIS — N1832 Chronic kidney disease, stage 3b: Secondary | ICD-10-CM | POA: Diagnosis not present

## 2019-05-27 DIAGNOSIS — N170 Acute kidney failure with tubular necrosis: Secondary | ICD-10-CM | POA: Diagnosis not present

## 2019-05-27 DIAGNOSIS — I129 Hypertensive chronic kidney disease with stage 1 through stage 4 chronic kidney disease, or unspecified chronic kidney disease: Secondary | ICD-10-CM | POA: Diagnosis not present

## 2019-05-27 DIAGNOSIS — N189 Chronic kidney disease, unspecified: Secondary | ICD-10-CM | POA: Diagnosis not present

## 2019-05-27 DIAGNOSIS — R809 Proteinuria, unspecified: Secondary | ICD-10-CM | POA: Diagnosis not present

## 2019-05-27 DIAGNOSIS — D631 Anemia in chronic kidney disease: Secondary | ICD-10-CM | POA: Diagnosis not present

## 2019-05-28 DIAGNOSIS — Z961 Presence of intraocular lens: Secondary | ICD-10-CM | POA: Diagnosis not present

## 2019-05-28 DIAGNOSIS — H353131 Nonexudative age-related macular degeneration, bilateral, early dry stage: Secondary | ICD-10-CM | POA: Diagnosis not present

## 2019-05-28 DIAGNOSIS — M25552 Pain in left hip: Secondary | ICD-10-CM | POA: Diagnosis not present

## 2019-05-28 DIAGNOSIS — H524 Presbyopia: Secondary | ICD-10-CM | POA: Diagnosis not present

## 2019-05-28 DIAGNOSIS — S72142G Displaced intertrochanteric fracture of left femur, subsequent encounter for closed fracture with delayed healing: Secondary | ICD-10-CM | POA: Diagnosis not present

## 2019-05-28 DIAGNOSIS — H26493 Other secondary cataract, bilateral: Secondary | ICD-10-CM | POA: Diagnosis not present

## 2019-05-28 DIAGNOSIS — R262 Difficulty in walking, not elsewhere classified: Secondary | ICD-10-CM | POA: Diagnosis not present

## 2019-06-01 DIAGNOSIS — M25552 Pain in left hip: Secondary | ICD-10-CM | POA: Diagnosis not present

## 2019-06-01 DIAGNOSIS — S72142G Displaced intertrochanteric fracture of left femur, subsequent encounter for closed fracture with delayed healing: Secondary | ICD-10-CM | POA: Diagnosis not present

## 2019-06-02 DIAGNOSIS — R262 Difficulty in walking, not elsewhere classified: Secondary | ICD-10-CM | POA: Diagnosis not present

## 2019-06-02 DIAGNOSIS — M25552 Pain in left hip: Secondary | ICD-10-CM | POA: Diagnosis not present

## 2019-06-02 DIAGNOSIS — S72142G Displaced intertrochanteric fracture of left femur, subsequent encounter for closed fracture with delayed healing: Secondary | ICD-10-CM | POA: Diagnosis not present

## 2019-06-04 DIAGNOSIS — R262 Difficulty in walking, not elsewhere classified: Secondary | ICD-10-CM | POA: Diagnosis not present

## 2019-06-04 DIAGNOSIS — M25552 Pain in left hip: Secondary | ICD-10-CM | POA: Diagnosis not present

## 2019-06-04 DIAGNOSIS — S72142G Displaced intertrochanteric fracture of left femur, subsequent encounter for closed fracture with delayed healing: Secondary | ICD-10-CM | POA: Diagnosis not present

## 2019-06-08 DIAGNOSIS — R262 Difficulty in walking, not elsewhere classified: Secondary | ICD-10-CM | POA: Diagnosis not present

## 2019-06-08 DIAGNOSIS — M25552 Pain in left hip: Secondary | ICD-10-CM | POA: Diagnosis not present

## 2019-06-08 DIAGNOSIS — S72142G Displaced intertrochanteric fracture of left femur, subsequent encounter for closed fracture with delayed healing: Secondary | ICD-10-CM | POA: Diagnosis not present

## 2019-06-10 DIAGNOSIS — R262 Difficulty in walking, not elsewhere classified: Secondary | ICD-10-CM | POA: Diagnosis not present

## 2019-06-10 DIAGNOSIS — S72142G Displaced intertrochanteric fracture of left femur, subsequent encounter for closed fracture with delayed healing: Secondary | ICD-10-CM | POA: Diagnosis not present

## 2019-06-10 DIAGNOSIS — M25552 Pain in left hip: Secondary | ICD-10-CM | POA: Diagnosis not present

## 2019-06-15 DIAGNOSIS — M25552 Pain in left hip: Secondary | ICD-10-CM | POA: Diagnosis not present

## 2019-06-15 DIAGNOSIS — R262 Difficulty in walking, not elsewhere classified: Secondary | ICD-10-CM | POA: Diagnosis not present

## 2019-06-15 DIAGNOSIS — S72142G Displaced intertrochanteric fracture of left femur, subsequent encounter for closed fracture with delayed healing: Secondary | ICD-10-CM | POA: Diagnosis not present

## 2019-06-18 DIAGNOSIS — S72142G Displaced intertrochanteric fracture of left femur, subsequent encounter for closed fracture with delayed healing: Secondary | ICD-10-CM | POA: Diagnosis not present

## 2019-06-18 DIAGNOSIS — M25552 Pain in left hip: Secondary | ICD-10-CM | POA: Diagnosis not present

## 2019-06-18 DIAGNOSIS — R262 Difficulty in walking, not elsewhere classified: Secondary | ICD-10-CM | POA: Diagnosis not present

## 2019-06-19 DIAGNOSIS — I10 Essential (primary) hypertension: Secondary | ICD-10-CM | POA: Diagnosis not present

## 2019-06-19 DIAGNOSIS — E038 Other specified hypothyroidism: Secondary | ICD-10-CM | POA: Diagnosis not present

## 2019-06-22 DIAGNOSIS — M25552 Pain in left hip: Secondary | ICD-10-CM | POA: Diagnosis not present

## 2019-06-22 DIAGNOSIS — R262 Difficulty in walking, not elsewhere classified: Secondary | ICD-10-CM | POA: Diagnosis not present

## 2019-06-22 DIAGNOSIS — S72142G Displaced intertrochanteric fracture of left femur, subsequent encounter for closed fracture with delayed healing: Secondary | ICD-10-CM | POA: Diagnosis not present

## 2019-06-25 DIAGNOSIS — M25552 Pain in left hip: Secondary | ICD-10-CM | POA: Diagnosis not present

## 2019-06-25 DIAGNOSIS — R262 Difficulty in walking, not elsewhere classified: Secondary | ICD-10-CM | POA: Diagnosis not present

## 2019-06-25 DIAGNOSIS — S72142G Displaced intertrochanteric fracture of left femur, subsequent encounter for closed fracture with delayed healing: Secondary | ICD-10-CM | POA: Diagnosis not present

## 2019-06-29 DIAGNOSIS — M25552 Pain in left hip: Secondary | ICD-10-CM | POA: Diagnosis not present

## 2019-06-29 DIAGNOSIS — S72142G Displaced intertrochanteric fracture of left femur, subsequent encounter for closed fracture with delayed healing: Secondary | ICD-10-CM | POA: Diagnosis not present

## 2019-06-29 DIAGNOSIS — R262 Difficulty in walking, not elsewhere classified: Secondary | ICD-10-CM | POA: Diagnosis not present

## 2019-07-02 DIAGNOSIS — R262 Difficulty in walking, not elsewhere classified: Secondary | ICD-10-CM | POA: Diagnosis not present

## 2019-07-02 DIAGNOSIS — S72142G Displaced intertrochanteric fracture of left femur, subsequent encounter for closed fracture with delayed healing: Secondary | ICD-10-CM | POA: Diagnosis not present

## 2019-07-02 DIAGNOSIS — M25552 Pain in left hip: Secondary | ICD-10-CM | POA: Diagnosis not present

## 2019-07-14 DIAGNOSIS — Z9989 Dependence on other enabling machines and devices: Secondary | ICD-10-CM | POA: Diagnosis not present

## 2019-07-14 DIAGNOSIS — K219 Gastro-esophageal reflux disease without esophagitis: Secondary | ICD-10-CM | POA: Diagnosis not present

## 2019-07-14 DIAGNOSIS — Z6826 Body mass index (BMI) 26.0-26.9, adult: Secondary | ICD-10-CM | POA: Diagnosis not present

## 2019-07-14 DIAGNOSIS — E039 Hypothyroidism, unspecified: Secondary | ICD-10-CM | POA: Diagnosis not present

## 2019-07-14 DIAGNOSIS — I10 Essential (primary) hypertension: Secondary | ICD-10-CM | POA: Diagnosis not present

## 2019-07-14 DIAGNOSIS — D692 Other nonthrombocytopenic purpura: Secondary | ICD-10-CM | POA: Diagnosis not present

## 2019-07-22 DIAGNOSIS — I10 Essential (primary) hypertension: Secondary | ICD-10-CM | POA: Diagnosis not present

## 2019-07-22 DIAGNOSIS — E038 Other specified hypothyroidism: Secondary | ICD-10-CM | POA: Diagnosis not present

## 2019-07-23 DIAGNOSIS — D631 Anemia in chronic kidney disease: Secondary | ICD-10-CM | POA: Diagnosis not present

## 2019-07-23 DIAGNOSIS — R809 Proteinuria, unspecified: Secondary | ICD-10-CM | POA: Diagnosis not present

## 2019-07-23 DIAGNOSIS — I129 Hypertensive chronic kidney disease with stage 1 through stage 4 chronic kidney disease, or unspecified chronic kidney disease: Secondary | ICD-10-CM | POA: Diagnosis not present

## 2019-07-23 DIAGNOSIS — N189 Chronic kidney disease, unspecified: Secondary | ICD-10-CM | POA: Diagnosis not present

## 2019-07-23 DIAGNOSIS — N170 Acute kidney failure with tubular necrosis: Secondary | ICD-10-CM | POA: Diagnosis not present

## 2019-07-23 DIAGNOSIS — N1832 Chronic kidney disease, stage 3b: Secondary | ICD-10-CM | POA: Diagnosis not present

## 2019-07-27 DIAGNOSIS — Z961 Presence of intraocular lens: Secondary | ICD-10-CM | POA: Diagnosis not present

## 2019-07-27 DIAGNOSIS — M25552 Pain in left hip: Secondary | ICD-10-CM | POA: Diagnosis not present

## 2019-07-27 DIAGNOSIS — H26492 Other secondary cataract, left eye: Secondary | ICD-10-CM | POA: Diagnosis not present

## 2019-07-27 DIAGNOSIS — H353131 Nonexudative age-related macular degeneration, bilateral, early dry stage: Secondary | ICD-10-CM | POA: Diagnosis not present

## 2019-07-27 DIAGNOSIS — S72142D Displaced intertrochanteric fracture of left femur, subsequent encounter for closed fracture with routine healing: Secondary | ICD-10-CM | POA: Diagnosis not present

## 2019-07-29 DIAGNOSIS — N1832 Chronic kidney disease, stage 3b: Secondary | ICD-10-CM | POA: Diagnosis not present

## 2019-07-29 DIAGNOSIS — Z79899 Other long term (current) drug therapy: Secondary | ICD-10-CM | POA: Diagnosis not present

## 2019-07-29 DIAGNOSIS — I129 Hypertensive chronic kidney disease with stage 1 through stage 4 chronic kidney disease, or unspecified chronic kidney disease: Secondary | ICD-10-CM | POA: Diagnosis not present

## 2019-07-29 DIAGNOSIS — D631 Anemia in chronic kidney disease: Secondary | ICD-10-CM | POA: Diagnosis not present

## 2019-07-29 DIAGNOSIS — R809 Proteinuria, unspecified: Secondary | ICD-10-CM | POA: Diagnosis not present

## 2019-07-29 DIAGNOSIS — N189 Chronic kidney disease, unspecified: Secondary | ICD-10-CM | POA: Diagnosis not present

## 2019-07-31 DIAGNOSIS — S72142D Displaced intertrochanteric fracture of left femur, subsequent encounter for closed fracture with routine healing: Secondary | ICD-10-CM | POA: Diagnosis not present

## 2019-07-31 DIAGNOSIS — R2689 Other abnormalities of gait and mobility: Secondary | ICD-10-CM | POA: Diagnosis not present

## 2019-07-31 DIAGNOSIS — R262 Difficulty in walking, not elsewhere classified: Secondary | ICD-10-CM | POA: Diagnosis not present

## 2019-07-31 DIAGNOSIS — M6281 Muscle weakness (generalized): Secondary | ICD-10-CM | POA: Diagnosis not present

## 2019-08-03 DIAGNOSIS — I2510 Atherosclerotic heart disease of native coronary artery without angina pectoris: Secondary | ICD-10-CM | POA: Diagnosis not present

## 2019-08-03 DIAGNOSIS — N184 Chronic kidney disease, stage 4 (severe): Secondary | ICD-10-CM | POA: Diagnosis not present

## 2019-08-03 DIAGNOSIS — Z89421 Acquired absence of other right toe(s): Secondary | ICD-10-CM | POA: Diagnosis not present

## 2019-08-03 DIAGNOSIS — Z6828 Body mass index (BMI) 28.0-28.9, adult: Secondary | ICD-10-CM | POA: Diagnosis not present

## 2019-08-03 DIAGNOSIS — S8292XD Unspecified fracture of left lower leg, subsequent encounter for closed fracture with routine healing: Secondary | ICD-10-CM | POA: Diagnosis not present

## 2019-08-03 DIAGNOSIS — Z9989 Dependence on other enabling machines and devices: Secondary | ICD-10-CM | POA: Diagnosis not present

## 2019-08-03 DIAGNOSIS — Z89411 Acquired absence of right great toe: Secondary | ICD-10-CM | POA: Diagnosis not present

## 2019-08-04 DIAGNOSIS — R2689 Other abnormalities of gait and mobility: Secondary | ICD-10-CM | POA: Diagnosis not present

## 2019-08-04 DIAGNOSIS — M6281 Muscle weakness (generalized): Secondary | ICD-10-CM | POA: Diagnosis not present

## 2019-08-04 DIAGNOSIS — R262 Difficulty in walking, not elsewhere classified: Secondary | ICD-10-CM | POA: Diagnosis not present

## 2019-08-04 DIAGNOSIS — S72142D Displaced intertrochanteric fracture of left femur, subsequent encounter for closed fracture with routine healing: Secondary | ICD-10-CM | POA: Diagnosis not present

## 2019-08-11 DIAGNOSIS — M6281 Muscle weakness (generalized): Secondary | ICD-10-CM | POA: Diagnosis not present

## 2019-08-11 DIAGNOSIS — R262 Difficulty in walking, not elsewhere classified: Secondary | ICD-10-CM | POA: Diagnosis not present

## 2019-08-11 DIAGNOSIS — S72142D Displaced intertrochanteric fracture of left femur, subsequent encounter for closed fracture with routine healing: Secondary | ICD-10-CM | POA: Diagnosis not present

## 2019-08-11 DIAGNOSIS — R2689 Other abnormalities of gait and mobility: Secondary | ICD-10-CM | POA: Diagnosis not present

## 2019-08-18 DIAGNOSIS — R2689 Other abnormalities of gait and mobility: Secondary | ICD-10-CM | POA: Diagnosis not present

## 2019-08-18 DIAGNOSIS — R262 Difficulty in walking, not elsewhere classified: Secondary | ICD-10-CM | POA: Diagnosis not present

## 2019-08-18 DIAGNOSIS — M6281 Muscle weakness (generalized): Secondary | ICD-10-CM | POA: Diagnosis not present

## 2019-08-18 DIAGNOSIS — S72142D Displaced intertrochanteric fracture of left femur, subsequent encounter for closed fracture with routine healing: Secondary | ICD-10-CM | POA: Diagnosis not present

## 2019-08-25 DIAGNOSIS — R2689 Other abnormalities of gait and mobility: Secondary | ICD-10-CM | POA: Diagnosis not present

## 2019-08-25 DIAGNOSIS — R262 Difficulty in walking, not elsewhere classified: Secondary | ICD-10-CM | POA: Diagnosis not present

## 2019-08-25 DIAGNOSIS — S72142D Displaced intertrochanteric fracture of left femur, subsequent encounter for closed fracture with routine healing: Secondary | ICD-10-CM | POA: Diagnosis not present

## 2019-08-25 DIAGNOSIS — M6281 Muscle weakness (generalized): Secondary | ICD-10-CM | POA: Diagnosis not present

## 2019-09-01 DIAGNOSIS — R262 Difficulty in walking, not elsewhere classified: Secondary | ICD-10-CM | POA: Diagnosis not present

## 2019-09-01 DIAGNOSIS — S72142D Displaced intertrochanteric fracture of left femur, subsequent encounter for closed fracture with routine healing: Secondary | ICD-10-CM | POA: Diagnosis not present

## 2019-09-01 DIAGNOSIS — M6281 Muscle weakness (generalized): Secondary | ICD-10-CM | POA: Diagnosis not present

## 2019-09-01 DIAGNOSIS — R2689 Other abnormalities of gait and mobility: Secondary | ICD-10-CM | POA: Diagnosis not present

## 2019-09-06 DIAGNOSIS — E559 Vitamin D deficiency, unspecified: Secondary | ICD-10-CM | POA: Diagnosis not present

## 2019-09-06 DIAGNOSIS — Z9841 Cataract extraction status, right eye: Secondary | ICD-10-CM | POA: Diagnosis not present

## 2019-09-06 DIAGNOSIS — M159 Polyosteoarthritis, unspecified: Secondary | ICD-10-CM | POA: Diagnosis not present

## 2019-09-06 DIAGNOSIS — I129 Hypertensive chronic kidney disease with stage 1 through stage 4 chronic kidney disease, or unspecified chronic kidney disease: Secondary | ICD-10-CM | POA: Diagnosis not present

## 2019-09-06 DIAGNOSIS — Z6824 Body mass index (BMI) 24.0-24.9, adult: Secondary | ICD-10-CM | POA: Diagnosis not present

## 2019-09-06 DIAGNOSIS — E039 Hypothyroidism, unspecified: Secondary | ICD-10-CM | POA: Diagnosis not present

## 2019-09-06 DIAGNOSIS — N189 Chronic kidney disease, unspecified: Secondary | ICD-10-CM | POA: Diagnosis not present

## 2019-09-06 DIAGNOSIS — Z9842 Cataract extraction status, left eye: Secondary | ICD-10-CM | POA: Diagnosis not present

## 2019-09-06 DIAGNOSIS — I2510 Atherosclerotic heart disease of native coronary artery without angina pectoris: Secondary | ICD-10-CM | POA: Diagnosis not present

## 2019-09-06 DIAGNOSIS — K219 Gastro-esophageal reflux disease without esophagitis: Secondary | ICD-10-CM | POA: Diagnosis not present

## 2019-09-06 DIAGNOSIS — H9193 Unspecified hearing loss, bilateral: Secondary | ICD-10-CM | POA: Diagnosis not present

## 2019-09-08 DIAGNOSIS — R262 Difficulty in walking, not elsewhere classified: Secondary | ICD-10-CM | POA: Diagnosis not present

## 2019-09-08 DIAGNOSIS — S72142D Displaced intertrochanteric fracture of left femur, subsequent encounter for closed fracture with routine healing: Secondary | ICD-10-CM | POA: Diagnosis not present

## 2019-09-15 DIAGNOSIS — S72142D Displaced intertrochanteric fracture of left femur, subsequent encounter for closed fracture with routine healing: Secondary | ICD-10-CM | POA: Diagnosis not present

## 2019-09-15 DIAGNOSIS — R262 Difficulty in walking, not elsewhere classified: Secondary | ICD-10-CM | POA: Diagnosis not present

## 2019-09-21 DIAGNOSIS — I129 Hypertensive chronic kidney disease with stage 1 through stage 4 chronic kidney disease, or unspecified chronic kidney disease: Secondary | ICD-10-CM | POA: Diagnosis not present

## 2019-09-21 DIAGNOSIS — N1832 Chronic kidney disease, stage 3b: Secondary | ICD-10-CM | POA: Diagnosis not present

## 2019-09-21 DIAGNOSIS — E038 Other specified hypothyroidism: Secondary | ICD-10-CM | POA: Diagnosis not present

## 2019-09-22 DIAGNOSIS — S72142D Displaced intertrochanteric fracture of left femur, subsequent encounter for closed fracture with routine healing: Secondary | ICD-10-CM | POA: Diagnosis not present

## 2019-09-22 DIAGNOSIS — R262 Difficulty in walking, not elsewhere classified: Secondary | ICD-10-CM | POA: Diagnosis not present

## 2019-09-23 DIAGNOSIS — D631 Anemia in chronic kidney disease: Secondary | ICD-10-CM | POA: Diagnosis not present

## 2019-09-23 DIAGNOSIS — N1832 Chronic kidney disease, stage 3b: Secondary | ICD-10-CM | POA: Diagnosis not present

## 2019-09-23 DIAGNOSIS — N189 Chronic kidney disease, unspecified: Secondary | ICD-10-CM | POA: Diagnosis not present

## 2019-09-23 DIAGNOSIS — R809 Proteinuria, unspecified: Secondary | ICD-10-CM | POA: Diagnosis not present

## 2019-09-23 DIAGNOSIS — Z79899 Other long term (current) drug therapy: Secondary | ICD-10-CM | POA: Diagnosis not present

## 2019-09-23 DIAGNOSIS — I129 Hypertensive chronic kidney disease with stage 1 through stage 4 chronic kidney disease, or unspecified chronic kidney disease: Secondary | ICD-10-CM | POA: Diagnosis not present

## 2019-09-29 DIAGNOSIS — S72142D Displaced intertrochanteric fracture of left femur, subsequent encounter for closed fracture with routine healing: Secondary | ICD-10-CM | POA: Diagnosis not present

## 2019-09-29 DIAGNOSIS — R262 Difficulty in walking, not elsewhere classified: Secondary | ICD-10-CM | POA: Diagnosis not present

## 2019-09-30 DIAGNOSIS — N1832 Chronic kidney disease, stage 3b: Secondary | ICD-10-CM | POA: Diagnosis not present

## 2019-09-30 DIAGNOSIS — I129 Hypertensive chronic kidney disease with stage 1 through stage 4 chronic kidney disease, or unspecified chronic kidney disease: Secondary | ICD-10-CM | POA: Diagnosis not present

## 2019-09-30 DIAGNOSIS — R809 Proteinuria, unspecified: Secondary | ICD-10-CM | POA: Diagnosis not present

## 2019-09-30 DIAGNOSIS — N189 Chronic kidney disease, unspecified: Secondary | ICD-10-CM | POA: Diagnosis not present

## 2019-09-30 DIAGNOSIS — D631 Anemia in chronic kidney disease: Secondary | ICD-10-CM | POA: Diagnosis not present

## 2019-10-06 DIAGNOSIS — Z6825 Body mass index (BMI) 25.0-25.9, adult: Secondary | ICD-10-CM | POA: Diagnosis not present

## 2019-10-06 DIAGNOSIS — H9193 Unspecified hearing loss, bilateral: Secondary | ICD-10-CM | POA: Diagnosis not present

## 2019-10-06 DIAGNOSIS — R5383 Other fatigue: Secondary | ICD-10-CM | POA: Diagnosis not present

## 2019-10-06 DIAGNOSIS — D519 Vitamin B12 deficiency anemia, unspecified: Secondary | ICD-10-CM | POA: Diagnosis not present

## 2019-10-06 DIAGNOSIS — R262 Difficulty in walking, not elsewhere classified: Secondary | ICD-10-CM | POA: Diagnosis not present

## 2019-10-06 DIAGNOSIS — I10 Essential (primary) hypertension: Secondary | ICD-10-CM | POA: Diagnosis not present

## 2019-10-06 DIAGNOSIS — Z13220 Encounter for screening for lipoid disorders: Secondary | ICD-10-CM | POA: Diagnosis not present

## 2019-10-06 DIAGNOSIS — S72142D Displaced intertrochanteric fracture of left femur, subsequent encounter for closed fracture with routine healing: Secondary | ICD-10-CM | POA: Diagnosis not present

## 2019-10-06 DIAGNOSIS — E038 Other specified hypothyroidism: Secondary | ICD-10-CM | POA: Diagnosis not present

## 2019-10-06 DIAGNOSIS — Z0001 Encounter for general adult medical examination with abnormal findings: Secondary | ICD-10-CM | POA: Diagnosis not present

## 2019-10-06 DIAGNOSIS — N189 Chronic kidney disease, unspecified: Secondary | ICD-10-CM | POA: Diagnosis not present

## 2019-10-09 DIAGNOSIS — H9193 Unspecified hearing loss, bilateral: Secondary | ICD-10-CM | POA: Diagnosis not present

## 2019-10-09 DIAGNOSIS — E038 Other specified hypothyroidism: Secondary | ICD-10-CM | POA: Diagnosis not present

## 2019-10-09 DIAGNOSIS — Z0001 Encounter for general adult medical examination with abnormal findings: Secondary | ICD-10-CM | POA: Diagnosis not present

## 2019-10-09 DIAGNOSIS — I10 Essential (primary) hypertension: Secondary | ICD-10-CM | POA: Diagnosis not present

## 2019-10-09 DIAGNOSIS — Z6825 Body mass index (BMI) 25.0-25.9, adult: Secondary | ICD-10-CM | POA: Diagnosis not present

## 2019-10-09 DIAGNOSIS — N189 Chronic kidney disease, unspecified: Secondary | ICD-10-CM | POA: Diagnosis not present

## 2019-10-13 DIAGNOSIS — S72142D Displaced intertrochanteric fracture of left femur, subsequent encounter for closed fracture with routine healing: Secondary | ICD-10-CM | POA: Diagnosis not present

## 2019-10-13 DIAGNOSIS — R262 Difficulty in walking, not elsewhere classified: Secondary | ICD-10-CM | POA: Diagnosis not present

## 2019-10-29 DIAGNOSIS — Z6825 Body mass index (BMI) 25.0-25.9, adult: Secondary | ICD-10-CM | POA: Diagnosis not present

## 2019-10-29 DIAGNOSIS — M47812 Spondylosis without myelopathy or radiculopathy, cervical region: Secondary | ICD-10-CM | POA: Diagnosis not present

## 2019-11-05 DIAGNOSIS — D692 Other nonthrombocytopenic purpura: Secondary | ICD-10-CM | POA: Diagnosis not present

## 2019-11-05 DIAGNOSIS — I739 Peripheral vascular disease, unspecified: Secondary | ICD-10-CM | POA: Diagnosis not present

## 2019-11-05 DIAGNOSIS — E039 Hypothyroidism, unspecified: Secondary | ICD-10-CM | POA: Diagnosis not present

## 2019-11-05 DIAGNOSIS — Z89411 Acquired absence of right great toe: Secondary | ICD-10-CM | POA: Diagnosis not present

## 2019-11-05 DIAGNOSIS — I129 Hypertensive chronic kidney disease with stage 1 through stage 4 chronic kidney disease, or unspecified chronic kidney disease: Secondary | ICD-10-CM | POA: Diagnosis not present

## 2019-11-05 DIAGNOSIS — I2510 Atherosclerotic heart disease of native coronary artery without angina pectoris: Secondary | ICD-10-CM | POA: Diagnosis not present

## 2019-11-05 DIAGNOSIS — N184 Chronic kidney disease, stage 4 (severe): Secondary | ICD-10-CM | POA: Diagnosis not present

## 2019-11-05 DIAGNOSIS — Z89421 Acquired absence of other right toe(s): Secondary | ICD-10-CM | POA: Diagnosis not present

## 2019-11-05 DIAGNOSIS — K219 Gastro-esophageal reflux disease without esophagitis: Secondary | ICD-10-CM | POA: Diagnosis not present

## 2019-11-17 DIAGNOSIS — E038 Other specified hypothyroidism: Secondary | ICD-10-CM | POA: Diagnosis not present

## 2019-11-20 DIAGNOSIS — E038 Other specified hypothyroidism: Secondary | ICD-10-CM | POA: Diagnosis not present

## 2019-11-20 DIAGNOSIS — I129 Hypertensive chronic kidney disease with stage 1 through stage 4 chronic kidney disease, or unspecified chronic kidney disease: Secondary | ICD-10-CM | POA: Diagnosis not present

## 2019-11-20 DIAGNOSIS — N1832 Chronic kidney disease, stage 3b: Secondary | ICD-10-CM | POA: Diagnosis not present

## 2019-12-09 DIAGNOSIS — R1310 Dysphagia, unspecified: Secondary | ICD-10-CM | POA: Diagnosis not present

## 2019-12-09 DIAGNOSIS — Z6824 Body mass index (BMI) 24.0-24.9, adult: Secondary | ICD-10-CM | POA: Diagnosis not present

## 2019-12-09 DIAGNOSIS — R634 Abnormal weight loss: Secondary | ICD-10-CM | POA: Diagnosis not present

## 2019-12-14 ENCOUNTER — Encounter: Payer: Self-pay | Admitting: Internal Medicine

## 2019-12-18 ENCOUNTER — Telehealth: Payer: Self-pay

## 2019-12-18 NOTE — Telephone Encounter (Signed)
Pt called after seeing letter about his upcoming apt with Dr. Abbey Chatters. Pt would like to be placed on a cancellation list with Dr. Abbey Chatters to be seen sooner.

## 2019-12-22 DIAGNOSIS — N1832 Chronic kidney disease, stage 3b: Secondary | ICD-10-CM | POA: Diagnosis not present

## 2019-12-22 DIAGNOSIS — E038 Other specified hypothyroidism: Secondary | ICD-10-CM | POA: Diagnosis not present

## 2019-12-22 DIAGNOSIS — I129 Hypertensive chronic kidney disease with stage 1 through stage 4 chronic kidney disease, or unspecified chronic kidney disease: Secondary | ICD-10-CM | POA: Diagnosis not present

## 2019-12-22 NOTE — Telephone Encounter (Signed)
Added patient to cancellation list  °

## 2019-12-22 NOTE — Telephone Encounter (Signed)
Noted  

## 2019-12-25 DIAGNOSIS — N1832 Chronic kidney disease, stage 3b: Secondary | ICD-10-CM | POA: Diagnosis not present

## 2019-12-25 DIAGNOSIS — D631 Anemia in chronic kidney disease: Secondary | ICD-10-CM | POA: Diagnosis not present

## 2019-12-25 DIAGNOSIS — R809 Proteinuria, unspecified: Secondary | ICD-10-CM | POA: Diagnosis not present

## 2019-12-25 DIAGNOSIS — I129 Hypertensive chronic kidney disease with stage 1 through stage 4 chronic kidney disease, or unspecified chronic kidney disease: Secondary | ICD-10-CM | POA: Diagnosis not present

## 2019-12-25 DIAGNOSIS — N189 Chronic kidney disease, unspecified: Secondary | ICD-10-CM | POA: Diagnosis not present

## 2020-01-01 DIAGNOSIS — R634 Abnormal weight loss: Secondary | ICD-10-CM | POA: Diagnosis not present

## 2020-01-01 DIAGNOSIS — E039 Hypothyroidism, unspecified: Secondary | ICD-10-CM | POA: Diagnosis not present

## 2020-01-01 DIAGNOSIS — R809 Proteinuria, unspecified: Secondary | ICD-10-CM | POA: Diagnosis not present

## 2020-01-01 DIAGNOSIS — D631 Anemia in chronic kidney disease: Secondary | ICD-10-CM | POA: Diagnosis not present

## 2020-01-01 DIAGNOSIS — I129 Hypertensive chronic kidney disease with stage 1 through stage 4 chronic kidney disease, or unspecified chronic kidney disease: Secondary | ICD-10-CM | POA: Diagnosis not present

## 2020-01-01 DIAGNOSIS — N189 Chronic kidney disease, unspecified: Secondary | ICD-10-CM | POA: Diagnosis not present

## 2020-01-01 DIAGNOSIS — R1310 Dysphagia, unspecified: Secondary | ICD-10-CM | POA: Diagnosis not present

## 2020-01-01 DIAGNOSIS — Z6825 Body mass index (BMI) 25.0-25.9, adult: Secondary | ICD-10-CM | POA: Diagnosis not present

## 2020-01-01 DIAGNOSIS — N184 Chronic kidney disease, stage 4 (severe): Secondary | ICD-10-CM | POA: Diagnosis not present

## 2020-01-14 ENCOUNTER — Other Ambulatory Visit: Payer: Self-pay

## 2020-01-14 ENCOUNTER — Encounter: Payer: Self-pay | Admitting: Internal Medicine

## 2020-01-14 ENCOUNTER — Ambulatory Visit (INDEPENDENT_AMBULATORY_CARE_PROVIDER_SITE_OTHER): Payer: Medicare HMO | Admitting: Internal Medicine

## 2020-01-14 VITALS — BP 157/66 | HR 53 | Temp 97.1°F | Ht 72.0 in | Wt 179.0 lb

## 2020-01-14 DIAGNOSIS — R1310 Dysphagia, unspecified: Secondary | ICD-10-CM | POA: Diagnosis not present

## 2020-01-14 DIAGNOSIS — K219 Gastro-esophageal reflux disease without esophagitis: Secondary | ICD-10-CM | POA: Diagnosis not present

## 2020-01-14 NOTE — Progress Notes (Signed)
Referring Provider: Karsten Ro, DO Primary Care Physician:  Karsten Ro, DO Primary GI:  Dr. Abbey Chatters  Chief Complaint  Patient presents with  . Dysphagia    doing some better now  . weight loss    20 lbs in 2-3 months    HPI:   [name] is a 86 y.o. male who presents to the clinic today for follow-up visit.  He was previously seen by Dr. Oneida Alar over a year ago.  At that point he was having dysphagia.  He underwent EGD and was found to have esophageal candidiasis.  He was treated with Diflucan and states his symptoms improved.  He had worsening dysphagia and weight loss a few months ago but states this is also improved.  He believes that his symptoms are brought on by his Synthroid which has since been decreased.  Denies any active dysphagia or odynophagia for me today.  States he is eating much better.  Has intermittent reflux which is well controlled on pantoprazole daily.  Otherwise he has no other complaints.  Patient is accompanied by his wife Carla Drape today in clinic.  Past Medical History:  Diagnosis Date  . AAA (abdominal aortic aneurysm) ([place])   . Arthritis   . Blood clot in vein    at time of AAA surgery, requiring toe amputation  . Cancer ([place])    skin lesions removed  . Chronic kidney disease    Renal insufficiency Dr. Ray Church  . Coronary artery disease   . GERD (gastroesophageal reflux disease)   . Hemorrhoid   . Hypertension    Dr. Rory Percy, [place]  . Pneumonia    hx of pneumonia  . Thyroid disease     Past Surgical History:  Procedure Laterality Date  . ABDOMINAL AORTIC ANEURYSM REPAIR  12/05/2011   Procedure: ANEURYSM ABDOMINAL AORTIC REPAIR;  Surgeon: Mal Misty, MD;  Location: [facility] OR;  Service: Vascular;  Laterality: N/A;  Resection and Grafting of Abdominal Aortic Aneurysm using  18x2mm x 40cm Hemashielpd Gold Vascular Graft  . ABDOMINAL AORTIC ANEURYSM REPAIR    . AMPUTATION  02/20/2012   Procedure: AMPUTATION DIGIT;  Surgeon: Newt Minion, MD;  Location: [place];  Service: Orthopedics;  Laterality: Right;  Amputation Toes 1-4 Right Foot  . ARTERY EXPLORATION  12/05/2011   Procedure: ARTERY EXPLORATION;  Surgeon: Mal Misty, MD;  Location: [facility] OR;  Service: Vascular;  Laterality: Right;  Right Popliteal Artery Exploration  . EMBOLECTOMY  12/05/2011   Procedure: EMBOLECTOMY;  Surgeon: Mal Misty, MD;  Location: [place];  Service: Vascular;  Laterality: Right;  . EMBOLECTOMY  12/05/2011   Procedure: EMBOLECTOMY;  Surgeon: Mal Misty, MD;  Location: [place];  Service: Vascular;  Laterality: Right;  Thrombectomy of right anterior/posterior Tibial Arterys with vein patch angioplasty of tibial/peroneal trunk.  . ESOPHAGEAL BRUSHING  07/11/2018   Procedure: ESOPHAGEAL BRUSHING;  Surgeon: Danie Binder, MD;  [facility];  Service: Endoscopy;;  . ESOPHAGOGASTRODUODENOSCOPY N/A 07/11/2018   Procedure: ESOPHAGOGASTRODUODENOSCOPY (EGD);  Surgeon: Danie Binder, MD;  [facility];  Service: Endoscopy;  Laterality: N/A;  10:30am  . EYE SURGERY  ~ 1 year   cataract  . FOREIGN BODY REMOVAL  07/11/2018   Procedure: FOREIGN BODY REMOVAL;  Surgeon: Danie Binder, MD;  [facility];  Service: Endoscopy;;  . INTRAMEDULLARY (IM) NAIL INTERTROCHANTERIC Left 11/23/2018   Procedure: INTRAMEDULLARY (IM) NAIL INTERTROCHANTRIC;  Surgeon: Doran Durand,  Jenny Reichmann, MD;  Location: [place];  Service: Orthopedics;  Laterality: Left;  . INTRAOPERATIVE ARTERIOGRAM  12/05/2011   Procedure: INTRA OPERATIVE ARTERIOGRAM;  Surgeon: Mal Misty, MD;  Location: [place];  Service: Vascular;  Laterality: Right;  . MOUTH SURGERY      Current Outpatient Medications  Medication Sig Dispense Refill  . amLODipine (NORVASC) 10 MG tablet Take 10 mg by mouth daily.    . fish oil-omega-3 fatty acids 1000 MG capsule Take 2 g by mouth daily.    . furosemide (LASIX) 20 MG tablet Take 20 mg by mouth every Monday, Wednesday, and Friday.     .  levothyroxine (SYNTHROID, LEVOTHROID) 150 MCG tablet Take 125 mcg by mouth daily before breakfast.     . Multiple Vitamin (MULTIVITAMIN) tablet Take 1 tablet by mouth daily.    . multivitamin-lutein (OCUVITE-LUTEIN) CAPS capsule Take 1 capsule by mouth daily.    . pantoprazole (PROTONIX) 40 MG tablet Take 1 tablet by mouth daily.     No current facility-administered medications for this visit.    Allergies as of 01/14/2020  . (No Known Allergies)    Family History  Problem Relation Age of Onset  . Coronary artery disease Mother   . Kidney disease Mother   . Heart attack Mother   . Stroke Father   . Hypertension Father   . Colon cancer Neg Hx   . Colon polyps Neg Hx     Social History   Socioeconomic History  . Marital status: Married    Spouse name: Not on file  . Number of children: Not on file  . Years of education: Not on file  . Highest education level: Not on file  Occupational History  . Not on file  Tobacco Use  . Smoking status: Former Smoker    Packs/day: 1.00    Types: Cigarettes    Quit date: 07/22/1973    Years since quitting: 46.5  . Smokeless tobacco: Never Used  Vaping Use  . Vaping Use: Never used  Substance and Sexual Activity  . Alcohol use: No    Alcohol/week: 0.0 standard drinks  . Drug use: No  . Sexual activity: Not on file  Other Topics Concern  . 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   Social Determinants of Health   Financial Resource Strain:   . Difficulty of Paying Living Expenses: Not on file  Food Insecurity:   . Worried About Charity fundraiser in the Last Year: Not on file  . Ran Out of Food in the Last Year: Not on file  Transportation Needs:   . Lack of Transportation (Medical): Not on file  . Lack of Transportation (Non-Medical): Not on file  Physical Activity:   . Days of Exercise per Week: Not on file  . Minutes of Exercise per Session: Not on file   Stress:   . Feeling of Stress : Not on file  Social Connections:   . Frequency of Communication with Friends and Family: Not on file  . Frequency of Social Gatherings with Friends and Family: Not on file  . Attends Religious Services: Not on file  . Active Member of Clubs or Organizations: Not on file  . Attends Archivist Meetings: Not on file  . Marital Status: Not on file    Subjective: Review of Systems  Constitutional: Negative for chills and fever.  HENT: Negative for  congestion and hearing loss.   Eyes: Negative for blurred vision and double vision.  Respiratory: Negative for cough and shortness of breath.   Cardiovascular: Negative for chest pain and palpitations.  Gastrointestinal: Negative for abdominal pain, blood in stool, constipation, diarrhea, heartburn, melena and vomiting.  Genitourinary: Negative for dysuria and urgency.  Musculoskeletal: Negative for joint pain and myalgias.  Skin: Negative for itching and rash.  Neurological: Negative for dizziness and headaches.  Psychiatric/Behavioral: Negative for depression. The patient is not nervous/anxious.      Objective: BP (!) 157/66   Pulse (!) 53   Temp (!) 97.1 F (36.2 C) (Oral)   Ht 6' (1.829 m)   Wt 179 lb (81.2 kg)   BMI 24.28 kg/m  Physical Exam Constitutional:      Appearance: Normal appearance.  HENT:     Head: Normocephalic and atraumatic.  Eyes:     Extraocular Movements: Extraocular movements intact.     Conjunctiva/sclera: Conjunctivae normal.  Cardiovascular:     Rate and Rhythm: Normal rate and regular rhythm.  Pulmonary:     Effort: Pulmonary effort is normal.     Breath sounds: Normal breath sounds.  Abdominal:     General: Bowel sounds are normal.     Palpations: Abdomen is soft.  Musculoskeletal:        General: Normal range of motion.     Cervical back: Normal range of motion and neck supple.  Skin:    General: Skin is warm.  Neurological:     General: No focal  deficit present.     Mental Status: He is alert and oriented to person, place, and time.  Psychiatric:        Mood and Affect: Mood normal.        Behavior: Behavior normal.      Assessment: *Dysphagia-much improved *Chronic reflux-well-controlled on pantoprazole *History of candidal esophagitis-resolved  Plan: Patient appears to be stable from a GI standpoint.  We will continue on Protonix 40 mg daily.  I have printed them out safe reflux practices for home.  Patient told to call office if he has a recurrence of his dysphagia.  We may need to repeat EGD if this happens.  Otherwise he can follow-up with GI as needed.  01/14/2020 2:00 PM   Disclaimer: This note was dictated with voice recognition software. Similar sounding words can inadvertently be transcribed and may not be corrected upon review.

## 2020-01-14 NOTE — Patient Instructions (Signed)
We will continue you on Protonix for your reflux/heartburn.  If you develop worsening issues with swallowing please call our office and we will address them.   Follow-up with GI as needed.  Lifestyle and home remedies TO MANAGE REFLUX/HEARTBURN    You may eliminate or reduce the frequency of heartburn by making the following lifestyle changes:    Control your weight. Being overweight is a major risk factor for heartburn and GERD. Excess pounds put pressure on your abdomen, pushing up your stomach and causing acid to back up into your esophagus.     Eat smaller meals. 4 TO 6 MEALS A DAY. This reduces pressure on the lower esophageal sphincter, helping to prevent the valve from opening and acid from washing back into your esophagus.      Loosen your belt. Clothes that fit tightly around your waist put pressure on your abdomen and the lower esophageal sphincter.      Eliminate heartburn triggers. Everyone has specific triggers. Common triggers such as fatty or fried foods, spicy food, tomato sauce, carbonated beverages, alcohol, chocolate, mint, garlic, onion, caffeine and nicotine may make heartburn worse.     Avoid stooping or bending. Tying your shoes is OK. Bending over for longer periods to weed your garden isn't, especially soon after eating.     Don't lie down after a meal. Wait at least three to four hours after eating before going to bed, and don't lie down right after eating.    At [facility] we value your feedback. You may receive a survey about your visit today. Please share your experience as we strive to create trusting relationships with our patients to provide genuine, compassionate, quality care.  We appreciate your understanding and patience as we review any laboratory studies, imaging, and other diagnostic tests that are ordered as we care for you. Our office policy is 5 business days for review of these results, and any emergent or urgent results are  addressed in a timely manner for your best interest. If you do not hear from our office in 1 week, please contact us.   We also encourage the use of MyChart, which contains your medical information for your review as well. If you are not enrolled in this feature, an access code is on this after visit summary for your convenience. Thank you for allowing [place] to be involved in your care.  It was great to see you today!  I hope you have a great rest of your fall.  Thank you and your wife Carla Drape for serving our country.   Elon Alas. Abbey Chatters, D.O. Gastroenterology and Hepatology [facility]

## 2020-01-21 DIAGNOSIS — N1832 Chronic kidney disease, stage 3b: Secondary | ICD-10-CM | POA: Diagnosis not present

## 2020-01-21 DIAGNOSIS — I129 Hypertensive chronic kidney disease with stage 1 through stage 4 chronic kidney disease, or unspecified chronic kidney disease: Secondary | ICD-10-CM | POA: Diagnosis not present

## 2020-01-21 DIAGNOSIS — E038 Other specified hypothyroidism: Secondary | ICD-10-CM | POA: Diagnosis not present

## 2020-02-20 DIAGNOSIS — N1832 Chronic kidney disease, stage 3b: Secondary | ICD-10-CM | POA: Diagnosis not present

## 2020-02-20 DIAGNOSIS — E038 Other specified hypothyroidism: Secondary | ICD-10-CM | POA: Diagnosis not present

## 2020-02-20 DIAGNOSIS — I129 Hypertensive chronic kidney disease with stage 1 through stage 4 chronic kidney disease, or unspecified chronic kidney disease: Secondary | ICD-10-CM | POA: Diagnosis not present

## 2020-03-03 DIAGNOSIS — Z6825 Body mass index (BMI) 25.0-25.9, adult: Secondary | ICD-10-CM | POA: Diagnosis not present

## 2020-03-03 DIAGNOSIS — N184 Chronic kidney disease, stage 4 (severe): Secondary | ICD-10-CM | POA: Diagnosis not present

## 2020-03-03 DIAGNOSIS — S8292XD Unspecified fracture of left lower leg, subsequent encounter for closed fracture with routine healing: Secondary | ICD-10-CM | POA: Diagnosis not present

## 2020-03-03 DIAGNOSIS — Z89411 Acquired absence of right great toe: Secondary | ICD-10-CM | POA: Diagnosis not present

## 2020-03-03 DIAGNOSIS — Z9989 Dependence on other enabling machines and devices: Secondary | ICD-10-CM | POA: Diagnosis not present

## 2020-03-03 DIAGNOSIS — E039 Hypothyroidism, unspecified: Secondary | ICD-10-CM | POA: Diagnosis not present

## 2020-03-03 DIAGNOSIS — Z89421 Acquired absence of other right toe(s): Secondary | ICD-10-CM | POA: Diagnosis not present

## 2020-03-03 DIAGNOSIS — D692 Other nonthrombocytopenic purpura: Secondary | ICD-10-CM | POA: Diagnosis not present

## 2020-03-31 DIAGNOSIS — N184 Chronic kidney disease, stage 4 (severe): Secondary | ICD-10-CM | POA: Diagnosis not present

## 2020-03-31 DIAGNOSIS — D631 Anemia in chronic kidney disease: Secondary | ICD-10-CM | POA: Diagnosis not present

## 2020-03-31 DIAGNOSIS — N189 Chronic kidney disease, unspecified: Secondary | ICD-10-CM | POA: Diagnosis not present

## 2020-03-31 DIAGNOSIS — I129 Hypertensive chronic kidney disease with stage 1 through stage 4 chronic kidney disease, or unspecified chronic kidney disease: Secondary | ICD-10-CM | POA: Diagnosis not present

## 2020-03-31 DIAGNOSIS — R809 Proteinuria, unspecified: Secondary | ICD-10-CM | POA: Diagnosis not present

## 2020-04-07 DIAGNOSIS — N184 Chronic kidney disease, stage 4 (severe): Secondary | ICD-10-CM | POA: Diagnosis not present

## 2020-04-07 DIAGNOSIS — R809 Proteinuria, unspecified: Secondary | ICD-10-CM | POA: Diagnosis not present

## 2020-04-07 DIAGNOSIS — D631 Anemia in chronic kidney disease: Secondary | ICD-10-CM | POA: Diagnosis not present

## 2020-04-07 DIAGNOSIS — N189 Chronic kidney disease, unspecified: Secondary | ICD-10-CM | POA: Diagnosis not present

## 2020-04-07 DIAGNOSIS — I129 Hypertensive chronic kidney disease with stage 1 through stage 4 chronic kidney disease, or unspecified chronic kidney disease: Secondary | ICD-10-CM | POA: Diagnosis not present

## 2020-04-28 DIAGNOSIS — Z23 Encounter for immunization: Secondary | ICD-10-CM | POA: Diagnosis not present

## 2020-05-04 DIAGNOSIS — D631 Anemia in chronic kidney disease: Secondary | ICD-10-CM | POA: Diagnosis not present

## 2020-05-04 DIAGNOSIS — R809 Proteinuria, unspecified: Secondary | ICD-10-CM | POA: Diagnosis not present

## 2020-05-04 DIAGNOSIS — N189 Chronic kidney disease, unspecified: Secondary | ICD-10-CM | POA: Diagnosis not present

## 2020-05-04 DIAGNOSIS — I129 Hypertensive chronic kidney disease with stage 1 through stage 4 chronic kidney disease, or unspecified chronic kidney disease: Secondary | ICD-10-CM | POA: Diagnosis not present

## 2020-05-04 DIAGNOSIS — N184 Chronic kidney disease, stage 4 (severe): Secondary | ICD-10-CM | POA: Diagnosis not present

## 2020-05-11 DIAGNOSIS — N184 Chronic kidney disease, stage 4 (severe): Secondary | ICD-10-CM | POA: Diagnosis not present

## 2020-05-11 DIAGNOSIS — N189 Chronic kidney disease, unspecified: Secondary | ICD-10-CM | POA: Diagnosis not present

## 2020-05-11 DIAGNOSIS — D631 Anemia in chronic kidney disease: Secondary | ICD-10-CM | POA: Diagnosis not present

## 2020-05-11 DIAGNOSIS — R809 Proteinuria, unspecified: Secondary | ICD-10-CM | POA: Diagnosis not present

## 2020-05-11 DIAGNOSIS — I129 Hypertensive chronic kidney disease with stage 1 through stage 4 chronic kidney disease, or unspecified chronic kidney disease: Secondary | ICD-10-CM | POA: Diagnosis not present

## 2020-05-16 ENCOUNTER — Encounter: Payer: Self-pay | Admitting: Internal Medicine

## 2020-05-16 DIAGNOSIS — R634 Abnormal weight loss: Secondary | ICD-10-CM | POA: Diagnosis not present

## 2020-05-16 DIAGNOSIS — R5382 Chronic fatigue, unspecified: Secondary | ICD-10-CM | POA: Diagnosis not present

## 2020-05-16 DIAGNOSIS — D649 Anemia, unspecified: Secondary | ICD-10-CM | POA: Diagnosis not present

## 2020-05-16 DIAGNOSIS — N184 Chronic kidney disease, stage 4 (severe): Secondary | ICD-10-CM | POA: Diagnosis not present

## 2020-05-16 DIAGNOSIS — Z79899 Other long term (current) drug therapy: Secondary | ICD-10-CM | POA: Diagnosis not present

## 2020-05-16 DIAGNOSIS — Z6824 Body mass index (BMI) 24.0-24.9, adult: Secondary | ICD-10-CM | POA: Diagnosis not present

## 2020-05-16 DIAGNOSIS — R630 Anorexia: Secondary | ICD-10-CM | POA: Diagnosis not present

## 2020-05-17 ENCOUNTER — Ambulatory Visit: Payer: Medicare HMO | Admitting: Gastroenterology

## 2020-05-21 DIAGNOSIS — N1832 Chronic kidney disease, stage 3b: Secondary | ICD-10-CM | POA: Diagnosis not present

## 2020-05-21 DIAGNOSIS — I129 Hypertensive chronic kidney disease with stage 1 through stage 4 chronic kidney disease, or unspecified chronic kidney disease: Secondary | ICD-10-CM | POA: Diagnosis not present

## 2020-05-21 DIAGNOSIS — E038 Other specified hypothyroidism: Secondary | ICD-10-CM | POA: Diagnosis not present

## 2020-05-31 DIAGNOSIS — D518 Other vitamin B12 deficiency anemias: Secondary | ICD-10-CM | POA: Diagnosis not present

## 2020-05-31 DIAGNOSIS — D528 Other folate deficiency anemias: Secondary | ICD-10-CM | POA: Diagnosis not present

## 2020-05-31 DIAGNOSIS — N184 Chronic kidney disease, stage 4 (severe): Secondary | ICD-10-CM | POA: Diagnosis not present

## 2020-05-31 DIAGNOSIS — Z859 Personal history of malignant neoplasm, unspecified: Secondary | ICD-10-CM | POA: Diagnosis not present

## 2020-05-31 DIAGNOSIS — D649 Anemia, unspecified: Secondary | ICD-10-CM | POA: Diagnosis not present

## 2020-06-15 DIAGNOSIS — N184 Chronic kidney disease, stage 4 (severe): Secondary | ICD-10-CM | POA: Diagnosis not present

## 2020-06-15 DIAGNOSIS — R634 Abnormal weight loss: Secondary | ICD-10-CM | POA: Diagnosis not present

## 2020-06-15 DIAGNOSIS — D649 Anemia, unspecified: Secondary | ICD-10-CM | POA: Diagnosis not present

## 2020-06-15 DIAGNOSIS — Z6824 Body mass index (BMI) 24.0-24.9, adult: Secondary | ICD-10-CM | POA: Diagnosis not present

## 2020-06-15 DIAGNOSIS — R630 Anorexia: Secondary | ICD-10-CM | POA: Diagnosis not present

## 2020-06-20 DIAGNOSIS — E038 Other specified hypothyroidism: Secondary | ICD-10-CM | POA: Diagnosis not present

## 2020-06-20 DIAGNOSIS — I129 Hypertensive chronic kidney disease with stage 1 through stage 4 chronic kidney disease, or unspecified chronic kidney disease: Secondary | ICD-10-CM | POA: Diagnosis not present

## 2020-06-20 DIAGNOSIS — N1832 Chronic kidney disease, stage 3b: Secondary | ICD-10-CM | POA: Diagnosis not present

## 2020-06-30 DIAGNOSIS — R918 Other nonspecific abnormal finding of lung field: Secondary | ICD-10-CM | POA: Diagnosis not present

## 2020-06-30 DIAGNOSIS — J449 Chronic obstructive pulmonary disease, unspecified: Secondary | ICD-10-CM | POA: Diagnosis not present

## 2020-06-30 DIAGNOSIS — K8689 Other specified diseases of pancreas: Secondary | ICD-10-CM | POA: Diagnosis not present

## 2020-06-30 DIAGNOSIS — I2510 Atherosclerotic heart disease of native coronary artery without angina pectoris: Secondary | ICD-10-CM | POA: Diagnosis not present

## 2020-06-30 DIAGNOSIS — K222 Esophageal obstruction: Secondary | ICD-10-CM | POA: Diagnosis not present

## 2020-06-30 DIAGNOSIS — K8020 Calculus of gallbladder without cholecystitis without obstruction: Secondary | ICD-10-CM | POA: Diagnosis not present

## 2020-06-30 DIAGNOSIS — J439 Emphysema, unspecified: Secondary | ICD-10-CM | POA: Diagnosis not present

## 2020-06-30 DIAGNOSIS — R1310 Dysphagia, unspecified: Secondary | ICD-10-CM | POA: Diagnosis not present

## 2020-06-30 DIAGNOSIS — R634 Abnormal weight loss: Secondary | ICD-10-CM | POA: Diagnosis not present

## 2020-06-30 DIAGNOSIS — I700 Atherosclerosis of aorta: Secondary | ICD-10-CM | POA: Diagnosis not present

## 2020-06-30 DIAGNOSIS — K439 Ventral hernia without obstruction or gangrene: Secondary | ICD-10-CM | POA: Diagnosis not present

## 2020-06-30 DIAGNOSIS — K838 Other specified diseases of biliary tract: Secondary | ICD-10-CM | POA: Diagnosis not present

## 2020-07-01 ENCOUNTER — Other Ambulatory Visit: Payer: Self-pay

## 2020-07-01 ENCOUNTER — Ambulatory Visit (INDEPENDENT_AMBULATORY_CARE_PROVIDER_SITE_OTHER): Payer: Medicare HMO | Admitting: Gastroenterology

## 2020-07-01 ENCOUNTER — Encounter: Payer: Self-pay | Admitting: *Deleted

## 2020-07-01 ENCOUNTER — Telehealth: Payer: Self-pay | Admitting: *Deleted

## 2020-07-01 ENCOUNTER — Encounter: Payer: Self-pay | Admitting: Gastroenterology

## 2020-07-01 VITALS — BP 102/56 | HR 68 | Temp 96.8°F | Ht 71.0 in | Wt 167.2 lb

## 2020-07-01 DIAGNOSIS — K219 Gastro-esophageal reflux disease without esophagitis: Secondary | ICD-10-CM

## 2020-07-01 DIAGNOSIS — R634 Abnormal weight loss: Secondary | ICD-10-CM

## 2020-07-01 DIAGNOSIS — R6881 Early satiety: Secondary | ICD-10-CM

## 2020-07-01 DIAGNOSIS — R1310 Dysphagia, unspecified: Secondary | ICD-10-CM

## 2020-07-01 NOTE — Telephone Encounter (Signed)
PA approved via [employer]. Auth# [id number] DOS 07/15/2020-08/14/2020

## 2020-07-01 NOTE — Patient Instructions (Signed)
1. Continue pantoprazole 40 mg daily before breakfast. 2. I will follow-up on CT scan results as available. 3. Upper endoscopy as scheduled.  Please see separate instructions.

## 2020-07-01 NOTE — Progress Notes (Signed)
Primary Care Physician:  Hugoton Nation, MD  Primary Gastroenterologist:  Elon Alas. Abbey Chatters, DO   Chief Complaint  Patient presents with  . Weight Loss    Lost approx 30 lbs in past year. Had CT at [facility] yesterday  . loss of appetite    Doesn't take much to fill up    HPI:  [name] is a 87 y.o. male with history of thyroid disease, hypertension, GERD, CAD, chronic kidney disease presenting for further evaluation of weight loss, early satiety.  Last seen September 2021.  At that time he did not complain of dysphagia.  He had noted some weight loss which she felt was related to Synthroid.  Presents today at request of Dr. Jimmye Norman for further evaluation weight loss, early satiety, dysphagia.  Since before Christmas no appetite. Gets full very quickly. Can only eat one meal per day and only small amounts. When he eats, feels like food is sticking in center of chest/epigastric region. No vomiting. No abdominal pain. States his thyroid medication was adjusted about 3 months ago, dose decreased. Complains of fatigue/lack of energy. Weight continues to decline. BMs regular. No melena, brbpr.   07/01/2020: 167 pounds 05/16/20 at PCP: 178 pounds 01/14/2020: 179 pounds 11/23/2018: 195 pounds 05/26/2016: 200 pounds  EGD for food impaction July 11, 2018: Food in the mid esophagus. Removal was successful. - DYSPHAGIA DUE TO CANDIDA ESOPHAGITIS, KOH prep positive, treated with Diflucan. - Gastritis. Biopsied, benign and negative for H. pylori.  Labs from May 04, 2020: Iron 50, TIBC 168, iron saturation is 30%, hemoglobin 9.1, hematocrit 26.4, MCV 108, white blood cell count 6300, platelet 166,000, glucose 138, BUN 30, creatinine 2.22, sodium 143, potassium 4.2, albumin 3.7, estimated GFR 25  Labs from 05/31/20: iron 70, TIBC 173, folate >20, B12 765, glucose 197, BUN 23, Cre 1.80, alb 3.7, Tbili 0.4, AP 221 H, AST 39, ALT 54H, WBC 5900, H/H 9.2/27.5L, MCV 110H, Platelets  118000   Current Outpatient Medications  Medication Sig Dispense Refill  . amLODipine (NORVASC) 10 MG tablet Take 10 mg by mouth daily.    . fish oil-omega-3 fatty acids 1000 MG capsule Take 2 g by mouth daily.    . furosemide (LASIX) 40 MG tablet Take 20 mg by mouth daily.    Marland Kitchen levothyroxine (SYNTHROID, LEVOTHROID) 150 MCG tablet Take 125 mcg by mouth daily before breakfast.     . Multiple Vitamin (MULTIVITAMIN) tablet Take 1 tablet by mouth daily.    . multivitamin-lutein (OCUVITE-LUTEIN) CAPS capsule Take 1 capsule by mouth daily.    . pantoprazole (PROTONIX) 40 MG tablet Take 1 tablet by mouth daily.     No current facility-administered medications for this visit.    Allergies as of 07/01/2020  . (No Known Allergies)    Past Medical History:  Diagnosis Date  . AAA (abdominal aortic aneurysm) ([place])   . Arthritis   . Blood clot in vein    at time of AAA surgery, requiring toe amputation  . Cancer ([place])    skin lesions removed  . Chronic kidney disease    Renal insufficiency Dr. Ray Church  . Coronary artery disease   . GERD (gastroesophageal reflux disease)   . Hemorrhoid   . Hypertension    Dr. Rory Percy, [place]  . Pneumonia    hx of pneumonia  . Thyroid disease     Past Surgical History:  Procedure Laterality Date  . ABDOMINAL AORTIC ANEURYSM REPAIR  12/05/2011   Procedure: ANEURYSM ABDOMINAL  AORTIC REPAIR;  Surgeon: Mal Misty, MD;  Location: [facility] OR;  Service: Vascular;  Laterality: N/A;  Resection and Grafting of Abdominal Aortic Aneurysm using  18x50mm x 40cm Hemashielpd Gold Vascular Graft  . ABDOMINAL AORTIC ANEURYSM REPAIR    . AMPUTATION  02/20/2012   Procedure: AMPUTATION DIGIT;  Surgeon: Newt Minion, MD;  Location: [place];  Service: Orthopedics;  Laterality: Right;  Amputation Toes 1-4 Right Foot  . ARTERY EXPLORATION  12/05/2011   Procedure: ARTERY EXPLORATION;  Surgeon: Mal Misty, MD;  Location: [facility] OR;  Service: Vascular;  Laterality: Right;  Right  Popliteal Artery Exploration  . EMBOLECTOMY  12/05/2011   Procedure: EMBOLECTOMY;  Surgeon: Mal Misty, MD;  Location: [place];  Service: Vascular;  Laterality: Right;  . EMBOLECTOMY  12/05/2011   Procedure: EMBOLECTOMY;  Surgeon: Mal Misty, MD;  Location: [place];  Service: Vascular;  Laterality: Right;  Thrombectomy of right anterior/posterior Tibial Arterys with vein patch angioplasty of tibial/peroneal trunk.  . ESOPHAGEAL BRUSHING  07/11/2018   Procedure: ESOPHAGEAL BRUSHING;  Surgeon: Danie Binder, MD;  [facility];  Service: Endoscopy;;  . ESOPHAGOGASTRODUODENOSCOPY N/A 07/11/2018   Procedure: ESOPHAGOGASTRODUODENOSCOPY (EGD);  Surgeon: Danie Binder, MD;  [facility];  Service: Endoscopy;  Laterality: N/A;  10:30am  . EYE SURGERY  ~ 1 year   cataract  . FOREIGN BODY REMOVAL  07/11/2018   Procedure: FOREIGN BODY REMOVAL;  Surgeon: Danie Binder, MD;  [facility];  Service: Endoscopy;;  . INTRAMEDULLARY (IM) NAIL INTERTROCHANTERIC Left 11/23/2018   Procedure: INTRAMEDULLARY (IM) NAIL INTERTROCHANTRIC;  Surgeon: Wylene Simmer, MD;  Location: [place];  Service: Orthopedics;  Laterality: Left;  . INTRAOPERATIVE ARTERIOGRAM  12/05/2011   Procedure: INTRA OPERATIVE ARTERIOGRAM;  Surgeon: Mal Misty, MD;  Location: [place];  Service: Vascular;  Laterality: Right;  . MOUTH SURGERY      Family History  Problem Relation Age of Onset  . Coronary artery disease Mother   . Kidney disease Mother   . Heart attack Mother   . Stroke Father   . Hypertension Father   . Colon cancer Neg Hx   . Colon polyps Neg Hx     Social History   Socioeconomic History  . Marital status: Married    Spouse name: Not on file  . Number of children: Not on file  . Years of education: Not on file  . Highest education level: Not on file  Occupational History  . Not on file  Tobacco Use  . Smoking status: Former Smoker    Packs/day: 1.00    Types: Cigarettes    Quit  date: 07/22/1973    Years since quitting: 46.9  . Smokeless tobacco: Never Used  Vaping Use  . Vaping Use: Never used  Substance and Sexual Activity  . Alcohol use: No    Alcohol/week: 0.0 standard drinks  . Drug use: No  . Sexual activity: Not on file  Other Topics Concern  . 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   Social Determinants of Health   Financial Resource Strain: Not on file  Food Insecurity: Not on file  Transportation Needs: Not on file  Physical Activity: Not on file  Stress: Not on file  Social Connections: Not on file  Intimate Partner Violence: Not on file      ROS:  General: Negative for  fever, chills. Complains of anorexia. See hpi. Eyes: Negative for vision changes.  ENT: Negative for hoarseness, nasal congestion. See hpi CV: Negative for chest pain, angina, palpitations, dyspnea on exertion, peripheral edema.  Respiratory: Negative for dyspnea at rest, dyspnea on exertion, cough, sputum, wheezing.  GI: See history of present illness. GU:  Negative for dysuria, hematuria, urinary incontinence, urinary frequency, nocturnal urination.  MS: Negative for joint pain, low back pain.  Derm: Negative for rash or itching.  Neuro: Negative for weakness, abnormal sensation, seizure, frequent headaches, memory loss, confusion.  Psych: Negative for anxiety, depression, suicidal ideation, hallucinations.  Endo: Negative for unusual weight change.  Heme: Negative for bruising or bleeding. Allergy: Negative for rash or hives.    Physical Examination:  BP (!) 102/56   Pulse 68   Temp (!) 96.8 F (36 C) (Temporal)   Ht 5\' 11"  (1.803 m)   Wt 167 lb 3.2 oz (75.8 kg)   BMI 23.32 kg/m    General: Well-nourished, well-developed in no acute distress. Accompanied by son.  Head: Normocephalic, atraumatic.   Eyes: Conjunctiva pale, no icterus. Mouth: masked Neck: Supple without thyromegaly, masses, or  lymphadenopathy.  Lungs: Clear to auscultation bilaterally.  Heart: Regular rate and rhythm, no murmurs rubs or gallops.  Abdomen: Bowel sounds are normal, nontender, nondistended, no hepatosplenomegaly or masses, no abdominal bruits or    hernia , no rebound or guarding.   Rectal: not performed Extremities: No lower extremity edema. No clubbing or deformities.  Neuro: Alert and oriented x 4 , grossly normal neurologically.  Skin: Warm and dry, no rash or jaundice.  Pale. Psych: Alert and cooperative, normal mood and affect.  Labs: See hpi  Imaging Studies: No results found.  Assessment/Plan:  87 y/o male with thyroid disease, HTN, CKD, CAD presents for further evaluation of abnormal weight loss, early satiety, poor appetite, esophageal dysphagia.   Patient has history of new onset dysphagia evaluated in 06/2018 and at time of EGD noted to have food in the esophagus which had to be removed, he had candida esophagitis, and gastritis. Esophagus not stretched at the time. He did not follow up for possible EGD if needed. Seen in office 12/2019 reporting improved dysphagia.   Now presenting with concern for weight loss over the past several months. Overall down 30 pounds since 05/2016. He has dropped 20 pounds since 11/2018 and 10 pounds down since 06/2020. He had CT chest/abd/pelvis yesterday but report not available. Given concerns for early satiety, esophageal dysphagia would offer EGD/ED in the near future with Dr. Abbey Chatters. He may have esophageal stricture, PUD, malignancy.  ASA II.  I have discussed the risks, alternatives, benefits with regards to but not limited to the risk of reaction to medication, bleeding, infection, perforation and the patient is agreeable to proceed. Written consent to be obtained.   1. Continue pantoprazole 40 mg daily before breakfast. 2. I will follow-up on CT scan results as available. 3. EGD/ED with Dr. Abbey Chatters.

## 2020-07-04 DIAGNOSIS — N184 Chronic kidney disease, stage 4 (severe): Secondary | ICD-10-CM | POA: Diagnosis not present

## 2020-07-04 DIAGNOSIS — I739 Peripheral vascular disease, unspecified: Secondary | ICD-10-CM | POA: Diagnosis not present

## 2020-07-04 DIAGNOSIS — K219 Gastro-esophageal reflux disease without esophagitis: Secondary | ICD-10-CM | POA: Diagnosis not present

## 2020-07-04 DIAGNOSIS — Z89411 Acquired absence of right great toe: Secondary | ICD-10-CM | POA: Diagnosis not present

## 2020-07-04 DIAGNOSIS — I129 Hypertensive chronic kidney disease with stage 1 through stage 4 chronic kidney disease, or unspecified chronic kidney disease: Secondary | ICD-10-CM | POA: Diagnosis not present

## 2020-07-04 DIAGNOSIS — Z89421 Acquired absence of other right toe(s): Secondary | ICD-10-CM | POA: Diagnosis not present

## 2020-07-04 DIAGNOSIS — E039 Hypothyroidism, unspecified: Secondary | ICD-10-CM | POA: Diagnosis not present

## 2020-07-04 DIAGNOSIS — D692 Other nonthrombocytopenic purpura: Secondary | ICD-10-CM | POA: Diagnosis not present

## 2020-07-04 DIAGNOSIS — I714 Abdominal aortic aneurysm, without rupture: Secondary | ICD-10-CM | POA: Diagnosis not present

## 2020-07-06 DIAGNOSIS — Z6822 Body mass index (BMI) 22.0-22.9, adult: Secondary | ICD-10-CM | POA: Diagnosis not present

## 2020-07-06 DIAGNOSIS — K8689 Other specified diseases of pancreas: Secondary | ICD-10-CM | POA: Diagnosis not present

## 2020-07-06 DIAGNOSIS — R634 Abnormal weight loss: Secondary | ICD-10-CM | POA: Diagnosis not present

## 2020-07-06 DIAGNOSIS — D649 Anemia, unspecified: Secondary | ICD-10-CM | POA: Diagnosis not present

## 2020-07-06 DIAGNOSIS — D631 Anemia in chronic kidney disease: Secondary | ICD-10-CM | POA: Diagnosis not present

## 2020-07-06 DIAGNOSIS — N184 Chronic kidney disease, stage 4 (severe): Secondary | ICD-10-CM | POA: Diagnosis not present

## 2020-07-06 DIAGNOSIS — N189 Chronic kidney disease, unspecified: Secondary | ICD-10-CM | POA: Diagnosis not present

## 2020-07-06 DIAGNOSIS — R809 Proteinuria, unspecified: Secondary | ICD-10-CM | POA: Diagnosis not present

## 2020-07-06 DIAGNOSIS — I129 Hypertensive chronic kidney disease with stage 1 through stage 4 chronic kidney disease, or unspecified chronic kidney disease: Secondary | ICD-10-CM | POA: Diagnosis not present

## 2020-07-06 DIAGNOSIS — R630 Anorexia: Secondary | ICD-10-CM | POA: Diagnosis not present

## 2020-07-06 NOTE — Progress Notes (Signed)
CC'ED TO PCP 

## 2020-07-07 DIAGNOSIS — K8689 Other specified diseases of pancreas: Secondary | ICD-10-CM | POA: Diagnosis not present

## 2020-07-07 DIAGNOSIS — R64 Cachexia: Secondary | ICD-10-CM | POA: Diagnosis not present

## 2020-07-07 DIAGNOSIS — Z789 Other specified health status: Secondary | ICD-10-CM | POA: Diagnosis not present

## 2020-07-07 DIAGNOSIS — C250 Malignant neoplasm of head of pancreas: Secondary | ICD-10-CM | POA: Diagnosis not present

## 2020-07-07 DIAGNOSIS — R935 Abnormal findings on diagnostic imaging of other abdominal regions, including retroperitoneum: Secondary | ICD-10-CM | POA: Diagnosis not present

## 2020-07-11 DIAGNOSIS — C250 Malignant neoplasm of head of pancreas: Secondary | ICD-10-CM | POA: Diagnosis not present

## 2020-07-11 DIAGNOSIS — K8689 Other specified diseases of pancreas: Secondary | ICD-10-CM | POA: Diagnosis not present

## 2020-07-11 DIAGNOSIS — R54 Age-related physical debility: Secondary | ICD-10-CM | POA: Diagnosis not present

## 2020-07-11 DIAGNOSIS — Z7189 Other specified counseling: Secondary | ICD-10-CM | POA: Diagnosis not present

## 2020-07-11 DIAGNOSIS — R64 Cachexia: Secondary | ICD-10-CM | POA: Diagnosis not present

## 2020-07-12 ENCOUNTER — Telehealth: Payer: Self-pay | Admitting: Internal Medicine

## 2020-07-12 NOTE — Telephone Encounter (Signed)
Noted, fowarding to [place] as an [place]

## 2020-07-12 NOTE — Telephone Encounter (Signed)
[facility] called to cancel patient procedure, he is going to hospice

## 2020-07-12 NOTE — Telephone Encounter (Addendum)
Noted. Reviewed [facility] records. Chest/abd/pelvis CT with below findings. Concern for pancreatic cancer. Has seen oncology and electing Hospice care.   Impression: There is intra and extrahepatic biliary duct dilatation with  abrupt cutoff of the common bile duct in the region the pancreatic  head. There is relative atrophy of the pancreatic neck, body, and  tail with mild prominence of the pancreatic duct in these regions.  There is relative fullness in the pancreatic head. The constellation  of findings is concerning for a mass in the pancreatic head.  Recommend MRCP or ERCP for further evaluation.  2. Cholelithiasis. The gallbladder is distended, likely associated  with the intra and extrahepatic biliary duct dilatation.  3. There is narrowing in the distal 4 cm of the esophagus with  relative dilatation proximally. Given history, this may represent a  benign stricture. There is no definitive mass in this region.  Recommend endoscopy for further evaluation.  4. Multiple tiny pulmonary nodules are nonspecific. If the patient  is found to have pancreatic cancer, very early metastases are not  excluded.  5. Atherosclerotic changes in the aorta. Three-vessel coronary  artery disease.  6. Emphysema.  7. 2 fat containing ventral hernias.   These results will be called to the ordering clinician or  representative by the Radiologist Assistant, and communication  documented in the PACS or [employer].

## 2020-07-14 ENCOUNTER — Other Ambulatory Visit (HOSPITAL_COMMUNITY)
Admission: RE | Admit: 2020-07-14 | Discharge: 2020-07-14 | Disposition: A | Discharge status: Home / Self Care | Payer: Medicare HMO | Source: Ambulatory Visit | Attending: Internal Medicine | Admitting: Internal Medicine

## 2020-07-15 ENCOUNTER — Encounter (HOSPITAL_COMMUNITY): Admission: RE | Payer: Self-pay | Source: Home / Self Care

## 2020-07-15 ENCOUNTER — Ambulatory Visit (HOSPITAL_COMMUNITY): Admission: RE | Admit: 2020-07-15 | Payer: Medicare HMO | Source: Home / Self Care

## 2020-07-15 SURGERY — ESOPHAGOGASTRODUODENOSCOPY (EGD) WITH PROPOFOL
Anesthesia: Monitor Anesthesia Care

## 2020-07-20 DIAGNOSIS — E038 Other specified hypothyroidism: Secondary | ICD-10-CM | POA: Diagnosis not present

## 2020-07-20 DIAGNOSIS — N1832 Chronic kidney disease, stage 3b: Secondary | ICD-10-CM | POA: Diagnosis not present

## 2020-07-20 DIAGNOSIS — I129 Hypertensive chronic kidney disease with stage 1 through stage 4 chronic kidney disease, or unspecified chronic kidney disease: Secondary | ICD-10-CM | POA: Diagnosis not present

## 2020-09-21 DEATH — deceased
